# Patient Record
Sex: Male | Born: 1950 | Race: White | Hispanic: No | Marital: Married | Smoking: Former smoker
Health system: Southern US, Community
[De-identification: ages and names within clinical notes are randomized; demographics above are authoritative.]

## PROBLEM LIST (undated history)

## (undated) DIAGNOSIS — M109 Gout, unspecified: Secondary | ICD-10-CM

## (undated) DIAGNOSIS — J151 Pneumonia due to Pseudomonas: Secondary | ICD-10-CM

## (undated) DIAGNOSIS — C439 Malignant melanoma of skin, unspecified: Secondary | ICD-10-CM

## (undated) DIAGNOSIS — A419 Sepsis, unspecified organism: Secondary | ICD-10-CM

## (undated) DIAGNOSIS — G9341 Metabolic encephalopathy: Secondary | ICD-10-CM

## (undated) DIAGNOSIS — R6520 Severe sepsis without septic shock: Secondary | ICD-10-CM

## (undated) DIAGNOSIS — K8591 Acute pancreatitis with uninfected necrosis, unspecified: Secondary | ICD-10-CM

## (undated) DIAGNOSIS — I10 Essential (primary) hypertension: Secondary | ICD-10-CM

## (undated) DIAGNOSIS — I482 Chronic atrial fibrillation, unspecified: Secondary | ICD-10-CM

## (undated) DIAGNOSIS — E785 Hyperlipidemia, unspecified: Secondary | ICD-10-CM

## (undated) DIAGNOSIS — I480 Paroxysmal atrial fibrillation: Secondary | ICD-10-CM

## (undated) DIAGNOSIS — K219 Gastro-esophageal reflux disease without esophagitis: Secondary | ICD-10-CM

## (undated) DIAGNOSIS — J9621 Acute and chronic respiratory failure with hypoxia: Secondary | ICD-10-CM

## (undated) DIAGNOSIS — E039 Hypothyroidism, unspecified: Secondary | ICD-10-CM

## (undated) HISTORY — DX: Hypothyroidism, unspecified: E03.9

## (undated) HISTORY — DX: Hyperlipidemia, unspecified: E78.5

## (undated) HISTORY — DX: Gastro-esophageal reflux disease without esophagitis: K21.9

## (undated) HISTORY — PX: SHOULDER SURGERY: SHX246

## (undated) HISTORY — DX: Gout, unspecified: M10.9

## (undated) HISTORY — DX: Essential (primary) hypertension: I10

## (undated) HISTORY — DX: Paroxysmal atrial fibrillation: I48.0

## (undated) HISTORY — PX: NO PAST SURGERIES: SHX2092

## (undated) HISTORY — PX: FRACTURE SURGERY: SHX138

---

## 2010-05-26 ENCOUNTER — Ambulatory Visit: Payer: Self-pay | Admitting: Internal Medicine

## 2010-06-04 ENCOUNTER — Ambulatory Visit: Payer: Self-pay | Admitting: Internal Medicine

## 2010-06-04 ENCOUNTER — Ambulatory Visit (HOSPITAL_COMMUNITY)
Admission: RE | Admit: 2010-06-04 | Discharge: 2010-06-04 | Payer: Self-pay | Source: Home / Self Care | Admitting: Internal Medicine

## 2010-10-22 ENCOUNTER — Encounter (HOSPITAL_BASED_OUTPATIENT_CLINIC_OR_DEPARTMENT_OTHER): Payer: Managed Care, Other (non HMO) | Admitting: Internal Medicine

## 2010-10-22 ENCOUNTER — Other Ambulatory Visit (INDEPENDENT_AMBULATORY_CARE_PROVIDER_SITE_OTHER): Payer: Self-pay | Admitting: Internal Medicine

## 2010-10-22 ENCOUNTER — Ambulatory Visit (HOSPITAL_COMMUNITY)
Admission: RE | Admit: 2010-10-22 | Discharge: 2010-10-22 | Disposition: A | Discharge status: Home / Self Care | Payer: Managed Care, Other (non HMO) | Source: Ambulatory Visit | Attending: Internal Medicine | Admitting: Internal Medicine

## 2010-10-22 DIAGNOSIS — K2270 Barrett's esophagus without dysplasia: Secondary | ICD-10-CM | POA: Insufficient documentation

## 2010-10-22 DIAGNOSIS — K2210 Ulcer of esophagus without bleeding: Secondary | ICD-10-CM

## 2010-10-22 DIAGNOSIS — Z79899 Other long term (current) drug therapy: Secondary | ICD-10-CM | POA: Insufficient documentation

## 2010-10-22 DIAGNOSIS — R1310 Dysphagia, unspecified: Secondary | ICD-10-CM

## 2010-10-22 DIAGNOSIS — K222 Esophageal obstruction: Secondary | ICD-10-CM | POA: Insufficient documentation

## 2010-10-22 DIAGNOSIS — K449 Diaphragmatic hernia without obstruction or gangrene: Secondary | ICD-10-CM

## 2010-10-22 DIAGNOSIS — I10 Essential (primary) hypertension: Secondary | ICD-10-CM | POA: Insufficient documentation

## 2010-11-03 NOTE — Op Note (Signed)
NAMELATRAVIS, Andrew Compton             ACCOUNT NO.:  [id number]  MEDICAL RECORD NO.:  [id number]           PATIENT TYPE:  O  LOCATION:  DAYP                          FACILITY:  [facility]  PHYSICIAN:  Lionel December, M.D.    DATE OF BIRTH:  [birth year]  DATE OF PROCEDURE:  10/22/2010 DATE OF DISCHARGE:                              OPERATIVE REPORT   PROCEDURE:  Esophagogastroduodenoscopy with esophageal dilation.  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  The stricture was dilated to 16.5 mm with a balloon.  The patient also had Barrett's which was not biopsied because of ongoing inflammation.  He states his heartburn has this gone with double-dose PPI and antireflux measures, but he is still having intermittent solid food dysphagia, but not like he was having prior to his first EGD.  Procedures were reviewed with the patient.  Informed consent was obtained.  MEDICATIONS FOR CONSCIOUS SEDATION:  Cetacaine spray for pharyngeal topical anesthesia, Demerol 50 mg IV, Versed 6 mg IV.  FINDINGS:  Procedure performed in endoscopy suite.  The patient's vital signs and O2 sat were monitored during the procedure and remained stable.  The patient was placed in left lateral recumbent position and Pentax videoscope was passed through oropharynx without any difficulty into esophagus.  Esophagus:  Mucosa of the proximal segment was normal.  Proximal margin of Barrett's was noted to be at 32 cm from the incisors.  There were a couple of squamous islands in the midst of Barrett epithelium. Stricture who was noted just proximal to GE junction.  There was circumferential erosion ulcer at the level of the stricture.  I was able to pass the scope across.  GE junction was at 37 cm and hiatus at 40.  Stomach:  It was empty and distended very well with insufflation.  Folds of  proximal stomach are normal.  Examination of mucosa and body, antrum, pyloric channel, as well as fundus and cardia was normal.  Duodenum:  Bulbar and postbulbar mucosa was also normal.  Attention was then directed to esophageal stricture.  Balloon dilator was advanced through the scope.  Guide was placed in gastric lumen. Balloon dilator was positioned across the stricture and initially insufflator with diameter of 15 mm followed by 16.5 and finally to 18 mm.  I was able to pass the balloon distally in insufflated position. There was a significant increase in luminal diameter.  There was some oozing but no active brisk bleeding noted.  Finally, biopsy was taken from Barrett mucosa from distal and proximal segment and submitted in separate containers.  Endoscope was withdrawn.  The patient tolerated the procedure well.  FINAL DIAGNOSIS:  A 5-cm long Barrett epithelium.  Biopsy taken for histologic confirmation.  Esophageal stricture 1 cm proximal to GE junction involving the Barrett segment associated with circumferential superficial ulceration.  The stricture was dilated to 18 mm with a balloon.  A 3-cm size sliding hiatal hernia.  RECOMMENDATIONS:  He will continue antireflux measures and pantoprazole at 40 mg p.o. b.i.d.Marland Kitchen  I will be  contacting the patient with results of biopsy and plan to see him back in the office in 3 months.     Lionel December, M.D.     NR/MEDQ  D:  10/22/2010  T:  10/22/2010  Job:  [id number]  cc:   Fara Chute Fax: [phone]  Electronically Signed by Lionel December M.D. on 11/03/2010 01:56:32 PM

## 2011-01-25 ENCOUNTER — Ambulatory Visit (INDEPENDENT_AMBULATORY_CARE_PROVIDER_SITE_OTHER): Payer: Managed Care, Other (non HMO) | Admitting: Internal Medicine

## 2011-01-25 DIAGNOSIS — K219 Gastro-esophageal reflux disease without esophagitis: Secondary | ICD-10-CM

## 2011-01-25 DIAGNOSIS — K2270 Barrett's esophagus without dysplasia: Secondary | ICD-10-CM

## 2011-08-22 ENCOUNTER — Other Ambulatory Visit (INDEPENDENT_AMBULATORY_CARE_PROVIDER_SITE_OTHER): Payer: Self-pay | Admitting: Internal Medicine

## 2011-09-24 ENCOUNTER — Encounter: Payer: Self-pay | Admitting: *Deleted

## 2011-09-27 ENCOUNTER — Encounter: Payer: Self-pay | Admitting: Cardiology

## 2011-09-27 ENCOUNTER — Ambulatory Visit (INDEPENDENT_AMBULATORY_CARE_PROVIDER_SITE_OTHER): Payer: Managed Care, Other (non HMO) | Admitting: Cardiology

## 2011-09-27 VITALS — BP 142/94 | HR 88 | Resp 18 | Ht 72.0 in

## 2011-09-27 DIAGNOSIS — I340 Nonrheumatic mitral (valve) insufficiency: Secondary | ICD-10-CM | POA: Insufficient documentation

## 2011-09-27 DIAGNOSIS — I10 Essential (primary) hypertension: Secondary | ICD-10-CM

## 2011-09-27 DIAGNOSIS — I517 Cardiomegaly: Secondary | ICD-10-CM | POA: Insufficient documentation

## 2011-09-27 DIAGNOSIS — I059 Rheumatic mitral valve disease, unspecified: Secondary | ICD-10-CM

## 2011-09-27 DIAGNOSIS — I4891 Unspecified atrial fibrillation: Secondary | ICD-10-CM

## 2011-09-27 DIAGNOSIS — I442 Atrioventricular block, complete: Secondary | ICD-10-CM

## 2011-09-27 DIAGNOSIS — I480 Paroxysmal atrial fibrillation: Secondary | ICD-10-CM | POA: Insufficient documentation

## 2011-09-27 NOTE — Progress Notes (Signed)
Peyton Bottoms, MD, [facility] Board Certified in Adult Cardiovascular Medicine,Internal Medicine and Critical Care Medicine    CC: Evaluation of paroxysmal atrial fibrillation  HPI:  The patient is 61 year old male referred by Dr. Neita Carp after routine visit demonstrated atrial fibrillation with rapid ventricular response. The patient was asymptomatic. Reportedly 2011 he has been diagnosed with LVH less hypertrophic cardiomyopathy with mild to moderate outflow gradient. However this was in the setting of an acute illness. He also had systolic anterior motion of the mitral valve leaflets with moderate mitral regurgitation. The patient however is entirely asymptomatic. He has normal exercise tolerance. He denies any chest pain orthopnea PND. He has been troubled with sinus infections and is followed by Dr. Andrey Campanile. He does take some occasional over-the-counter decongestants. He has lost 30 pounds over the last several years with an exercise program. He reports no palpitations, presyncope or syncope. The patient states that he was not aware that he was in atrial fibrillation.His CHADS2 score is 1. Dr. Neita Carp stopped hydrochlorothiazide and start him on metoprolol. However the patient has not started the latter medication yet and is awaiting his mail order prescription. Lisinopril is being continued the patient today is hypertensive. The patient states that in the past he used to drink quite a bit of caffeinated beverages. He also drinks a monster drink every night.  PMH: reviewed and listed in Problem List in Electronic Records (and see below) Past Medical History  Diagnosis Date  . Essential hypertension, benign   . Hypothyroidism   . Other and unspecified hyperlipidemia   . Gouty arthropathy, unspecified   . Undiagnosed cardiac murmurs   . GERD (gastroesophageal reflux disease)   . Left ventricular hypertrophy      questionable hypertrophic cardiomyopathy echocardiogram 2011 mean gradient  across the outflow tracts 27 mmHg and peak gradient 46 mm of mercury, asymptomatic apparently systolic anterior motion of the mitral valve causing dynamic out flow obstruction. 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 Paroxysmal atrial fibrillation    No past surgical history on file.  Allergies/SH/FHX : available in Electronic Records for review  Allergies  Allergen Reactions  . Penicillins    History   Social History  . Marital Status: Married    Spouse Name: N/A    Number of Children: N/A  . Years of Education: N/A   Occupational History  .      [employer]   Social History Main Topics  . Smoking status: Unknown If Ever Smoked  . Smokeless tobacco: Not on file  . Alcohol Use: Not on file  . Drug Use: Not on file  . Sexually Active: Not on file   Other Topics Concern  . Not on file   Social History Narrative   Has no children   Family History  Problem Relation Age of Onset  . Breast cancer Mother   . Other Father     unknown death cause    Medications: Current Outpatient Prescriptions  Medication Sig Dispense Refill  . Ascorbic Acid (VITAMIN C) 1000 MG tablet Take 1,000 mg by mouth daily.      Marland Kitchen aspirin 81 MG tablet Take 81 mg by mouth daily.      . Biotin 5000 MCG CAPS Take 1 capsule by mouth daily.      . cetirizine (ZYRTEC) 10 MG tablet Take 10 mg by mouth daily.      . colchicine 0.6 MG tablet Take 0.6  mg by mouth daily.      . febuxostat (ULORIC) 40 MG tablet Take 40 mg by mouth daily.      . fish oil-omega-3 fatty acids 1000 MG capsule Take 2 g by mouth daily.      . hydrochlorothiazide (HYDRODIURIL) 25 MG tablet Take 25 mg by mouth daily.      Marland Kitchen levothyroxine (SYNTHROID, LEVOTHROID) 50 MCG tablet Take 50 mcg by mouth daily.      Marland Kitchen lisinopril (PRINIVIL,ZESTRIL) 40 MG tablet Take 40 mg by mouth daily.      . metoprolol tartrate (LOPRESSOR) 25 MG tablet Take 25 mg by mouth 2 (two) times daily.      .  mometasone (NASONEX) 50 MCG/ACT nasal spray Place 2 sprays into the nose daily.      . naproxen sodium (ALEVE) 220 MG tablet Take 220 mg by mouth 2 (two) times daily with a meal.      . pantoprazole (PROTONIX) 40 MG tablet       . ranitidine (ZANTAC) 150 MG tablet Take 150 mg by mouth 2 (two) times daily.        ROS: No nausea or vomiting. No fever or chills.No melena or hematochezia.No bleeding.No claudication  Physical Exam: BP 142/94  Pulse 88  Resp 18  Ht 6' (1.829 m) General: Well-nourished white male in no apparent distress Neck: Normal carotid upstroke no carotid bruits. No thyromegaly nonnodular thyroid. JVP is 5 cm. No spike and dome on exam Lungs: Clear breath sounds bilaterally no wheezing Cardiac: Regular rate and rhythm with normal S1-S2. No pathological murmur of mitral regurgitation. I had the patient do Valsalva and I could not hear a dynamic outflow murmur Vascular: No edema. Normal distal pulses bilaterally Skin: Warm and dry Physcologic: Normal affect  12lead ECG: Normal sinus rhythm no evidence of left ventricular hypertrophy. Limited bedside ECHO:N/A    Patient Active Problem List  Diagnoses  . Paroxysmal atrial fibrillation-currently normal sinus rhythm at low risk for thromboembolic disease   . Left ventricular hypertrophy-rule out hypertrophic cardiomyopathy   . Essential hypertension, benign Hypothyroidism  Mitral regurgitation-previously documented has moderate     PLAN   On physical examination I could not hear a significant pathologic murmur of mitral regurgitation. We will order a followup echocardiogram to reassess mitral regurgitation and also to evaluate the possibility of hypertrophic cardiomyopathy. On physical exam there was little evidence with no pathological murmur during squatting and standing. There is also no spike and dome in the carotid pulse.  Agree with Dr. Neita Carp to continue metoprolol which the patient has not started  yet.  Agreed to discontinue hydrochlorothiazide with close monitoring of his blood pressure and continuation of lisinopril.  CHADS2 score is low at 1 and therefore I do not recommend any formal anticoagulation with Coumadin or one of the newer oral anticoagulants. Aspirin 81 mg by mouth daily seems reasonable.  I had a discussion with the patient regarding the implications of mitral regurgitation, possible hypertrophic cardiomyopathy and paroxysmal atrial fibrillation and he was given patient information on his condition.  Unless the echocardiogram is significantly abnormal and or the patient has symptomatic atrial fibrillation the patient can be seen back in 6 months.  I did recommend to stop caffeinated beverages.

## 2011-09-27 NOTE — Patient Instructions (Signed)
Your physician recommends that you schedule a follow-up appointment in: 6 months. You will receive a reminder letter in the mail about 1-2 months in advance. If you don't receive this letter, please contact our office.  Your physician has requested that you have an echocardiogram. Echocardiography is a painless test that uses sound waves to create images of your heart. It provides your doctor with information about the size and shape of your heart and how well your heart's chambers and valves are working. This procedure takes approximately one hour. There are no restrictions for this procedure.  Your physician recommends that you continue on your current medications as directed. Please refer to the Current Medication list given to you today.

## 2011-10-07 ENCOUNTER — Other Ambulatory Visit: Payer: Managed Care, Other (non HMO)

## 2011-10-14 ENCOUNTER — Other Ambulatory Visit (INDEPENDENT_AMBULATORY_CARE_PROVIDER_SITE_OTHER): Payer: Managed Care, Other (non HMO)

## 2011-10-14 ENCOUNTER — Other Ambulatory Visit: Payer: Self-pay

## 2011-10-14 DIAGNOSIS — I059 Rheumatic mitral valve disease, unspecified: Secondary | ICD-10-CM

## 2011-10-14 DIAGNOSIS — I442 Atrioventricular block, complete: Secondary | ICD-10-CM

## 2011-10-14 DIAGNOSIS — I4891 Unspecified atrial fibrillation: Secondary | ICD-10-CM

## 2011-10-15 ENCOUNTER — Encounter: Payer: Self-pay | Admitting: *Deleted

## 2012-01-05 ENCOUNTER — Encounter (INDEPENDENT_AMBULATORY_CARE_PROVIDER_SITE_OTHER): Payer: Self-pay | Admitting: *Deleted

## 2012-01-25 ENCOUNTER — Ambulatory Visit (INDEPENDENT_AMBULATORY_CARE_PROVIDER_SITE_OTHER): Payer: Managed Care, Other (non HMO) | Admitting: Internal Medicine

## 2012-01-25 ENCOUNTER — Encounter (INDEPENDENT_AMBULATORY_CARE_PROVIDER_SITE_OTHER): Payer: Self-pay | Admitting: Internal Medicine

## 2012-01-25 VITALS — BP 110/80 | HR 76 | Temp 98.0°F | Ht 72.0 in | Wt 218.5 lb

## 2012-01-25 DIAGNOSIS — R1310 Dysphagia, unspecified: Secondary | ICD-10-CM | POA: Insufficient documentation

## 2012-01-25 DIAGNOSIS — K2270 Barrett's esophagus without dysplasia: Secondary | ICD-10-CM | POA: Insufficient documentation

## 2012-01-25 NOTE — Progress Notes (Signed)
Subjective:     Patient ID: Andrew Compton, male   DOB: 10-09-1950, 61 y.o.   MRN: [id number]  HPI Nico is a 61 yr old male here today for follow up of chronic GERD reflux disease complicated by Barrett's esophagus.diagnosed in November os 2011.  A 5 cm long segment of Barrett's. Small ulcer at the junction of Barrett's. Esophageal stricture with ring shaped ulceration 1 cm proximal to BE junction, which was dilated EGD 10/22/2010 Biopsy: Distal Barrett's. No intestinal metaplasia, H. Pylori, dysplasia or malignancy. Intestinal metaplasia consistent with Barrett's esophagus. No dysplasia or malignancy identified.  Says he is doing pretty good. He is chewing his food well.  Red meats bother him. Says swallowing is 90% better than it use to be.   No acid reflux. Appetite is good. No weight loss. Usually has 1-3 BMs a day     Review of Systems see hpi.- Current Outpatient Prescriptions  Medication Sig Dispense Refill  . Ascorbic Acid (VITAMIN C) 1000 MG tablet Take 1,000 mg by mouth daily.      . Biotin 5000 MCG CAPS Take 1 capsule by mouth daily.      . cetirizine (ZYRTEC) 10 MG tablet Take 10 mg by mouth daily.      . colchicine 0.6 MG tablet Take 0.6 mg by mouth as needed.       . febuxostat (ULORIC) 40 MG tablet Take 40 mg by mouth daily.      . fish oil-omega-3 fatty acids 1000 MG capsule Take 2 g by mouth daily.      . fluticasone (FLONASE) 50 MCG/ACT nasal spray Place 2 sprays into the nose as needed.      . hydrochlorothiazide (HYDRODIURIL) 25 MG tablet Take 25 mg by mouth daily.      Marland Kitchen levothyroxine (SYNTHROID, LEVOTHROID) 50 MCG tablet Take 50 mcg by mouth daily.      Marland Kitchen lisinopril (PRINIVIL,ZESTRIL) 40 MG tablet Take 40 mg by mouth daily.      . metoprolol tartrate (LOPRESSOR) 25 MG tablet Take 25 mg by mouth daily.       . mometasone (NASONEX) 50 MCG/ACT nasal spray Place 2 sprays into the nose daily.      . naproxen sodium (ALEVE) 220 MG tablet Take 220 mg by mouth 2 (two)  times daily with a meal.      . pantoprazole (PROTONIX) 40 MG tablet 40 mg daily.       Marland Kitchen triamcinolone cream (KENALOG) 0.1 % Apply topically as needed.      Marland Kitchen aspirin 81 MG tablet Take 81 mg by mouth daily.      . ranitidine (ZANTAC) 150 MG tablet Take 150 mg by mouth 2 (two) times daily.       Past Medical History  Diagnosis Date  . Essential hypertension, benign   . Hypothyroidism   . Other and unspecified hyperlipidemia   . Gouty arthropathy, unspecified   . Undiagnosed cardiac murmurs   . GERD (gastroesophageal reflux disease)   . Left ventricular hypertrophy      questionable hypertrophic cardiomyopathy echocardiogram 2011 mean gradient across the outflow tracts 27 mmHg and peak gradient 46 mm of mercury, asymptomatic apparently systolic anterior motion of the mitral valve causing dynamic out flow obstruction. 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 Paroxysmal atrial fibrillation    History reviewed. No pertinent past surgical history. Family Status  Relation Status Death Age  . Father Deceased   .  Mother Deceased    History   Social History  . Marital Status: Married    Spouse Name: N/A    Number of Children: N/A  . Years of Education: N/A   Occupational History  .      [employer]   Social History Main Topics  . Smoking status: Never Smoker   . Smokeless tobacco: Not on file  . Alcohol Use: Not on file  . Drug Use: Not on file  . Sexually Active: Not on file   Other Topics Concern  . Not on file   Social History Narrative   Has no children   Allergies as of 01/25/2012 - Review Complete 01/25/2012  Allergen Reaction Noted  . Penicillins  09/24/2011           Objective:   Physical Exam Filed Vitals:   01/25/12 1551  Height: 6' (1.829 m)  Weight: 218 lb 8 oz (99.111 kg)  Alert and oriented. Skin warm and dry. Oral mucosa is moist.   . Sclera anicteric, conjunctivae is pink. Thyroid not  enlarged. No cervical lymphadenopathy. Lungs clear. Heart regular rate and rhythm.  Abdomen is soft. Bowel sounds are positive. No hepatomegaly. No abdominal masses felt. No tenderness.  No edema to lower extremities.       Assessment:   Barrett esophagus. Dysphagia. Denies acid reflux at this time.   Dysphagia is 90% better.    0.Last EGD 10/2010.    Plan:    Continue Protonix. OV in 1 yr. Surveillance in 2 yrs.

## 2012-01-25 NOTE — Patient Instructions (Addendum)
OV in 1 yr.  Continue Protonix 

## 2012-05-28 ENCOUNTER — Other Ambulatory Visit (INDEPENDENT_AMBULATORY_CARE_PROVIDER_SITE_OTHER): Payer: Self-pay | Admitting: Internal Medicine

## 2013-01-03 ENCOUNTER — Encounter (INDEPENDENT_AMBULATORY_CARE_PROVIDER_SITE_OTHER): Payer: Self-pay | Admitting: *Deleted

## 2013-01-17 ENCOUNTER — Encounter (INDEPENDENT_AMBULATORY_CARE_PROVIDER_SITE_OTHER): Payer: Self-pay | Admitting: Internal Medicine

## 2013-01-17 ENCOUNTER — Ambulatory Visit (INDEPENDENT_AMBULATORY_CARE_PROVIDER_SITE_OTHER): Payer: Managed Care, Other (non HMO) | Admitting: Internal Medicine

## 2013-01-17 VITALS — BP 112/78 | HR 64 | Temp 98.3°F | Ht 72.0 in | Wt 231.1 lb

## 2013-01-17 DIAGNOSIS — K219 Gastro-esophageal reflux disease without esophagitis: Secondary | ICD-10-CM | POA: Insufficient documentation

## 2013-01-17 DIAGNOSIS — K2270 Barrett's esophagus without dysplasia: Secondary | ICD-10-CM

## 2013-01-17 NOTE — Progress Notes (Signed)
Subjective:     Patient ID: Andrew Compton, male   DOB: 1951/06/10, 62 y.o.   MRN: [id number]  HPI Here today for f/u of his Barrett's esophagus. He tells me he is doing good. He occasionally has dysphagia if he eats fast. Beef or some food that is hard will lodge. He will have to vomit it back up. Appetite is good. No weight loss. Takes Protonix once a day for his Barrett's esophagus.  He rarely takes the Zantac. Acid reflux for the most part is controlled. BMs are normal. Usually has a BM 2-3 times a day. No melena or bright red rectal bleeding.   10/22/2010 EGD: EGD 10/22/2010 Biopsy: Distal Barrett's. No intestinal metaplasia, H. Pylori, dysplasia or malignancy. Intestinal metaplasia consistent with Barrett's esophagus. No dysplasia or malignancy identified.   06/04/2010 EGD with ED followed by a screening colonoscopy Final diagnosis: a 5 cm long segment of Barrett's to be confirmed. Small ulcer at the junction of Barrett's of squamous epithelium and one above it.  Esophageal stricture with ring shaped ulceration  1 cm proximal to GE junction, which was dilated to 16.5 mm with the balloon. Colonoscopy: Normal ileum. Two small polyps ablated. Biopsy: Hyperplastic polyps. No evidence of malignancy,  .  Review of Systems see hpi Current Outpatient Prescriptions  Medication Sig Dispense Refill  . Ascorbic Acid (VITAMIN C) 1000 MG tablet Take 1,000 mg by mouth daily.      . cetirizine (ZYRTEC) 10 MG tablet Take 10 mg by mouth as needed.       . colchicine 0.6 MG tablet Take 0.6 mg by mouth as needed.       . febuxostat (ULORIC) 40 MG tablet Take 40 mg by mouth daily.      . fluticasone (FLONASE) 50 MCG/ACT nasal spray Place 2 sprays into the nose as needed.      . hydrochlorothiazide (HYDRODIURIL) 25 MG tablet Take 25 mg by mouth daily.      Marland Kitchen levothyroxine (SYNTHROID, LEVOTHROID) 50 MCG tablet Take 50 mcg by mouth daily.      Marland Kitchen lisinopril (PRINIVIL,ZESTRIL) 40 MG tablet Take 40 mg by mouth  daily.      . metoprolol tartrate (LOPRESSOR) 25 MG tablet Take 25 mg by mouth daily.       . nabumetone (RELAFEN) 500 MG tablet Take 500 mg by mouth 2 (two) times daily.      . naproxen sodium (ALEVE) 220 MG tablet Take 220 mg by mouth 2 (two) times daily with a meal.      . pantoprazole (PROTONIX) 40 MG tablet 40 mg daily.       . pantoprazole (PROTONIX) 40 MG tablet TAKE 1 TABLET TWICE A DAY  180 tablet  3  . ranitidine (ZANTAC) 150 MG tablet Take 150 mg by mouth 2 (two) times daily.      Marland Kitchen triamcinolone cream (KENALOG) 0.1 % Apply topically as needed.      . fish oil-omega-3 fatty acids 1000 MG capsule Take 2 g by mouth daily.       No current facility-administered medications for this visit.   Past Medical History  Diagnosis Date  . Essential hypertension, benign   . Hypothyroidism   . Other and unspecified hyperlipidemia   . Gouty arthropathy, unspecified   . Undiagnosed cardiac murmurs   . GERD (gastroesophageal reflux disease)   . Left ventricular hypertrophy      questionable hypertrophic cardiomyopathy echocardiogram 2011 mean gradient across the outflow tracts 27 mmHg  and peak gradient 46 mm of mercury, asymptomatic apparently systolic anterior motion of the mitral valve causing dynamic out flow obstruction. 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 Paroxysmal atrial fibrillation    History reviewed. No pertinent past surgical history. Allergies  Allergen Reactions  . Penicillins         Objective:   Physical Exam  Filed Vitals:   01/17/13 1437  BP: 112/78  Pulse: 64  Temp: 98.3 F (36.8 C)  Height: 6' (1.829 m)  Weight: 231 lb 1.6 oz (104.826 kg)    Alert and oriented. Skin warm and dry. Oral mucosa is moist.   . Sclera anicteric, conjunctivae is pink. Thyroid not enlarged. No cervical lymphadenopathy. Lungs clear. Heart regular rate and rhythm.  Abdomen is soft. Bowel sounds are positive. No hepatomegaly. No  abdominal masses felt. No tenderness.  No edema to lower extremities.       Assessment:    Barrett's esophagus. GERD under control at this time.     Plan:     OV in 1 yr. Continue the Protonix.  Next EGD will be in 2015.

## 2013-01-17 NOTE — Patient Instructions (Addendum)
Continue the Protonix. OV in 1 year.  

## 2013-03-28 DIAGNOSIS — M25519 Pain in unspecified shoulder: Secondary | ICD-10-CM

## 2013-04-06 ENCOUNTER — Inpatient Hospital Stay (HOSPITAL_COMMUNITY)
Admission: AD | Admit: 2013-04-06 | Discharge: 2013-04-09 | DRG: 074 | Disposition: A | Discharge status: Home / Self Care | Payer: Worker's Compensation | Source: Ambulatory Visit | Attending: Orthopedic Surgery | Admitting: Orthopedic Surgery

## 2013-04-06 DIAGNOSIS — S14109A Unspecified injury at unspecified level of cervical spinal cord, initial encounter: Secondary | ICD-10-CM

## 2013-04-06 DIAGNOSIS — W108XXA Fall (on) (from) other stairs and steps, initial encounter: Secondary | ICD-10-CM | POA: Diagnosis present

## 2013-04-06 DIAGNOSIS — R29898 Other symptoms and signs involving the musculoskeletal system: Secondary | ICD-10-CM | POA: Diagnosis present

## 2013-04-06 DIAGNOSIS — Z79899 Other long term (current) drug therapy: Secondary | ICD-10-CM

## 2013-04-06 DIAGNOSIS — M21339 Wrist drop, unspecified wrist: Secondary | ICD-10-CM | POA: Diagnosis present

## 2013-04-06 DIAGNOSIS — Y990 Civilian activity done for income or pay: Secondary | ICD-10-CM

## 2013-04-06 DIAGNOSIS — I517 Cardiomegaly: Secondary | ICD-10-CM | POA: Diagnosis present

## 2013-04-06 DIAGNOSIS — G540 Brachial plexus disorders: Secondary | ICD-10-CM | POA: Diagnosis present

## 2013-04-06 DIAGNOSIS — I10 Essential (primary) hypertension: Secondary | ICD-10-CM | POA: Diagnosis present

## 2013-04-06 DIAGNOSIS — M5090 Cervical disc disorder, unspecified, unspecified cervical region: Secondary | ICD-10-CM | POA: Diagnosis present

## 2013-04-06 DIAGNOSIS — M109 Gout, unspecified: Secondary | ICD-10-CM | POA: Diagnosis present

## 2013-04-06 DIAGNOSIS — E039 Hypothyroidism, unspecified: Secondary | ICD-10-CM | POA: Diagnosis present

## 2013-04-06 DIAGNOSIS — Z87891 Personal history of nicotine dependence: Secondary | ICD-10-CM

## 2013-04-06 DIAGNOSIS — E785 Hyperlipidemia, unspecified: Secondary | ICD-10-CM | POA: Diagnosis present

## 2013-04-06 LAB — BASIC METABOLIC PANEL
BUN: 49 mg/dL — ABNORMAL HIGH (ref 6–23)
CO2: 25 mEq/L (ref 19–32)
Calcium: 10.1 mg/dL (ref 8.4–10.5)
Chloride: 98 mEq/L (ref 96–112)
Creatinine, Ser: 1.19 mg/dL (ref 0.50–1.35)

## 2013-04-06 LAB — CBC
HCT: 40.6 % (ref 39.0–52.0)
MCH: 32 pg (ref 26.0–34.0)
MCHC: 36 g/dL (ref 30.0–36.0)
MCV: 89 fL (ref 78.0–100.0)
Platelets: 289 10*3/uL (ref 150–400)
RDW: 12.7 % (ref 11.5–15.5)

## 2013-04-06 MED ORDER — SODIUM CHLORIDE 0.9 % IJ SOLN: 3.0000 mL | Freq: Two times a day (BID) | INTRAMUSCULAR | Status: DC

## 2013-04-06 MED ORDER — HYDROCODONE-ACETAMINOPHEN 5-325 MG PO TABS
1.0000 | ORAL_TABLET | ORAL | Status: DC | PRN
  Administered 2013-04-06 – 2013-04-09 (×5): 1 via ORAL
  Filled 2013-04-06 (×5): qty 1

## 2013-04-06 MED ORDER — SODIUM CHLORIDE 0.9 % IV SOLN
250.0000 mL | INTRAVENOUS | Status: DC | PRN
  Administered 2013-04-06: 250 mL via INTRAVENOUS

## 2013-04-06 MED ORDER — ONDANSETRON HCL 4 MG/2ML IJ SOLN: 4.0000 mg | Freq: Four times a day (QID) | INTRAMUSCULAR | Status: DC | PRN

## 2013-04-06 MED ORDER — GABAPENTIN 100 MG PO CAPS
100.0000 mg | ORAL_CAPSULE | Freq: Three times a day (TID) | ORAL | Status: DC
  Administered 2013-04-06 – 2013-04-09 (×8): 100 mg via ORAL
  Filled 2013-04-06 (×11): qty 1

## 2013-04-06 MED ORDER — ASPIRIN EC 81 MG PO TBEC
81.0000 mg | DELAYED_RELEASE_TABLET | Freq: Every day | ORAL | Status: DC
  Administered 2013-04-07 – 2013-04-09 (×4): 81 mg via ORAL
  Filled 2013-04-06 (×4): qty 1

## 2013-04-06 MED ORDER — SODIUM CHLORIDE 0.9 % IV SOLN: 250.0000 mL | INTRAVENOUS | Status: DC | PRN

## 2013-04-06 MED ORDER — DEXAMETHASONE SODIUM PHOSPHATE 4 MG/ML IJ SOLN
8.0000 mg | Freq: Four times a day (QID) | INTRAMUSCULAR | Status: DC
  Administered 2013-04-06 – 2013-04-09 (×11): 8 mg via INTRAVENOUS
  Filled 2013-04-06 (×15): qty 2

## 2013-04-06 MED ORDER — SODIUM CHLORIDE 0.9 % IJ SOLN: 3.0000 mL | INTRAMUSCULAR | Status: DC | PRN

## 2013-04-06 MED ORDER — ONDANSETRON HCL 4 MG PO TABS: 4.0000 mg | ORAL_TABLET | Freq: Four times a day (QID) | ORAL | Status: DC | PRN

## 2013-04-06 NOTE — H&P (Signed)
History of Present Illness The patient is a 62 year old male who presents with back pain. The patient is here today in referral from Andrew Compton. The patient reports neck & upper back symptoms which began 11 day(s) ago following a specific injury. The injury occurred at work due to a fall. and Symptoms include pain, decreased range of motion (in both shoulders), numbness, tingling and weakness. The patient describes the pain as sharp, aching and tingling. The patient feels as if the symptoms are worsening. Current treatment includes opioid analgesics and oral corticosteroids. Prior to being seen today the patient was previously evaluated [facility] (and Andrew Compton). Past evaluation has included CT of the lumbar spine (of upper spine) and MRI of the lumbar spine (of the entire spine). 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  Additional reason for visit:  Transition into careis described as the following: The patient is transitioning into care from another physician and a summary of care was reviewed .   Subjective Transcription  I was asked to see this patient by my partner, Andrew Compton. 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 He was injured at work due to a fall. The patient was walking down a narrow flight of steps, loss his footing because of grease on the steps, fell forward and struck his head, face and had a hyperextension injury to his neck. He was initially seen at [facility] and then went to [facility] and was discharged. He was seen yesterday by Andrew Compton who had great concerns about his neurological exam and had him seem by me today.  Allergies Penicillin V *PENICILLINS*   Family History Cancer. mother Diabetes Mellitus. father   Social History Alcohol use. current drinker; drinks beer; only occasionally per week Children. 2 Current work status. working full  time Drug/Alcohol Rehab (Currently). no Drug/Alcohol Rehab (Previously). no Exercise. Exercises weekly; does running / walking Illicit drug use. no Living situation. live with spouse Marital status. married Number of flights of stairs before winded. 4-5 Pain Contract. no Tobacco / smoke exposure. no Tobacco use. former smoker; smoke(d) 1 pack(s) per day   Medication History PredniSONE (Pak) (5MG  Tablet, 1 (one) Tablet Oral as directed, Taken starting 04/05/2013) Active. (as directed) Hydrocodone-Acetaminophen ( Oral) Specific dose unknown - Active. Nabumetone ( Oral) Specific dose unknown - Active. Hydrochlorothiazide (25MG  Tablet, Oral) Active. Fluticasone Propionate (50MCG/ACT Suspension, Nasal) Active. Lisinopril (40MG  Tablet, Oral) Active. Synthroid ( Tablet, Oral) Active. Uloric (40MG  Tablet, Oral) Active.   Past Surgical History Arthroscopy of Knee. left   Other Problems Gout High blood pressure Hypothyroidism   Objective Transcription  On exam today he is a pleasant gentleman who is alert and oriented times three. No headaches. No visual field disturbances. His hearing is intact. Cranial nerves II-XII are intact. He has marked weakness of the trapezius, deltoid, biceps, wrist extensors. His triceps are both 2/5. His wrist extensors are significantly weak. His grip strength is present but is also weak at 4/5. Triceps is 4/5. His lower extremities 5/5 throughout. Normal gait pattern. He does have some difficulty maintaining his balance but he is not frankly ataxic. He has a negative Babinski test, no clonus. Negative Hoffmann sign. Negative inverted brachioradialis reflex. No shortness of breath or chest pain. Abdomen is soft and nontender. No incontinence of bowel or bladder. He did have a laceration over the left eye that required stitches. He has  pain with cervical spine palpation and range of motion.    RADIOGRAPHS:  He had an MRI yesterday  which does not show any frank cord compression. There was multilevel mild degenerative changes and more of a congenital narrowing.    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 He is behaving much like a central cord syndrome. He states that he just started a Medrol Dosepak and he feels significant reduction in the electrical type shooting pains he has been having in the upper extremity but he still has them. At this point in time because of my concern I would like to admit him to the hospital for IV Decadron treatment and observation. I am also going to have the Neurology Service see him to rule out any other issues. At this point I do not see the need for acute surgical intervention.

## 2013-04-06 NOTE — Consult Note (Signed)
Reason for Consult: Weakness of right and left upper extremities following a recent fall.  HPI:                                                                                                                                          Andrew Compton is an 62 y.o. male history of hypertension, paroxysmal atrial fibrillation, LVH, hypothyroidism and gout, admitted for evaluation and management of weakness involving upper extremities. Symptoms began following a wall with an injury to his head neck and shoulders on 03/26/2013. Patient doesn't think he lost consciousness. He recalls having severe pain in the neck and shoulders as well as numbness in both upper extremities proximally and distally starting several hours after his injury. He was evaluated at more at hospital and subsequently by orthopedics at [facility]. In an MRI of his cervical spine and brain on 04/05/2013. Degenerative disc changes and osteophytes are noted at multiple levels. Mild spinal stenosis is noted at multiple levels as well. MRI of his brain showed slight atrophic changes as well as mild small vessel chronic changes but no acute intracranial abnormality. Patient was started on prednisone 60 mg per day on 04/05/2013 and has not had as much pain in his arms and shoulders today. Tingling sensation is less intense. Strength is not appreciably improved. He's had marked difficulty with use of his arms and hands. Inability to extend his wrists and difficulty with being able to lift his arms above his head. He has not experienced any weakness nor sensory changes involving lower extremities. He also has not had problems with control of bowel and bladder functions.   Past Medical History  Diagnosis Date  . Essential hypertension, benign   . Hypothyroidism   . Other and unspecified hyperlipidemia   . Gouty arthropathy, unspecified   . Undiagnosed cardiac murmurs   . GERD (gastroesophageal reflux disease)   . 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 Paroxysmal atrial fibrillation     No past surgical history on file.  Family History  Problem Relation Age of Onset  . Breast cancer Mother   . Other Father     unknown death cause    Social History:  reports that he has never smoked. He does not have any smokeless tobacco history on file. He reports that he does not drink alcohol or use illicit drugs.  Allergies  Allergen Reactions  . Penicillins     Unknown  Allergy since child    MEDICATIONS:  I have reviewed the patient's current medications. Prior to Admission:  Prescriptions prior to admission  Medication Sig Dispense Refill  . Ascorbic Acid (VITAMIN C) 1000 MG tablet Take 1,000 mg by mouth daily.      . cetirizine (ZYRTEC) 10 MG tablet Take 10 mg by mouth as needed for allergies.       Marland Kitchen colchicine 0.6 MG tablet Take 0.6 mg by mouth as needed (gout).       . febuxostat (ULORIC) 40 MG tablet Take 40 mg by mouth daily.      . fish oil-omega-3 fatty acids 1000 MG capsule Take 2 g by mouth daily.      . fluticasone (FLONASE) 50 MCG/ACT nasal spray Place 2 sprays into the nose as needed for rhinitis or allergies.       . hydrochlorothiazide (HYDRODIURIL) 25 MG tablet Take 25 mg by mouth daily.      Marland Kitchen HYDROcodone-acetaminophen (NORCO/VICODIN) 5-325 MG per tablet Take 1 tablet by mouth every 6 (six) hours as needed for pain.      Marland Kitchen ibuprofen (ADVIL,MOTRIN) 800 MG tablet Take 800 mg by mouth every 8 (eight) hours as needed for pain.      Marland Kitchen levothyroxine  (SYNTHROID, LEVOTHROID) 50 MCG tablet Take 50 mcg by mouth daily.      Marland Kitchen lisinopril (PRINIVIL,ZESTRIL) 40 MG tablet Take 40 mg by mouth daily.      . nabumetone (RELAFEN) 500 MG tablet Take 500 mg by mouth 2 (two) times daily as needed for pain.      . pantoprazole (PROTONIX) 40 MG tablet Take 40 mg by mouth daily.       . predniSONE (STERAPRED UNI-PAK) 5 MG TABS tablet Take 5-30 mg by mouth daily. 6 tablets on 04/05/13, 2 tablets 04/06/13. Told to stop taking while in hospital so they can do a drip here.      . triamcinolone cream (KENALOG) 0.1 % Apply 1 application topically as needed (dry legs).          ROS:                                                                                                                                       History obtained from the patient  General ROS: negative for - chills, fatigue, fever, night sweats, weight gain or weight loss Psychological ROS: negative for - behavioral disorder, hallucinations, memory difficulties, mood swings or suicidal ideation Ophthalmic ROS: negative for - blurry vision, double vision, eye pain or loss of vision ENT ROS: negative for - epistaxis, nasal discharge, oral lesions, sore throat, tinnitus or vertigo Allergy and Immunology ROS: negative for - hives or itchy/watery eyes Hematological and Lymphatic ROS: negative for - bleeding problems, bruising or swollen lymph nodes Endocrine ROS: negative for - galactorrhea, hair pattern changes, polydipsia/polyuria or temperature intolerance Respiratory ROS: negative for - cough, hemoptysis, shortness of breath or wheezing Cardiovascular  ROS: negative for - chest pain, dyspnea on exertion, edema or irregular heartbeat Gastrointestinal ROS: negative for - abdominal pain, diarrhea, hematemesis, nausea/vomiting or stool incontinence Genito-Urinary ROS: negative for - dysuria, hematuria, incontinence or urinary frequency/urgency Musculoskeletal ROS: negative for - joint swelling or muscular  weakness Neurological ROS: as noted in HPI Dermatological ROS: negative for rash and skin lesion changes   Blood pressure 150/87, pulse 93, temperature 97.6 F (36.4 C), resp. rate 18, SpO2 99.00%.   Neurologic Examination:                                                                                                      Mental Status: Alert, oriented, thought content appropriate.  Speech fluent without evidence of aphasia. Able to follow commands without difficulty. Cranial Nerves: II-Visual fields were normal. III/IV/VI-Pupils were equal and reacted. Extraocular movements were full and conjugate.    V/VII-no facial numbness and no facial weakness. VIII-normal. X-normal speech and symmetrical palatal movement. Motor: Right deltoid was 3/5 and left deltoid 4/5; right biceps was 3/5 and left biceps 0/5; right brachioradialis was 3/5 and left brachioradialis 2/5; right and left triceps were 5/5; right and left wrist extensors were 1/5; strength of intrinsic hand muscles was difficult to assess because of severe restrictive bilaterally. Strength of lower extremities was normal proximally and distally. Deep Tendon Reflexes: Absent in upper extremities; 2+ and symmetrical at the knees and 1+ and symmetrical at the ankles. Plantars: Equivocal but likely extensor on the right; flexor on the left. Cerebellar: Normal finger-to-nose testing impaired commensurate with bilateral upper extremity weakness.  No results found for this basename: cbc, bmp, coags, chol, tri, ldl, hga1c    Results for orders placed during the hospital encounter of 04/06/13 (from the past 48 hour(s))  CBC     Status: None   Collection Time    04/06/13  7:05 PM      Result Value Range   WBC 8.3  4.0 - 10.5 K/uL   RBC 4.56  4.22 - 5.81 MIL/uL   Hemoglobin 14.6  13.0 - 17.0 g/dL   HCT 16.1  09.6 - 04.5 %   MCV 89.0  78.0 - 100.0 fL   MCH 32.0  26.0 - 34.0 pg   MCHC 36.0  30.0 - 36.0 g/dL   RDW 40.9  81.1 - 91.4 %    Platelets 289  150 - 400 K/uL    No results found.   Assessment/Plan: 61 year old man presenting with proximal and distal weakness of both upper extremities as well as sensory changes and absent reflexes, consistent with bilateral multifocal acute brachial plexopathy. No clinical signs of cervical nor thoracic myelopathy noted.  Recommendations: 1. Agree with plans for parenteral steroid treatment with Decadron. 2. Symptomatic treatment of pain as needed with analgesic medication. A trial of Neurontin may be helpful as well starting at 100 mg 3 times a day and increasing incrementally as needed. 3. MRI of right and left brachial plexus 4. Physical therapy and occupational therapy consults 5. Cockup splints for right and left wrists   We will  continue to follow this patient closely with you.  C.R. Roseanne Reno, MD [facility] [phone]  04/06/2013, 8:06 PM

## 2013-04-07 ENCOUNTER — Inpatient Hospital Stay (HOSPITAL_COMMUNITY): Payer: Worker's Compensation

## 2013-04-07 MED ORDER — LEVOTHYROXINE SODIUM 50 MCG PO TABS
50.0000 ug | ORAL_TABLET | Freq: Every day | ORAL | Status: DC
  Administered 2013-04-07 – 2013-04-09 (×3): 50 ug via ORAL
  Filled 2013-04-07 (×4): qty 1

## 2013-04-07 MED ORDER — COLCHICINE 0.6 MG PO TABS
0.6000 mg | ORAL_TABLET | ORAL | Status: DC | PRN
  Filled 2013-04-07: qty 1

## 2013-04-07 MED ORDER — FEBUXOSTAT 40 MG PO TABS
40.0000 mg | ORAL_TABLET | Freq: Every day | ORAL | Status: DC
  Administered 2013-04-07 – 2013-04-09 (×3): 40 mg via ORAL
  Filled 2013-04-07 (×4): qty 1

## 2013-04-07 MED ORDER — LISINOPRIL 40 MG PO TABS
40.0000 mg | ORAL_TABLET | Freq: Every day | ORAL | Status: DC
  Administered 2013-04-07 – 2013-04-09 (×3): 40 mg via ORAL
  Filled 2013-04-07 (×3): qty 1

## 2013-04-07 MED ORDER — SODIUM CHLORIDE 0.9 % IJ SOLN
3.0000 mL | Freq: Two times a day (BID) | INTRAMUSCULAR | Status: DC
  Administered 2013-04-07 – 2013-04-08 (×3): 3 mL via INTRAVENOUS

## 2013-04-07 MED ORDER — HYDROCHLOROTHIAZIDE 25 MG PO TABS
25.0000 mg | ORAL_TABLET | Freq: Every day | ORAL | Status: DC
  Administered 2013-04-07 – 2013-04-09 (×3): 25 mg via ORAL
  Filled 2013-04-07 (×3): qty 1

## 2013-04-07 MED ORDER — TRIAMCINOLONE ACETONIDE 0.1 % EX CREA: 1.0000 "application " | TOPICAL_CREAM | CUTANEOUS | Status: DC | PRN

## 2013-04-07 MED ORDER — FLUTICASONE PROPIONATE 50 MCG/ACT NA SUSP: 2.0000 | NASAL | Status: DC | PRN

## 2013-04-07 MED ORDER — PANTOPRAZOLE SODIUM 40 MG PO TBEC
40.0000 mg | DELAYED_RELEASE_TABLET | Freq: Every day | ORAL | Status: DC
  Administered 2013-04-07 – 2013-04-09 (×3): 40 mg via ORAL
  Filled 2013-04-07 (×3): qty 1

## 2013-04-07 NOTE — Progress Notes (Signed)
Subjective: No changes. He feels that he has nadired and is now improving some.   Exam: Filed Vitals:   04/07/13 1235  BP: 149/82  Pulse: 101  Temp: 98.5 F (36.9 C)  Resp: 14   Gen: In bed, NAD MS: Awake alert, oriented ZO:XWRUE, VFF, EOMI Motor: wrist drop bilaterally R > left, he has 4/5 interossei, 3/5 biceps.  Sensory:intact on left, mild numbness on outside of right forearms.  AVW:UJWJXB in biceps, BR, present in triceps, knees.    Impression: 63 yo M with unsual bilateral presentation most consistent with peripheral injury. The symmetry is concerning for a more central process, however otherwise his exam looks like a peripheral injury. I questino whether he is predisposed and fell with stretch injury bilaterally.   Recommendations: 1)MRI brachial plexus.   Ritta Slot, MD [facility] [phone]  If 7pm- 7am, please page neurology on call at [phone].

## 2013-04-07 NOTE — Progress Notes (Signed)
Orthopedic Tech Progress Note Patient Details:  Andrew Compton Jul 02, 1951 [id number]  Ortho Devices Type of Ortho Device: Velcro wrist splint Ortho Device/Splint Location: Bilateral wrist splints Ortho Device/Splint Interventions: Application   [name], Andrew Compton 04/07/2013, 10:12 AM

## 2013-04-07 NOTE — Evaluation (Signed)
Occupational Therapy Evaluation Patient Details Name: Andrew Compton MRN: [id number] DOB: 06/18/1951 Today's Date: 04/07/2013 Time: 4098-1191 OT Time Calculation (min): 28 min  OT Assessment / Plan / Recommendation History of present illness Pt fell about 2 weeks ago at work while going down some steep, narrow steps (knows that he hit his head about the left eyebrow and Bil elbows; landed on his feet at the bottom of the steps). About 24 hours after the injury he started developing pain in his shoulders and across his shoulder blades/neck with gradual  decreased use of Bil UEs   Clinical Impression   Pt admitted with above. Pt currently with functional limitations due to the deficits listed below (see OT Problem List). Pt will benefit from skilled OT to increase their safety and independence with ADL and functional mobility for ADL to facilitate discharge to venue listed below.       OT Assessment  Patient needs continued OT Services    Follow Up Recommendations  Outpatient OT       Equipment Recommendations  None recommended by OT       Frequency  Min 3X/week    Precautions / Restrictions Precautions Precautions: Cervical Required Braces or Orthoses: Cervical Brace;Other Brace/Splint Cervical Brace: Hard collar;At all times Other Brace/Splint: Bil wrist cock up splints Restrictions Weight Bearing Restrictions: No   Pertinent Vitals/Pain 1/10 in Bil shoulders    ADL  Eating/Feeding: Minimal assistance Where Assessed - Eating/Feeding: Chair Grooming: Minimal assistance Where Assessed - Grooming: Unsupported sitting Upper Body Bathing: Minimal assistance Where Assessed - Upper Body Bathing: Unsupported sitting Lower Body Bathing: Minimal assistance Where Assessed - Lower Body Bathing: Unsupported sit to stand Upper Body Dressing: Minimal assistance Where Assessed - Upper Body Dressing: Unsupported sitting Lower Body Dressing: Minimal assistance Where Assessed -  Lower Body Dressing: Unsupported sit to stand Toilet Transfer: Independent Toilet Transfer Method: Sit to Barista: Comfort height toilet Toileting - Clothing Manipulation and Hygiene: Minimal assistance Where Assessed - Engineer, mining and Hygiene: Sit to stand from 3-in-1 or toilet Tub/Shower Transfer: Independent Tub/Shower Transfer Method: Ambulating Equipment Used:  (cervical collar)    OT Diagnosis: Generalized weakness;Acute pain  OT Problem List: Decreased strength;Decreased range of motion;Decreased coordination;Impaired UE functional use;Pain OT Treatment Interventions: Self-care/ADL training;Therapeutic exercise;Therapeutic activities;Patient/family education;Splinting   OT Goals(Current goals can be found in the care plan section) Acute Rehab OT Goals Patient Stated Goal: To get full use of his hands back OT Goal Formulation: With patient Time For Goal Achievement: 04/14/13 Potential to Achieve Goals: Good  Visit Information  Last OT Received On: 04/07/13 Assistance Needed: +1 PT/OT Co-Evaluation/Treatment: Yes (partial) History of Present Illness: Pt fell about 2 weeks ago at work while going down some steep, narrow steps (knows that he hit his head about the left eyebrow and Bil elbows; landed on his feet at the bottom of the steps). About 24 hours after the injury he started developing pain in his shoulders and across his shoulder blades/neck with gradual  decreased use of Bil UEs       Prior Functioning     Home Living Family/patient expects to be discharged to:: Private residence Living Arrangements: Spouse/significant other Available Help at Discharge: Family;Available PRN/intermittently Type of Home: House Home Access: Level entry Home Layout: One level;Laundry or work area in [employer]: None Prior Function Level of Independence: Independent Communication Communication: No difficulties Dominant Hand:  Right         Vision/Perception Vision -  History Patient Visual Report: No change from baseline   Cognition  Cognition Arousal/Alertness: Awake/alert Behavior During Therapy: WFL for tasks assessed/performed Overall Cognitive Status: Within Functional Limits for tasks assessed    Extremity/Trunk Assessment Upper Extremity Assessment Upper Extremity Assessment: RUE deficits/detail;LUE deficits/detail RUE Deficits / Details: AROM flexion WFL; decreased AROM with compensatory moves to attempt extension; cannot extend fingers without flexion of wrist; using brachioradialis for elbow flexion RUE Coordination: decreased fine motor;decreased gross motor LUE Deficits / Details: AROM flexion WFL; decreased AROM with compensatory moves to attempt extension; cannot extend fingers without flexion of wrist; using brachioradialis for elbow flexion LUE Coordination: decreased fine motor;decreased gross motor     Mobility Bed Mobility Details for Bed Mobility Assistance: Pt up in recliner upon arrival Transfers Transfers: Sit to Stand;Stand to Sit Sit to Stand: 7: Independent;Without upper extremity assist;From chair/3-in-1 Stand to Sit: 7: Independent;Without upper extremity assist;To chair/3-in-1     Exercise Other Exercises Other Exercises: educated pt on palms together to help with Bil wrist extension and then to bring them apart slowly for control. Also educated pt on not swinging/throwing his arms at shoulders to get them to do what he wanted them to do but rather work on the control (ie: reaching up to try and wash his hair)      End of Session OT - End of Session Equipment Utilized During Treatment: Gait belt Activity Tolerance: Patient tolerated treatment well Patient left: in chair;with call bell/phone within reach Nurse Communication: Mobility status (safe to be up and walking in halls by himself)       Evette Georges [phone] 04/07/2013, 10:14 AM

## 2013-04-07 NOTE — Progress Notes (Signed)
    Subjective:     Patient reports pain as 1 on 0-10 scale.   Denies CP or SOB.  Voiding without difficulty. Positive flatus. Objective: Vital signs in last 24 hours: Temp:  [97.6 F (36.4 C)-98.2 F (36.8 C)] 98.1 F (36.7 C) (09/06 0539) Pulse Rate:  [93-98] 93 (09/06 0539) Resp:  [18] 18 (09/06 0539) BP: (122-150)/(81-99) 122/81 mmHg (09/06 0539) SpO2:  [95 %-99 %] 95 % (09/06 0539)  Intake/Output from previous day: 09/05 0701 - 09/06 0700 In: 720 [P.O.:720] Out: -  Intake/Output this shift: Total I/O In: 240 [P.O.:240] Out: -   Labs:  Recent Labs  04/06/13 1905  HGB 14.6    Recent Labs  04/06/13 1905  WBC 8.3  RBC 4.56  HCT 40.6  PLT 289    Recent Labs  04/06/13 1905  NA 137  K 3.6  CL 98  CO2 25  BUN 49*  CREATININE 1.19  GLUCOSE 162*  CALCIUM 10.1   No results found for this basename: LABPT, INR,  in the last 72 hours  Physical Exam: Intact pulses distally Dorsiflexion/Plantar flexion intact Compartment soft No change in neuro exam  Assessment/Plan: Continue IV decadron Agree with MRI of shoulder/brachial plexus to r/o peripheral nerve injury Continue OT Monitor progress - consider d/c Monday   Andrew Compton D for Dr. Venita Lick [facility] [phone] 04/07/2013, 9:41 AM    \

## 2013-04-07 NOTE — Evaluation (Signed)
Physical Therapy Evaluation Patient Details Name: Andrew Compton MRN: [id number] DOB: 09/22/1950 Today's Date: 04/07/2013 Time: 0821-0858 PT Time Calculation (min): 37 min  PT Assessment / Plan / Recommendation History of Present Illness  Pt fell at work ~ 2 weeks ago going down a narrow flight of stairs.  24 hours after the fall he started developing pain in bilat shlds, upper back, and BUE.  After the onset of pain, he noticed the gradual loss of UE strength/function.  Clinical Impression  Pt is independent with all mobility skills.  OT to address all UE and ADL needs. No further PT indicated.  Recommend OPOT upon d/c home.    PT Assessment  Patent does not need any further PT services    Follow Up Recommendations  No PT follow up    Does the patient have the potential to tolerate intense rehabilitation      Barriers to Discharge        Equipment Recommendations  None recommended by PT    Recommendations for Other Services     Frequency      Precautions / Restrictions Precautions Precautions: Cervical Required Braces or Orthoses: Cervical Brace Cervical Brace: Hard collar;At all times Other Brace/Splint: Bil wrist cock up splints Restrictions Weight Bearing Restrictions: No   Pertinent Vitals/Pain 0/10      Mobility  Bed Mobility Details for Bed Mobility Assistance: Pt up in recliner.  Reports independence with in/out of bed. Transfers Sit to Stand: 7: Independent;Without upper extremity assist;From chair/3-in-1 Stand to Sit: 7: Independent;Without upper extremity assist;To chair/3-in-1 Ambulation/Gait Ambulation/Gait Assistance: 7: Independent Assistive device: None Gait Pattern: Within Functional Limits Gait velocity: WFL    Exercises Other Exercises Other Exercises: educated pt on palms together to help with Bil wrist extension and then to bring them apart slowly for control. Also educated pt on not swinging/throwing his arms at shoulders to get them to  do what he wanted them to do but rather work on the control (ie: reaching up to try and wash his hair)   PT Diagnosis:    PT Problem List:   PT Treatment Interventions:       PT Goals(Current goals can be found in the care plan section) Acute Rehab PT Goals Patient Stated Goal: To get full use of his hands back  Visit Information  Last PT Received On: 04/07/13 Assistance Needed: +1 History of Present Illness: Pt fell at work ~ 2 weeks ago going down a narrow flight of stairs.  24 hours after the fall he started developing pain in bilat shlds, upper back, and BUE.  After the onset of pain, he noticed the gradual loss of UE strength/function.       Prior Functioning  Home Living Family/patient expects to be discharged to:: Private residence Living Arrangements: Spouse/significant other Available Help at Discharge: Family;Available PRN/intermittently Type of Home: House Home Access: Level entry Home Layout: One level Home Equipment: None Prior Function Level of Independence: Independent Communication Communication: No difficulties Dominant Hand: Right    Cognition  Cognition Arousal/Alertness: Awake/alert Behavior During Therapy: WFL for tasks assessed/performed Overall Cognitive Status: Within Functional Limits for tasks assessed    Extremity/Trunk Assessment Upper Extremity Assessment Upper Extremity Assessment: RUE deficits/detail;LUE deficits/detail RUE Deficits / Details: AROM flexion WFL; decreased AROM with compensatory moves to attempt extension; cannot extend fingers without flexion of wrist; using brachioradialis for elbow flexion RUE Coordination: decreased fine motor;decreased gross motor LUE Deficits / Details: AROM flexion WFL; decreased AROM with compensatory moves to  attempt extension; cannot extend fingers without flexion of wrist; using brachioradialis for elbow flexion LUE Coordination: decreased fine motor;decreased gross motor   Balance Balance Balance  Assessed: Yes Static Standing Balance Static Standing - Balance Support: No upper extremity supported Static Standing - Level of Assistance: 7: Independent Dynamic Standing Balance Dynamic Standing - Balance Support: No upper extremity supported Dynamic Standing - Level of Assistance: 7: Independent Dynamic Standing - Balance Activities: Reaching across midline;Lateral lean/weight shifting;Forward lean/weight shifting High Level Balance High Level Balance Activites: Side stepping;Backward walking;Direction changes;Turns  End of Session PT - End of Session Equipment Utilized During Treatment: Gait belt;Cervical collar Activity Tolerance: Patient tolerated treatment well Patient left: in chair;with call bell/phone within reach  GP     Ilda Foil 04/07/2013, 11:44 AM  Aida Raider, PT  Office # [phone] Pager [phone]

## 2013-04-08 ENCOUNTER — Other Ambulatory Visit (INDEPENDENT_AMBULATORY_CARE_PROVIDER_SITE_OTHER): Payer: Self-pay | Admitting: Internal Medicine

## 2013-04-08 NOTE — Progress Notes (Signed)
Subjective: No acute changes overnight.   Exam: Filed Vitals:   04/08/13 0500  BP: 120/86  Pulse: 115  Temp: 98 F (36.7 C)  Resp: 20   Gen: In bed, NAD MS: Awake, Alert,  HQ:IONGE EOMI Motor: 3/5 biceps, good grip, 0/5 wrist/finger extension. He has relatively preserved deltoid strength compared to biceps.  Sensory:Decreased over right axillary distribtuion, left medial upper arm decreased tem.  XBM:WUXLKG in biceps and BR.   MRI brachial plexi- report suggests mild edema of the right brachial plexus, limited by motion on teh left.   Impression: 62 yo M with bilateral arm weakness. The symmetry of the injury is still the most puzzling part for a peripheral distribution. He does have some mild stenosis of his C-spine, but the pattern appears more consistent with peripheral injury.   Recommendations: 1) Will discuss with orthopedics need for continued neck bracing.  2) would favor EMG as outpatient in 6 weeks from injury.   Ritta Slot, MD [facility] [phone]  If 7pm- 7am, please page neurology on call at [phone].

## 2013-04-08 NOTE — Progress Notes (Signed)
Occupational Therapy Treatment Patient Details Name: Andrew Compton MRN: [id number] DOB: [birth year] Today's Date: 04/08/2013 Time: 4098-1191 OT Time Calculation (min): 25 min  OT Assessment / Plan / Recommendation  History of present illness Pt fell at work ~ 2 weeks ago going down a narrow flight of stairs.  24 hours after the fall he started developing pain in bilat shlds, upper back, and BUE.  After the onset of pain, he noticed the gradual loss of UE strength/function.   OT comments  Progressing toward goals.  Follow Up Recommendations  Outpatient OT    Barriers to Discharge       Equipment Recommendations  None recommended by OT    Recommendations for Other Services    Frequency Min 3X/week   Progress towards OT Goals Progress towards OT goals: Progressing toward goals  Plan Discharge plan remains appropriate    Precautions / Restrictions Precautions Precautions: Cervical Required Braces or Orthoses: Cervical Brace Cervical Brace: Hard collar;At all times Other Brace/Splint: Bil wrist cock up splints   Pertinent Vitals/Pain See vitals    ADL  Eating/Feeding: Performed;Minimal assistance Where Assessed - Eating/Feeding: Chair Equipment Used:  (cervical collar) Transfers/Ambulation Related to ADLs: Focus of session on bil UE HEP and splinting.  Pt has been independently ambulating in unit throughout day. ADL Comments: Reviewed cervical precautions especially to avoid bending over sink or table which pt has been doing much of lately for compensatory strategies to stabilize UEs during grooming tasks. Pt able to donn/doff bil wrist cock up splints with min assist for L UE due to IV. No skin breakdown noted on bil UEs.  Pt requiring cueing to slow down movements and to focus on control, especially with self  PROM.     OT Diagnosis:    OT Problem List:   OT Treatment Interventions:     OT Goals(current goals can now be found in the care plan section) Acute Rehab OT  Goals Patient Stated Goal: To get full use of his hands back OT Goal Formulation: With patient Time For Goal Achievement: 04/14/13 Potential to Achieve Goals: Good ADL Goals Pt Will Perform Grooming: with modified independence;with adaptive equipment;standing Pt Will Perform Toileting - Clothing Manipulation and hygiene: with modified independence;sit to/from stand;with adaptive equipment Additional ADL Goal #1: Pt will be able to don and doff his Bil hand splints Additional ADL Goal #2: Pt will be Mod I wear and care of c-collar  Visit Information  Last OT Received On: 04/08/13 Assistance Needed: +1 History of Present Illness: Pt fell at work ~ 2 weeks ago going down a narrow flight of stairs.  24 hours after the fall he started developing pain in bilat shlds, upper back, and BUE.  After the onset of pain, he noticed the gradual loss of UE strength/function.    Subjective Data      Prior Functioning       Cognition  Cognition Arousal/Alertness: Awake/alert Behavior During Therapy: WFL for tasks assessed/performed Overall Cognitive Status: Within Functional Limits for tasks assessed    Mobility       Exercises  Other Exercises Other Exercises: Pt performed bil wrist extension with palms together but required verbal cueing for slow controlled technique. Bil wrist extension PROM and individiual digit flex/ext AAROM wiht therapist assist.   Balance     End of Session OT - End of Session Equipment Utilized During Treatment: Cervical collar Activity Tolerance: Patient tolerated treatment well Patient left: in chair;with call bell/phone within reach;with family/visitor present Nurse  Communication: Mobility status  GO    04/08/2013 Cipriano Mile OTR/L Pager [phone] Office [phone]  Cipriano Mile 04/08/2013, 3:11 PM

## 2013-04-08 NOTE — Progress Notes (Signed)
Subjective:     Patient reports pain as 2 on 0-10 scale.  Awaiting results of MRI of both shoulders.He is able to abduct to eighty degrees.  Objective: Vital signs in last 24 hours: Temp:  [98 F (36.7 C)-98.6 F (37 C)] 98 F (36.7 C) (09/07 0500) Pulse Rate:  [101-115] 115 (09/07 0500) Resp:  [14-20] 20 (09/07 0500) BP: (120-149)/(79-86) 120/86 mmHg (09/07 0500) SpO2:  [95 %-96 %] 96 % (09/07 0500)  Intake/Output from previous day: 09/06 0701 - 09/07 0700 In: 240 [P.O.:240] Out: -  Intake/Output this shift:     Recent Labs  04/06/13 1905  HGB 14.6    Recent Labs  04/06/13 1905  WBC 8.3  RBC 4.56  HCT 40.6  PLT 289    Recent Labs  04/06/13 1905  NA 137  K 3.6  CL 98  CO2 25  BUN 49*  CREATININE 1.19  GLUCOSE 162*  CALCIUM 10.1   No results found for this basename: LABPT, INR,  in the last 72 hours  Very weak abduction of both Shoulders.  Assessment/Plan:     Up with therapy  Mera Gunkel A 04/08/2013, 8:39 AM

## 2013-04-09 MED ORDER — METHOCARBAMOL 500 MG PO TABS: 500.0000 mg | ORAL_TABLET | Freq: Three times a day (TID) | ORAL | Status: DC | PRN

## 2013-04-09 MED ORDER — HYDROCODONE-ACETAMINOPHEN 5-325 MG PO TABS: 1.0000 | ORAL_TABLET | ORAL | Status: DC | PRN

## 2013-04-09 MED ORDER — METHYLPREDNISOLONE 4 MG PO TABS: ORAL_TABLET | ORAL | Status: DC

## 2013-04-09 NOTE — Progress Notes (Signed)
Occupational Therapy Treatment Patient Details Name: Andrew Compton MRN: [id number] DOB: [birth year] Today's Date: 04/09/2013 Time: 4098-1191 OT Time Calculation (min): 23 min  OT Assessment / Plan / Recommendation  History of present illness Pt fell at work ~ 2 weeks ago going down a narrow flight of stairs.  24 hours after the fall he started developing pain in bilat shlds, upper back, and BUE.  After the onset of pain, he noticed the gradual loss of UE strength/function.   OT comments  Pt provided with adapted utensils to aid with feeding and with HEP for bil. UEs.  Pt able to return demonstration of all.    Follow Up Recommendations  Outpatient OT    Barriers to Discharge       Equipment Recommendations  None recommended by OT    Recommendations for Other Services    Frequency Min 3X/week   Progress towards OT Goals Progress towards OT goals: Progressing toward goals  Plan Discharge plan remains appropriate    Precautions / Restrictions Precautions Precaution Comments: MD discontinued cervical collar while OT was in room Other Brace/Splint: Bil wrist cock up splints   Pertinent Vitals/Pain     ADL  Eating/Feeding: Set up Where Assessed - Eating/Feeding: Chair Upper Body Dressing: Moderate assistance Where Assessed - Upper Body Dressing: Unsupported sitting;Unsupported standing Lower Body Dressing: Supervision/safety ADL Comments: With splints on, pt able to feed self.  Curved spoon and fork provided to pt to assist as supination is limited which causes droppage.  Pt with improved accuracy with adapted utensils, in conjunction with the splints.  Pt able to don/doff socks with splints on and don elastic waist shorts.  He does, however, have wife assist him at times due to amount of time and effort it takes to complete tasks.  Discussed options for adapting zipper and buttons to increase ease of fastening.  Pt also instructed in HEP for bil. hands and UEs.  He was provided  with ball for hands and also encouraged active extension.  Cautioned him to only perform 10 reps shoulder exercises with t-band to avoid irritation/inflammation.  Level 2 theraband provided to pt.     OT Diagnosis:    OT Problem List:   OT Treatment Interventions:     OT Goals(current goals can now be found in the care plan section) Acute Rehab OT Goals Patient Stated Goal: To get full use of his hands back OT Goal Formulation: With patient Time For Goal Achievement: 04/14/13 Potential to Achieve Goals: Good ADL Goals Pt Will Perform Grooming: with modified independence;with adaptive equipment;standing Pt Will Perform Toileting - Clothing Manipulation and hygiene: with modified independence;sit to/from stand;with adaptive equipment Additional ADL Goal #1: Pt will be able to don and doff his Bil hand splints  Visit Information  Last OT Received On: 04/09/13 Assistance Needed: +1 History of Present Illness: Pt fell at work ~ 2 weeks ago going down a narrow flight of stairs.  24 hours after the fall he started developing pain in bilat shlds, upper back, and BUE.  After the onset of pain, he noticed the gradual loss of UE strength/function.    Subjective Data      Prior Functioning       Cognition  Cognition Arousal/Alertness: Awake/alert Behavior During Therapy: WFL for tasks assessed/performed Overall Cognitive Status: Within Functional Limits for tasks assessed    Mobility       Exercises      Balance     End of Session OT -  End of Session Activity Tolerance: Patient tolerated treatment well Patient left: in chair;with call bell/phone within reach;with family/visitor present Nurse Communication: Mobility status  GO     Andrew Compton, Andrew Compton 04/09/2013, 3:28 PM

## 2013-04-09 NOTE — Discharge Summary (Signed)
Physician Discharge Summary  Patient ID: Andrew Compton MRN: [id number] DOB/AGE: 01/10/1951 62 y.o.  Admit date: 04/06/2013 Discharge date: 04/09/2013  Admission Diagnoses:  Neck injury with bilat upper ext weakness  Discharge Diagnoses:  Active Problems:   Brachial plexopathy   Upper extremity weakness   Discharged Condition: stable  Hospital Course:  62 yo wm was admitted to the hospital for neck pain and  bilat ue weakness which was the result of a work related injury when he slipped down some stairs and hit his face/head causing a hyperextension injury to his neck.  Symptoms ongoing for a couple of weeks.  He has been stable during admission.  No complaints of lower extremity symptoms.  He is currently doing well and ready for discharge home.    Consults: neurology    Discharge Exam: Blood pressure 115/84, pulse 95, temperature 97.5 F (36.4 C), temperature source Oral, resp. rate 18, SpO2 100.00%.   Disposition: 01-Home or Self Care  Discharge Orders   Future Orders Complete By Expires   Call MD / Call 911  As directed    Comments:     If you experience chest pain or shortness of breath, CALL 911 and be transported to the hospital emergency room.  If you develope a fever above 101 F, pus (white drainage) or increased drainage or redness at the wound, or calf pain, call your surgeon's office.   Constipation Prevention  As directed    Comments:     Drink plenty of fluids.  Prune juice may be helpful.  You may use a stool softener, such as Colace (over the counter) 100 mg twice a day.  Use MiraLax (over the counter) for constipation as needed.   Diet - low sodium heart healthy  As directed    Discharge instructions  As directed    Comments:     Wear cervical collar for comfort if needed.  If you begin to have any worsening symptoms, call the office immediately.  We will arrange appointment with Dr Ethelene Hal for NCV/EMG study in for weeks and then recheck with Dr Shon Baton after to  discuss results.   Driving restrictions  As directed    Comments:     No driving until further notice.   Increase activity slowly as tolerated  As directed    Lifting restrictions  As directed    Comments:     No lifting until further notice.       Medication List    STOP taking these medications       fish oil-omega-3 fatty acids 1000 MG capsule     ibuprofen 800 MG tablet  Commonly known as:  ADVIL,MOTRIN     predniSONE 5 MG Tabs tablet  Commonly known as:  STERAPRED UNI-PAK      TAKE these medications       cetirizine 10 MG tablet  Commonly known as:  ZYRTEC  Take 10 mg by mouth as needed for allergies.     colchicine 0.6 MG tablet  Take 0.6 mg by mouth as needed (gout).     febuxostat 40 MG tablet  Commonly known as:  ULORIC  Take 40 mg by mouth daily.     fluticasone 50 MCG/ACT nasal spray  Commonly known as:  FLONASE  Place 2 sprays into the nose as needed for rhinitis or allergies.     hydrochlorothiazide 25 MG tablet  Commonly known as:  HYDRODIURIL  Take 25 mg by mouth daily.  HYDROcodone-acetaminophen 5-325 MG per tablet  Commonly known as:  NORCO/VICODIN  Take 1-2 tablets by mouth every 4 (four) hours as needed.     levothyroxine 50 MCG tablet  Commonly known as:  SYNTHROID, LEVOTHROID  Take 50 mcg by mouth daily.     lisinopril 40 MG tablet  Commonly known as:  PRINIVIL,ZESTRIL  Take 40 mg by mouth daily.     methocarbamol 500 MG tablet  Commonly known as:  ROBAXIN  Take 1 tablet (500 mg total) by mouth 3 (three) times daily with meals as needed (spasms).     methylPREDNISolone 4 MG tablet  Commonly known as:  MEDROL  6 days dose pack.  Take as directed.     nabumetone 500 MG tablet  Commonly known as:  RELAFEN  Take 500 mg by mouth 2 (two) times daily as needed for pain.     pantoprazole 40 MG tablet  Commonly known as:  PROTONIX  Take 40 mg by mouth daily.     triamcinolone cream 0.1 %  Commonly known as:  KENALOG  Apply 1  application topically as needed (dry legs).     vitamin C 1000 MG tablet  Take 1,000 mg by mouth daily.         SignedNaida Sleight 04/09/2013, 12:53 PM

## 2013-04-09 NOTE — Progress Notes (Signed)
Subjective: No changes.   Exam: Filed Vitals:   04/09/13 0604  BP: 115/84  Pulse: 95  Temp: 97.5 F (36.4 C)  Resp: 18   Gen: In bed, NAD  MS: Awake, Alert,  ZO:XWRUE EOMI  Motor: 3/5 biceps, good grip, 0/5 wrist/finger extension. He has relatively preserved deltoid strength compared to biceps.  Sensory:Decreased over right axillary distribtuion, left medial upper arm decreased tem.  AVW:UJWJXB in biceps and BR.   Impression: 62 yo M with bilateral arm weakness. The symmetry of the injury is still the most puzzling part for a peripheral distribution. He does have some mild stenosis of his C-spine, but the pattern appears more consistent with peripheral injury. With the symmetry of the lesion, however MRI negative C-spine injury I still do think needs to be considered.     Recommendations: 1) Would recommend EMG 6 weeks after injury 2) OT for splinting.   Ritta Slot, MD [facility] [phone]  If 7pm- 7am, please page neurology on call at [phone].

## 2013-04-09 NOTE — Progress Notes (Signed)
UR COMPLETED  

## 2013-04-09 NOTE — Progress Notes (Signed)
    Subjective:     Patient reports pain as 3 on 0-10 scale.   Denies CP or SOB.  Voiding without difficulty. Positive flatus. Objective: Vital signs in last 24 hours: Temp:  [97.5 F (36.4 C)-98.2 F (36.8 C)] 97.5 F (36.4 C) (09/08 0604) Pulse Rate:  [95-100] 95 (09/08 0604) Resp:  [18] 18 (09/08 0604) BP: (115-143)/(84-90) 115/84 mmHg (09/08 0604) SpO2:  [99 %-100 %] 100 % (09/08 0604)  Intake/Output from previous day: 09/07 0701 - 09/08 0700 In: 480 [P.O.:480] Out: -  Intake/Output this shift:    Labs:  Recent Labs  04/06/13 1905  HGB 14.6    Recent Labs  04/06/13 1905  WBC 8.3  RBC 4.56  HCT 40.6  PLT 289    Recent Labs  04/06/13 1905  NA 137  K 3.6  CL 98  CO2 25  BUN 49*  CREATININE 1.19  GLUCOSE 162*  CALCIUM 10.1   No results found for this basename: LABPT, INR,  in the last 72 hours  Physical Exam: Intact pulses distally Compartment soft no change in clinical exam  Assessment/Plan:     Up with therapy Plan on d/c today. In 4 weeks will set up EMG/NCS with Dr Ethelene Hal in my office - bilateral UE I will see patient back as outpatient following this study Will restart his medrol dose pack as steroid taper  Norco for pain control    Andrew Compton D for Dr. Venita Lick [facility] [phone] 04/09/2013, 12:08 PM

## 2013-04-09 NOTE — Progress Notes (Signed)
Occupational Therapy Treatment Patient Details Name: Andrew Compton MRN: [id number] DOB: 03-Feb-1951 Today's Date: 04/09/2013 Time: 4098-1191 OT Time Calculation (min): 211 min  OT Assessment / Plan / Recommendation  History of present illness Pt fell at work ~ 2 weeks ago going down a narrow flight of stairs.  24 hours after the fall he started developing pain in bilat shlds, upper back, and BUE.  After the onset of pain, he noticed the gradual loss of UE strength/function.   OT comments  Bil forearm based, low profile extensor assist splints fabricated to improve functional use of bil. UEs by assisting with wrist and digit extension.  Splints fitting well.  Pt is able to independently don/doff, and written instruction provided re: wear and care of these splints as well as wrist splints.   Follow Up Recommendations  Outpatient OT    Barriers to Discharge       Equipment Recommendations  None recommended by OT    Recommendations for Other Services    Frequency Min 3X/week   Progress towards OT Goals Progress towards OT goals: Progressing toward goals  Plan Discharge plan remains appropriate    Precautions / Restrictions Precautions Precaution Comments: MD discontinued cervical collar while OT was in room Other Brace/Splint: Bil wrist cock up splints   Pertinent Vitals/Pain k   ADL  ADL Comments: Bil. dynamic low profile, forearm based, extensor assist splints were fabricated for bil. hands. Splints fitting well.  Pt able to don/doff modified independently.  Pt and wife were verbally instrcuted in splint wear and care and written instruction provided.  With splints in place, pt able to pick up utensils, cup, etc.  He reports signficant improvement in hand function.   Pt instructed to wear dynamic splints to comfort, and to wear wrist splints at night and during the day when not wearing dynamic splints.      OT Diagnosis:    OT Problem List:   OT Treatment Interventions:     OT  Goals(current goals can now be found in the care plan section) Acute Rehab OT Goals Patient Stated Goal: To get full use of his hands back OT Goal Formulation: With patient Time For Goal Achievement: 04/14/13 Potential to Achieve Goals: Good ADL Goals Pt Will Perform Grooming: with modified independence;with adaptive equipment;standing Pt Will Perform Toileting - Clothing Manipulation and hygiene: with modified independence;sit to/from stand;with adaptive equipment Additional ADL Goal #1: Pt will be able to don and doff his Bil hand splints  Visit Information  Last OT Received On: 04/09/13 Assistance Needed: +1 History of Present Illness: Pt fell at work ~ 2 weeks ago going down a narrow flight of stairs.  24 hours after the fall he started developing pain in bilat shlds, upper back, and BUE.  After the onset of pain, he noticed the gradual loss of UE strength/function.    Subjective Data      Prior Functioning       Cognition  Cognition Arousal/Alertness: Awake/alert Behavior During Therapy: WFL for tasks assessed/performed Overall Cognitive Status: Within Functional Limits for tasks assessed    Mobility       Exercises      Balance     End of Session OT - End of Session Activity Tolerance: Patient tolerated treatment well Patient left: in chair;with call bell/phone within reach;with family/visitor present  GO     Loye Reininger, [name] 04/09/2013, 3:10 PM

## 2013-05-01 ENCOUNTER — Inpatient Hospital Stay (HOSPITAL_COMMUNITY)
Admission: AD | Admit: 2013-05-01 | Discharge: 2013-05-05 | DRG: 558 | Disposition: A | Discharge status: Home / Self Care | Payer: Worker's Compensation | Source: Other Acute Inpatient Hospital | Attending: Internal Medicine | Admitting: Internal Medicine

## 2013-05-01 ENCOUNTER — Encounter (HOSPITAL_COMMUNITY): Payer: Self-pay | Admitting: General Practice

## 2013-05-01 DIAGNOSIS — I5032 Chronic diastolic (congestive) heart failure: Secondary | ICD-10-CM | POA: Diagnosis present

## 2013-05-01 DIAGNOSIS — M7020 Olecranon bursitis, unspecified elbow: Principal | ICD-10-CM | POA: Diagnosis present

## 2013-05-01 DIAGNOSIS — Z8639 Personal history of other endocrine, nutritional and metabolic disease: Secondary | ICD-10-CM

## 2013-05-01 DIAGNOSIS — K219 Gastro-esophageal reflux disease without esophagitis: Secondary | ICD-10-CM | POA: Diagnosis present

## 2013-05-01 DIAGNOSIS — M71121 Other infective bursitis, right elbow: Secondary | ICD-10-CM | POA: Diagnosis present

## 2013-05-01 DIAGNOSIS — Z79899 Other long term (current) drug therapy: Secondary | ICD-10-CM

## 2013-05-01 DIAGNOSIS — M109 Gout, unspecified: Secondary | ICD-10-CM | POA: Diagnosis present

## 2013-05-01 DIAGNOSIS — G540 Brachial plexus disorders: Secondary | ICD-10-CM | POA: Diagnosis present

## 2013-05-01 DIAGNOSIS — E039 Hypothyroidism, unspecified: Secondary | ICD-10-CM | POA: Diagnosis present

## 2013-05-01 DIAGNOSIS — I517 Cardiomegaly: Secondary | ICD-10-CM

## 2013-05-01 DIAGNOSIS — Z862 Personal history of diseases of the blood and blood-forming organs and certain disorders involving the immune mechanism: Secondary | ICD-10-CM

## 2013-05-01 DIAGNOSIS — Z87891 Personal history of nicotine dependence: Secondary | ICD-10-CM

## 2013-05-01 DIAGNOSIS — E785 Hyperlipidemia, unspecified: Secondary | ICD-10-CM | POA: Diagnosis present

## 2013-05-01 DIAGNOSIS — I340 Nonrheumatic mitral (valve) insufficiency: Secondary | ICD-10-CM

## 2013-05-01 DIAGNOSIS — N179 Acute kidney failure, unspecified: Secondary | ICD-10-CM | POA: Diagnosis present

## 2013-05-01 DIAGNOSIS — I4891 Unspecified atrial fibrillation: Secondary | ICD-10-CM | POA: Diagnosis present

## 2013-05-01 DIAGNOSIS — IMO0002 Reserved for concepts with insufficient information to code with codable children: Secondary | ICD-10-CM | POA: Diagnosis present

## 2013-05-01 DIAGNOSIS — A419 Sepsis, unspecified organism: Secondary | ICD-10-CM | POA: Diagnosis present

## 2013-05-01 DIAGNOSIS — I480 Paroxysmal atrial fibrillation: Secondary | ICD-10-CM | POA: Diagnosis present

## 2013-05-01 DIAGNOSIS — I10 Essential (primary) hypertension: Secondary | ICD-10-CM | POA: Diagnosis present

## 2013-05-01 DIAGNOSIS — E876 Hypokalemia: Secondary | ICD-10-CM | POA: Diagnosis present

## 2013-05-01 LAB — CBC
Hemoglobin: 10.1 g/dL — ABNORMAL LOW (ref 13.0–17.0)
MCH: 31.7 pg (ref 26.0–34.0)
MCHC: 35.9 g/dL (ref 30.0–36.0)
Platelets: 133 10*3/uL — ABNORMAL LOW (ref 150–400)
RBC: 3.19 MIL/uL — ABNORMAL LOW (ref 4.22–5.81)
RDW: 13.4 % (ref 11.5–15.5)

## 2013-05-01 LAB — MRSA PCR SCREENING: MRSA by PCR: NEGATIVE

## 2013-05-01 MED ORDER — DILTIAZEM HCL 100 MG IV SOLR
5.0000 mg/h | INTRAVENOUS | Status: DC
  Administered 2013-05-01: 19:00:00 10 mg/h via INTRAVENOUS
  Administered 2013-05-01: 19:00:00 5 mg/h via INTRAVENOUS
  Filled 2013-05-01 (×2): qty 100

## 2013-05-01 MED ORDER — VANCOMYCIN HCL IN DEXTROSE 1-5 GM/200ML-% IV SOLN
1000.0000 mg | Freq: Three times a day (TID) | INTRAVENOUS | Status: DC
  Administered 2013-05-01 – 2013-05-02 (×2): 1000 mg via INTRAVENOUS
  Filled 2013-05-01 (×6): qty 200

## 2013-05-01 MED ORDER — ACETAMINOPHEN 325 MG PO TABS
650.0000 mg | ORAL_TABLET | Freq: Four times a day (QID) | ORAL | Status: DC | PRN
  Administered 2013-05-02 – 2013-05-04 (×2): 650 mg via ORAL
  Filled 2013-05-01 (×2): qty 2

## 2013-05-01 MED ORDER — DILTIAZEM LOAD VIA INFUSION
20.0000 mg | Freq: Once | INTRAVENOUS | Status: AC
  Administered 2013-05-01: 20 mg via INTRAVENOUS
  Filled 2013-05-01: qty 20

## 2013-05-01 MED ORDER — SODIUM CHLORIDE 0.9 % IJ SOLN
3.0000 mL | Freq: Two times a day (BID) | INTRAMUSCULAR | Status: DC
  Administered 2013-05-02 – 2013-05-03 (×4): 3 mL via INTRAVENOUS

## 2013-05-01 MED ORDER — HEPARIN SODIUM (PORCINE) 5000 UNIT/ML IJ SOLN
5000.0000 [IU] | Freq: Three times a day (TID) | INTRAMUSCULAR | Status: DC
  Filled 2013-05-01 (×2): qty 1

## 2013-05-01 MED ORDER — SODIUM CHLORIDE 0.9 % IJ SOLN: 3.0000 mL | INTRAMUSCULAR | Status: DC | PRN

## 2013-05-01 MED ORDER — FLUTICASONE PROPIONATE 50 MCG/ACT NA SUSP
2.0000 | NASAL | Status: DC | PRN
  Filled 2013-05-01: qty 16

## 2013-05-01 MED ORDER — MORPHINE SULFATE 2 MG/ML IJ SOLN: 2.0000 mg | INTRAMUSCULAR | Status: DC | PRN

## 2013-05-01 MED ORDER — FUROSEMIDE 10 MG/ML IJ SOLN
40.0000 mg | Freq: Once | INTRAMUSCULAR | Status: AC
  Administered 2013-05-01: 18:00:00 40 mg via INTRAVENOUS
  Filled 2013-05-01: qty 4

## 2013-05-01 MED ORDER — HEPARIN (PORCINE) IN NACL 100-0.45 UNIT/ML-% IJ SOLN
1700.0000 [IU]/h | INTRAMUSCULAR | Status: DC
  Administered 2013-05-01: 21:00:00 1350 [IU]/h via INTRAVENOUS
  Filled 2013-05-01 (×2): qty 250

## 2013-05-01 MED ORDER — HEPARIN BOLUS VIA INFUSION
4000.0000 [IU] | Freq: Once | INTRAVENOUS | Status: AC
  Administered 2013-05-01: 4000 [IU] via INTRAVENOUS
  Filled 2013-05-01: qty 4000

## 2013-05-01 MED ORDER — SODIUM CHLORIDE 0.9 % IV SOLN: 250.0000 mL | INTRAVENOUS | Status: DC | PRN

## 2013-05-01 MED ORDER — FEBUXOSTAT 40 MG PO TABS
40.0000 mg | ORAL_TABLET | Freq: Every day | ORAL | Status: DC
  Administered 2013-05-01 – 2013-05-05 (×5): 40 mg via ORAL
  Filled 2013-05-01 (×5): qty 1

## 2013-05-01 MED ORDER — INFLUENZA VAC SPLIT QUAD 0.5 ML IM SUSP
0.5000 mL | INTRAMUSCULAR | Status: AC
  Administered 2013-05-02: 09:00:00 0.5 mL via INTRAMUSCULAR
  Filled 2013-05-01: qty 0.5

## 2013-05-01 MED ORDER — HYDROCODONE-ACETAMINOPHEN 5-325 MG PO TABS: 1.0000 | ORAL_TABLET | ORAL | Status: DC | PRN

## 2013-05-01 MED ORDER — VITAMIN C 500 MG PO TABS
1000.0000 mg | ORAL_TABLET | Freq: Every day | ORAL | Status: DC
  Administered 2013-05-02 – 2013-05-05 (×4): 1000 mg via ORAL
  Filled 2013-05-01 (×4): qty 2

## 2013-05-01 MED ORDER — OFF THE BEAT BOOK
Freq: Once | Status: AC
  Administered 2013-05-01: 23:00:00
  Filled 2013-05-01: qty 1

## 2013-05-01 MED ORDER — TRIAMCINOLONE ACETONIDE 0.1 % EX CREA
1.0000 "application " | TOPICAL_CREAM | CUTANEOUS | Status: DC | PRN
  Filled 2013-05-01: qty 15

## 2013-05-01 MED ORDER — LEVOFLOXACIN 750 MG PO TABS
750.0000 mg | ORAL_TABLET | Freq: Every day | ORAL | Status: DC
  Administered 2013-05-01: 750 mg via ORAL
  Filled 2013-05-01 (×3): qty 1

## 2013-05-01 MED ORDER — ACETAMINOPHEN 650 MG RE SUPP: 650.0000 mg | Freq: Four times a day (QID) | RECTAL | Status: DC | PRN

## 2013-05-01 MED ORDER — PANTOPRAZOLE SODIUM 40 MG PO TBEC
40.0000 mg | DELAYED_RELEASE_TABLET | Freq: Every day | ORAL | Status: DC
  Administered 2013-05-02 – 2013-05-05 (×4): 40 mg via ORAL
  Filled 2013-05-01 (×4): qty 1

## 2013-05-01 MED ORDER — LEVOTHYROXINE SODIUM 50 MCG PO TABS
50.0000 ug | ORAL_TABLET | Freq: Every day | ORAL | Status: DC
  Administered 2013-05-01 – 2013-05-05 (×5): 50 ug via ORAL
  Filled 2013-05-01 (×7): qty 1

## 2013-05-01 NOTE — Progress Notes (Signed)
ANTICOAGULATION CONSULT NOTE - Initial Consult  Pharmacy Consult for heparin Indication: atrial fibrillation  Allergies  Allergen Reactions  . Penicillins     Unknown  Allergy since child    Patient Measurements: Height: 6' (182.9 cm) Weight: 233 lb 11 oz (106 kg) IBW/kg (Calculated) : 77.6 Heparin Dosing Weight: 91.8 kg (pt's dry wt PTA)  Vital Signs: Temp: 98.6 F (37 C) (09/30 1536) Temp src: Oral (09/30 1536) BP: 112/76 mmHg (09/30 1916) Pulse Rate: 175 (09/30 1900)  Labs: No results found for this basename: HGB, HCT, PLT, APTT, LABPROT, INR, HEPARINUNFRC, CREATININE, CKTOTAL, CKMB, TROPONINI,  in the last 72 hours  Estimated Creatinine Clearance: 81 ml/min (by C-G formula based on Cr of 1.19).   Medical History: Past Medical History  Diagnosis Date  . Essential hypertension, benign   . Hypothyroidism   . Other and unspecified hyperlipidemia   . Gouty arthropathy, unspecified   . GERD (gastroesophageal reflux disease)   . Paroxysmal atrial fibrillation     Medications:  Prescriptions prior to admission  Medication Sig Dispense Refill  . Ascorbic Acid (VITAMIN C) 1000 MG tablet Take 1,000 mg by mouth daily.      . cetirizine (ZYRTEC) 10 MG tablet Take 10 mg by mouth as needed for allergies.       . febuxostat (ULORIC) 40 MG tablet Take 40 mg by mouth daily.      . fluticasone (FLONASE) 50 MCG/ACT nasal spray Place 2 sprays into the nose as needed for rhinitis or allergies.       . hydrochlorothiazide (HYDRODIURIL) 25 MG tablet Take 25 mg by mouth daily.      Marland Kitchen HYDROcodone-acetaminophen (NORCO/VICODIN) 5-325 MG per tablet Take 1-2 tablets by mouth every 4 (four) hours as needed.  60 tablet  0  . levothyroxine (SYNTHROID, LEVOTHROID) 50 MCG tablet Take 50 mcg by mouth daily.      Marland Kitchen lisinopril (PRINIVIL,ZESTRIL) 40 MG tablet Take 40 mg by mouth daily.      . pantoprazole (PROTONIX) 40 MG tablet Take 40 mg by mouth daily.       Marland Kitchen triamcinolone cream (KENALOG) 0.1  % Apply 1 application topically as needed (dry legs).         Assessment: 62 yo M transferred from [facility] to start heparin per pharmacy for afib.  He was cardioverted 9/28 at [facility] for afib 2nd hypotension.   He has developed recurrent afib tonight and cardiology rec cardizem drip and heparin drip.   Wt PTA 91.8 kg.  H/H 14.6/40.6 and pltc 289.  Creat 1.19.  Goal of Therapy:  Heparin level 0.3-0.7 units/ml Monitor platelets by anticoagulation protocol: Yes   Plan:  Give 4000 units bolus x 1 Start heparin infusion at 1350 units/hr Check anti-Xa level in 6-8 hours and daily while on heparin Continue to monitor H&H and platelets Herby Abraham, Pharm.D. [phone] 05/01/2013 8:13 PM

## 2013-05-01 NOTE — Consult Note (Signed)
HPI: 62 year old male with a past medical history of paroxysmal atrial fibrillation, hypertension for evaluation of atrial fibrillation. Echocardiogram in March of 2013 showed normal LV function, grade 2 diastolic dysfunction. There was trace aortic insufficiency. There was mild dilatation of the ascending aorta. There was mild left atrial enlargement. Per old notes the patient has a history of paroxysmal atrial fibrillation but the patient does not realize this. 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 He was treated with steroids. He was admitted again in [place] with infection in his right elbow. Apparently upon arrival to the emergency room on September 28 he required cardioversion for atrial fibrillation because of hypertension. However he was also septic. He has been treated with IV fluids and his pressure has improved as has his infection. He developed recurrent atrial fibrillation this evening and cardiology is asked to evaluate. The patient denies dyspnea, chest pain, palpitations or syncope. He has diffuse edema from recent hydration. He also has been unable to urinate and a Foley placed.  Medications Prior to Admission  Medication Sig Dispense Refill  . Ascorbic Acid (VITAMIN C) 1000 MG tablet Take 1,000 mg by mouth daily.      . cetirizine (ZYRTEC) 10 MG tablet Take 10 mg by mouth as needed for allergies.       . febuxostat (ULORIC) 40 MG tablet Take 40 mg by mouth daily.      . fluticasone (FLONASE) 50 MCG/ACT nasal spray Place 2 sprays into the nose as needed for rhinitis or allergies.       . hydrochlorothiazide (HYDRODIURIL) 25 MG tablet Take 25 mg by mouth daily.      Marland Kitchen HYDROcodone-acetaminophen (NORCO/VICODIN) 5-325 MG per tablet Take 1-2 tablets by mouth every 4 (four) hours as needed.  60 tablet  0  . levothyroxine (SYNTHROID, LEVOTHROID) 50 MCG tablet Take 50 mcg by mouth daily.      Marland Kitchen lisinopril (PRINIVIL,ZESTRIL) 40 MG tablet Take 40  mg by mouth daily.      . pantoprazole (PROTONIX) 40 MG tablet Take 40 mg by mouth daily.       Marland Kitchen triamcinolone cream (KENALOG) 0.1 % Apply 1 application topically as needed (dry legs).         Allergies  Allergen Reactions  . Penicillins     Unknown  Allergy since child    Past Medical History  Diagnosis Date  . Essential hypertension, benign   . Hypothyroidism   . Other and unspecified hyperlipidemia   . Gouty arthropathy, unspecified   . GERD (gastroesophageal reflux disease)   . Paroxysmal atrial fibrillation     Past Surgical History  Procedure Laterality Date  . No past surgeries      History   Social History  . Marital Status: Married    Spouse Name: N/A    Number of Children: N/A  . Years of Education: N/A   Occupational History  .      [employer]   Social History Main Topics  . Smoking status: Former Smoker    Quit date: 08/02/1996  . Smokeless tobacco: Never Used  . Alcohol Use: Yes     Comment: Occasional  . Drug Use: No  . Sexual Activity: Not on file   Other Topics Concern  . Not on file   Social History Narrative   Has no children    Family History  Problem Relation Age of Onset  . Breast cancer Mother   . Other Father  unknown death cause    ROS:  no fevers or chills, productive cough, hemoptysis, dysphasia, odynophagia, melena, hematochezia, dysuria, hematuria, rash, seizure activity, orthopnea, PND, claudication. Remaining systems are negative.  Physical Exam:   Blood pressure 112/76, pulse 175, temperature 98.6 F (37 C), temperature source Oral, resp. rate 20, height 6' (1.829 m), weight 233 lb 11 oz (106 kg), SpO2 98.00%.  General:  Well developed/well nourished in NAD, diffuse anasarca Skin warm/dry Patient not depressed No peripheral clubbing Back-normal HEENT-normal/normal eyelids Neck supple/normal carotid upstroke bilaterally; no bruits; no JVD; no thyromegaly chest - CTA/ normal expansion CV -  irregular/tachycardic/normal S1 and S2; no murmurs, rubs or gallops;  PMI nondisplaced Abdomen -NT/ND, no HSM, no mass, + bowel sounds, no bruit 2+ femoral pulses, no bruits Ext-1+edema, no chords, 2+ DP Neuro-grossly nonfocal  ECG sinus rhythm with PACs Telemetry shows atrial fibrillation with a rapid ventricular response.  No results found for this or any previous visit (from the past 48 hour(s)).  No results found.  Assessment/Plan 1 paroxysmal atrial fibrillation-the patient has a history of atrial fibrillation. He had an episode in the emergency room that required cardioversion 2 days ago. He was hypotensive but this also occurred in the setting of sepsis. At present he is asymptomatic and has a good blood pressure. I agree with Cardizem for rate control. We will check an echocardiogram and TSH. I will place on IV heparin. Hopefully he will convert on his own. Otherwise we will consider elective cardioversion tomorrow. I think some of his atrial fibrillation is most likely being driven by the hyperadrenergic state associated with his infection. He has embolic risk factor of hypertension only. We will therefore treat with aspirin long-term. 2 question history of hypertrophic obstructive cardiomyopathy-most recent echocardiogram did not reveal this. Repeat echo. 3 hypertension-DC HCTZ and lisinopril. We will treat his blood pressure with Cardizem. 4 elbow infection-continue antibiotics.  Olga Millers MD 05/01/2013, 7:42 PM

## 2013-05-01 NOTE — Significant Event (Signed)
Rapid Response Event Note  Overview: Time Called: 1840 Arrival Time: 1845 Event Type: Cardiac  Initial Focused Assessment: Patient in RAF HR 170-200.  BP 122/77  RR 20  O2 sat 96-98 Alert and Oriented, ambulated from BR upon arrival. Lung sounds clear heart tone irregular Patient with significant discomfort, unable to void since Foley d/c'd this am  Interventions: 20 mg Cardizem Bolus Cardizem gtt started at 5mg  increased to 10mg  RN placed foley cath, 925 dilute urine Dr Jens Som at bedside to assess patient. Transferred patient to [phone] with heart monitor.  RN at bedside, questions answered.  Event Summary: Name of Physician Notified: Dr Brien Few at    Name of Consulting Physician Notified: Dr Jens Som at    Outcome: Transferred (Comment) [phone])  Event End Time: 1945  Marcellina Millin

## 2013-05-01 NOTE — Progress Notes (Signed)
05/01/2013 3:42 PM  Pt arrived via [facility] to [id number] around 1530.  Pt accompanied by his wife, with whom he lives.  Pt is fully alert and oriented, standby assist.  Pt denies pain at this time.  Pt is a moderate fall risk with a score of 6.  Yellow arm band and red socks applied.  Falls prevention safety plan reviewed with patient and his wife.  Pt wife signed form, and both verbalized understanding.  Pt does have a pinhole sized wound to his right elbow that is seeping a scant amount of serous fluid.  Pt states that it started as an abrasion from a fall that opened up slightly while he was in therapy last week as an outpatient.  Other elbow has very small abrasion noted that is scabbed over, dry and intact.  No other signs of skin breakdown noted.  Full assessment to EPIC, vitals stable.  Hospitalists paged regarding patient's arrival, awaiting further orders.  Pt and wife oriented to room/unit, and were instructed on how to utilize the call bell, to which they both verbalized understanding.  Will continue to monitor patient. Theadora Rama

## 2013-05-01 NOTE — Progress Notes (Signed)
Comment: Paged. Patient reportedly, went to the bathroom, and while straining to urinate, developed palpitations.  Vitals: HR 160-180/min. BP 160/108 mmhg. Telemetry showed fast atrial fibrillation.  No Chest pain, no SOB.  Chest is clinically clear.  Heart: sounds 1 & 2 heard, normal, irregular.  No new findings otherwise.   A/P: PAF. Now in A.Fib with RVR.  1. IV Cardizem bolus/infusion.. 2. Check Lytes/TSH stat. 3. Cycle cardiac enzymes.  4. Transfer to SDU. 5. Consult cardiology.   Note: Chads2 score is 0.   C.Aiman Sonn. MD, FACP.

## 2013-05-01 NOTE — Progress Notes (Addendum)
05/01/2013 6:47 PM  Around 1825, pt heart rate started to transition to SVT.  Pt was up walking around the room, using the restroom, and per his report, was straining really hard to urinate.  Pt otherwise asymptomatic.  BP 160/108, heart rates ranging from 167-211.  Denies dizziness, lightheadedness, chest pain or shortness of breath.  I attempted to get the patient back into bed to see if this was exertional in nature, but it did not lower heart rate.  Pt otherwise remains stable.  Dr. Brien Few notified of patient heart rate and status.  He arrived shortly thereafter.  Orders received for cardizem bolus and infusion, as well as to transfer to SDU.  Rapid response RN notified to assist with cardizem administration and transfer.  Will continue to monitor patient status, currently awaiting stepdown bed. Theadora Rama  This is a late entry.  Report called at 1910 to receiving RN.  All questions answered.  Pt belongings packed up and sent with wife to [address].  Transported patient with Rapid Response RN to [phone], where receiving nurse assumed care of the patient. Theadora Rama

## 2013-05-01 NOTE — H&P (Signed)
PCP:   Estanislado Pandy, MD   Chief Complaint:  Transfer from [facility] [place], for further management.   HPI: This is a 62 year old male, with known history of HTN, PAF, Query hypertrophic cardiomyopathy, hypothyroidism, Gout, GERD, s/p hospitalization 04/06/13-04/09/13 at [facility], for neck pain and bilateral UE weakness which was the result of a work related injury when he slipped down some stairs and hit his face/head causing a hyperextension injury to his neck, with resultant brachial plexopathy. Patient continued outpatient OT after discharge, and on 04/27/13, his therapist noticed that he had a painful swollen right elbow, that was oozing fluid. She recommended that he see a doctor. On 04/29/13, he felt very unwell, his spouse checked his blood pressure, and found it to be quite low. She called the doctor, and patient was advised to go to the ED.  According to transfer notes, he presented to [facility] on 04/29/13, with generalized fever, chills, rigors and an infected , swollen right elbow, with draining wound, was found to be in septic shock, rapid atrial fibrillation, requiring DCCV in the ED, followed by hospitalization in the ICU, aggressive iv fluid resuscitation, pressor support and broad spectrum antibiotic therapy with IV Vancomycin and Levaquin. Clinical course was complicated by ARF and electrolyte derangements, including profound hypomagnesemia. Blood cultures and wound cultures were still negative at the time of transfer. Transfer labs show CBC 5.6, HB 9.5, HCT 28.0, PLs 111. Sodium 136, potassium 3.6, chloride 106, CO2 26, BUN 18, creatinine 0.99, glucose 126.    Allergies:   Allergies  Allergen Reactions  . Penicillins     Unknown  Allergy since child      Past Medical History  Diagnosis Date  . Essential hypertension, benign   . Hypothyroidism   . Other and unspecified hyperlipidemia   . Gouty arthropathy, unspecified   . Undiagnosed cardiac  murmurs   . GERD (gastroesophageal reflux disease)   . Left ventricular hypertrophy      questionable hypertrophic cardiomyopathy echocardiogram 2011 mean gradient across the outflow tracts 27 mmHg and peak gradient 46 mm of mercury, asymptomatic apparently systolic anterior motion of the mitral valve causing dynamic out flow obstruction. 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 Paroxysmal atrial fibrillation     Past Surgical History  Procedure Laterality Date  . No past surgeries      Prior to Admission medications   Medication Sig Start Date End Date Taking? Authorizing Provider  Ascorbic Acid (VITAMIN C) 1000 MG tablet Take 1,000 mg by mouth daily.   Yes Historical Provider, MD  cetirizine (ZYRTEC) 10 MG tablet Take 10 mg by mouth as needed for allergies.    Yes Historical Provider, MD  febuxostat (ULORIC) 40 MG tablet Take 40 mg by mouth daily.   Yes Historical Provider, MD  fluticasone (FLONASE) 50 MCG/ACT nasal spray Place 2 sprays into the nose as needed for rhinitis or allergies.    Yes Historical Provider, MD  hydrochlorothiazide (HYDRODIURIL) 25 MG tablet Take 25 mg by mouth daily.   Yes Historical Provider, MD  HYDROcodone-acetaminophen (NORCO/VICODIN) 5-325 MG per tablet Take 1-2 tablets by mouth every 4 (four) hours as needed. 04/09/13  Yes Zonia Kief, PA-C  levothyroxine (SYNTHROID, LEVOTHROID) 50 MCG tablet Take 50 mcg by mouth daily.   Yes Historical Provider, MD  lisinopril (PRINIVIL,ZESTRIL) 40 MG tablet Take 40 mg by mouth daily.   Yes Historical Provider, MD  pantoprazole (PROTONIX) 40  MG tablet Take 40 mg by mouth daily.  08/22/11  Yes Len Blalock, NP  triamcinolone cream (KENALOG) 0.1 % Apply 1 application topically as needed (dry legs).    Yes Historical Provider, MD    Social History: Patient reports that he has never smoked. He does not have any smokeless tobacco history on file. He reports that he does not drink  alcohol or use illicit drugs.  Family History  Problem Relation Age of Onset  . Breast cancer Mother   . Other Father     unknown death cause    Review of Systems:  As per HPI and chief complaint. At this time, patient feels quite well, with although  A bit weak, has good appetite, no abdominal pain, vomiting or diarrhea. No chest pain or SOB. Foley catheter was removed on 05/01/13 just prior to transfer, and he has been able to urinate quite well. The rest of the systems review is negative.    Physical Exam:  General:  Patient does not appear to be in obvious acute distress. Alert, communicative, fully oriented, talking in complete sentences, not short of breath at rest. HEENT:  Mild clinical pallor, no jaundice, no conjunctival injection or discharge. NECK:  Supple, JVP not seen, no carotid bruits, no palpable lymphadenopathy, no palpable goiter. CHEST:  Clinically clear to auscultation, no wheezes, no crackles. HEART:  Sounds 1 and 2 heard, normal, regular, no murmurs. ABDOMEN:  Full, soft, non-tender, no palpable organomegaly, no palpable masses, normal bowel sounds. GENITALIA:  Not examined. UPPER EXTREMITIES: Right elbow wound is pinhole size, without drainage, and practically no local inflammatory phenomena at this time.  LOWER EXTREMITIES:  Mild pitting edema, palpable peripheral pulses. MUSCULOSKELETAL SYSTEM:  Generalized osteoarthritic changes. Patient has dressing over right elbow wound, There is only minimal right elbow swelling, full ROM. He appears to have some "stiffness" in both shoulders.  CENTRAL NERVOUS SYSTEM:  No focal neurologic deficit on gross examination.  Labs on Admission:  No results found for this or any previous visit (from the past 48 hour(s)).  Radiological Exams on Admission: No results found.  Assessment/Plan Active Problems:    1. Severe sepsis with septic shock: Patient was admitted to [facility] with septic shock,  requiring pressor support, and broad spectrum antibiotic treatment. No culprit organisms identified. However, he appears to have responded to therapy with iv Vancomycin/Levaquin now day#3, and sepsis has resolved. Off pressors at the time of transfer. We shall continue current antibiotics, and consult ID for recommendations, re duration of treatment.   2. PAF (paroxysmal atrial fibrillation): Patient has a known history of PAF, and was in fast atrial fibrillation at the time of presentation to ED on 04/29/13. As he was hemodynamically unstable at that time, he was administered DCCV, with reversion to SR. He has since remained in SR. Precipitant was no doubt his sepsis, and no ACS was documented. Will continue telemetric monitoring. 12.Lead EKG shows SR.  3. History of cardiomyopathy: Patient has a history of query hypertrophic cardiomyopathy. Per transfer records, 2D Echocardiogram showed EF 55%-60%, mild -moderate MR and borderline concentric LVH. Clinically, he has no features of CHF decompensation at this time.   4. ARF (acute renal failure): Creatinine was 2.84 on 04/29/13, in the setting of hypotension and sepsis. Managed with aggressive iv fluids and avoidance of nephrotoxins, as well as brief pressor support. Creatinine is 0.99 today. ARF has resolved.  5. Other infective bursitis, right elbow: Patient has a history of gout,  and an infected right elbow bursa, may have been the source of his sepsis, although bacteriology is negative. Clinically, local inflammatory phenomena have practically resolved. Reportedly, X-Rays revealed no acute findings. Will consult WOC to manage wound.  6. HTN (hypertension): BP is reasonably controlled at this time.  7. Gout: Asymptomatic.  8. Brachial Plexopathy: Improving. Will continue PT/OT. Dr Debria Garret Brook's team will be informed of hospitalization on 05/02/13.  9. GERD: Asymptomatic. 10: Hypothyroidism: Continue thyroxine replacement therapy.    Further management  will depend on clinical course.   Comment: Patient is Full CODE.   Time Spent on Admission: 1 Hour.   Favour Aleshire,CHRISTOPHER 05/01/2013, 4:59 PM

## 2013-05-01 NOTE — Progress Notes (Signed)
ANTIBIOTIC CONSULT NOTE - INITIAL  Pharmacy Consult for vancomycin and levaquin Indication: sepsis  Allergies  Allergen Reactions  . Penicillins     Unknown  Allergy since child    Patient Measurements: Height: 6' (182.9 cm) Weight: 233 lb 11 oz (106 kg) IBW/kg (Calculated) : 77.6 Weight prior to admission per pt report : 91.8 kg  Vital Signs: Temp: 98.6 F (37 C) (09/30 1536) Temp src: Oral (09/30 1536) BP: 148/98 mmHg (09/30 1536) Pulse Rate: 113 (09/30 1536) Intake/Output from previous day:   Intake/Output from this shift: Total I/O In: 0  Out: 3 [Urine:3]  Labs: No results found for this basename: WBC, HGB, PLT, LABCREA, CREATININE,  in the last 72 hours Estimated Creatinine Clearance: 81 ml/min (by C-G formula based on Cr of 1.19). No results found for this basename: VANCOTROUGH, VANCOPEAK, VANCORANDOM, GENTTROUGH, GENTPEAK, GENTRANDOM, TOBRATROUGH, TOBRAPEAK, TOBRARND, AMIKACINPEAK, AMIKACINTROU, AMIKACIN,  in the last 72 hours   Microbiology: No results found for this or any previous visit (from the past 720 hour(s)).  Medical History: Past Medical History  Diagnosis Date  . Essential hypertension, benign   . Hypothyroidism   . Other and unspecified hyperlipidemia   . Gouty arthropathy, unspecified   . Undiagnosed cardiac murmurs   . GERD (gastroesophageal reflux disease)   . Left ventricular hypertrophy      questionable hypertrophic cardiomyopathy echocardiogram 2011 mean gradient across the outflow tracts 27 mmHg and peak gradient 46 mm of mercury, asymptomatic apparently systolic anterior motion of the mitral valve causing dynamic out flow obstruction. 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 Paroxysmal atrial fibrillation     Medications:  Prescriptions prior to admission  Medication Sig Dispense Refill  . Ascorbic Acid (VITAMIN C) 1000 MG tablet Take 1,000 mg by mouth daily.      . cetirizine (ZYRTEC)  10 MG tablet Take 10 mg by mouth as needed for allergies.       . febuxostat (ULORIC) 40 MG tablet Take 40 mg by mouth daily.      . fluticasone (FLONASE) 50 MCG/ACT nasal spray Place 2 sprays into the nose as needed for rhinitis or allergies.       . hydrochlorothiazide (HYDRODIURIL) 25 MG tablet Take 25 mg by mouth daily.      Marland Kitchen HYDROcodone-acetaminophen (NORCO/VICODIN) 5-325 MG per tablet Take 1-2 tablets by mouth every 4 (four) hours as needed.  60 tablet  0  . levothyroxine (SYNTHROID, LEVOTHROID) 50 MCG tablet Take 50 mcg by mouth daily.      Marland Kitchen lisinopril (PRINIVIL,ZESTRIL) 40 MG tablet Take 40 mg by mouth daily.      . pantoprazole (PROTONIX) 40 MG tablet Take 40 mg by mouth daily.       Marland Kitchen triamcinolone cream (KENALOG) 0.1 % Apply 1 application topically as needed (dry legs).         Assessment: Mr. Paragas is a pleasant 62 yo WM transferred from [facility] to continue vancomcycin and levaquin for treatment of severe sepsis with septic shock.  I have reviewed the records from [facility], spoken to the patient and his wife and spoken with a pharmacist Pamelia Hoit) at [facility] by phone.  Per admitting MD " he was admitted to [facility] with septic shock, requiring pressor support, and broad spectrum antibiotic treatment. No culprit organisms identified. However, he appears to have responded to therapy with iv Vancomycin/Levaquin now day#3, and sepsis has resolved. Off pressors at the  time of transfer. We shall continue current antibiotics, and consult ID for recommendations, re duration of treatment. "  On admission to [facility] he was in ARF which has completely resolved.  His wt taken just now is 106 kg but he is still volume overloaded from fluid resuscitation. He states his usual wt is 91.8 kg.    Creat trend from [facility] records:  2.84>2.54>1.9>1.28>0.99 today WBC trend from [facility] records:  16.8>14.5>5.6  9/28 urine cx ngtd 9/28 elbow wound culture: normal skin flora 9/28  BC ngtd   vanc 1.5 gm and [facility] 9/28 and 9/29 at 1700 levaquin 750 mg IV x 1 dose at [facility] 9/29 at 0100 am  vanc 9/28>> levaquin 9/29>>   Goal of Therapy:  Vancomycin trough level 15-20 mcg/ml  Plan:  1. levaquin 750 mg po qhs 2. Vancomycin 1000 mg IV q8h 3. Check steady-state vancomycin trough. Herby Abraham, Pharm.D. [phone] 05/01/2013 6:14 PM

## 2013-05-02 DIAGNOSIS — I10 Essential (primary) hypertension: Secondary | ICD-10-CM

## 2013-05-02 DIAGNOSIS — A419 Sepsis, unspecified organism: Secondary | ICD-10-CM

## 2013-05-02 DIAGNOSIS — M7020 Olecranon bursitis, unspecified elbow: Principal | ICD-10-CM

## 2013-05-02 DIAGNOSIS — I4891 Unspecified atrial fibrillation: Secondary | ICD-10-CM

## 2013-05-02 DIAGNOSIS — IMO0002 Reserved for concepts with insufficient information to code with codable children: Secondary | ICD-10-CM

## 2013-05-02 LAB — CBC
HCT: 27.2 % — ABNORMAL LOW (ref 39.0–52.0)
Hemoglobin: 9.8 g/dL — ABNORMAL LOW (ref 13.0–17.0)
MCH: 31.8 pg (ref 26.0–34.0)
MCHC: 36 g/dL (ref 30.0–36.0)
Platelets: 128 10*3/uL — ABNORMAL LOW (ref 150–400)
RDW: 13.5 % (ref 11.5–15.5)
WBC: 4.4 10*3/uL (ref 4.0–10.5)

## 2013-05-02 LAB — CREATININE, SERUM: Creatinine, Ser: 0.77 mg/dL (ref 0.50–1.35)

## 2013-05-02 LAB — HEPARIN LEVEL (UNFRACTIONATED)
Heparin Unfractionated: 0.21 IU/mL — ABNORMAL LOW (ref 0.30–0.70)
Heparin Unfractionated: 0.18 IU/mL — ABNORMAL LOW (ref 0.30–0.70)

## 2013-05-02 LAB — COMPREHENSIVE METABOLIC PANEL
ALT: 41 U/L (ref 0–53)
AST: 18 U/L (ref 0–37)
Albumin: 2.9 g/dL — ABNORMAL LOW (ref 3.5–5.2)
Alkaline Phosphatase: 42 U/L (ref 39–117)
Glucose, Bld: 117 mg/dL — ABNORMAL HIGH (ref 70–99)
Potassium: 3 mEq/L — ABNORMAL LOW (ref 3.5–5.1)
Sodium: 137 mEq/L (ref 135–145)
Total Protein: 5.5 g/dL — ABNORMAL LOW (ref 6.0–8.3)

## 2013-05-02 LAB — TSH: TSH: 2.098 u[IU]/mL (ref 0.350–4.500)

## 2013-05-02 LAB — MAGNESIUM
Magnesium: 1.3 mg/dL — ABNORMAL LOW (ref 1.5–2.5)
Magnesium: 1.6 mg/dL (ref 1.5–2.5)

## 2013-05-02 LAB — TROPONIN I
Troponin I: <0.3 ng/mL (ref <0.30)
Troponin I: <0.3 ng/mL (ref <0.30)

## 2013-05-02 MED ORDER — HEPARIN SODIUM (PORCINE) 5000 UNIT/ML IJ SOLN
5000.0000 [IU] | Freq: Three times a day (TID) | INTRAMUSCULAR | Status: DC
  Administered 2013-05-02 – 2013-05-03 (×4): 5000 [IU] via SUBCUTANEOUS
  Filled 2013-05-02 (×7): qty 1

## 2013-05-02 MED ORDER — METOPROLOL TARTRATE 1 MG/ML IV SOLN
2.5000 mg | Freq: Once | INTRAVENOUS | Status: DC
  Filled 2013-05-02: qty 5

## 2013-05-02 MED ORDER — DILTIAZEM HCL 30 MG PO TABS
30.0000 mg | ORAL_TABLET | Freq: Four times a day (QID) | ORAL | Status: DC
Start: 2013-05-02 — End: 2013-05-02
  Administered 2013-05-02 (×2): 30 mg via ORAL
  Filled 2013-05-02 (×7): qty 1

## 2013-05-02 MED ORDER — CEFAZOLIN SODIUM-DEXTROSE 2-3 GM-% IV SOLR
2.0000 g | Freq: Three times a day (TID) | INTRAVENOUS | Status: DC
  Administered 2013-05-02 – 2013-05-05 (×9): 2 g via INTRAVENOUS
  Filled 2013-05-02 (×12): qty 50

## 2013-05-02 MED ORDER — DILTIAZEM HCL 25 MG/5ML IV SOLN
5.0000 mg | Freq: Once | INTRAVENOUS | Status: AC
  Administered 2013-05-02: 5 mg via INTRAVENOUS
  Filled 2013-05-02: qty 5

## 2013-05-02 MED ORDER — POTASSIUM CHLORIDE CRYS ER 20 MEQ PO TBCR
30.0000 meq | EXTENDED_RELEASE_TABLET | ORAL | Status: AC
  Administered 2013-05-02 (×2): 30 meq via ORAL
  Filled 2013-05-02 (×3): qty 1

## 2013-05-02 MED ORDER — MAGNESIUM SULFATE IN D5W 10-5 MG/ML-% IV SOLN
1.0000 g | Freq: Once | INTRAVENOUS | Status: AC
  Administered 2013-05-02: 1 g via INTRAVENOUS
  Filled 2013-05-02 (×2): qty 100

## 2013-05-02 MED ORDER — DILTIAZEM HCL ER COATED BEADS 180 MG PO CP24
180.0000 mg | ORAL_CAPSULE | Freq: Every day | ORAL | Status: DC
  Administered 2013-05-02: 180 mg via ORAL
  Filled 2013-05-02 (×2): qty 1

## 2013-05-02 NOTE — Progress Notes (Signed)
Ancef per Rx  ID: 62 yo WM from [facility] for severe sepsis/shock. No culprit organisms ID'd. Sepsis has resolved. Off pressors at the time of transfer. His wt taken just now is 106 kg but he is still volume overloaded from fluid resuscitation. He states his usual wt is 91.8 kg. WBC trend from [facility] records: 16.8>14.5>5.6>4.4 vanc 1.5 gm and [facility] 9/28 and 9/29 at 1700 levaquin 750 mg IV x 1 dose at [facility] 9/29 at 0100 am ID now wants to switch ABX to ancef only for bursitis and may go home with just cephalexin. Per MD, ok to start ancef even though hx of possible remote allergy to PCN.  SCr 0.78 (CrCl > 100)  9/28 urine cx ngtd 9/28 elbow wound culture: normal skin flora 9/28 BC ngtd  vanc 9/28 (1g q8h started on 09/30)>> 10/01 levaquin 9/29>> 10/01 Cefazolin 10/01 >>  Plan:  1. Ancef 2g iv q8h

## 2013-05-02 NOTE — Progress Notes (Signed)
Utilization Review Completed Jeraldine Primeau J. Rudolpho Claxton, RN, BSN, NCM [phone]  

## 2013-05-02 NOTE — Evaluation (Signed)
Physical Therapy One Time Evaluation Patient Details Name: Andrew Compton MRN: [id number] DOB: 1951/01/25 Today's Date: 05/02/2013 Time: 4098-1191 PT Time Calculation (min): 11 min  PT Assessment / Plan / Recommendation History of Present Illness  is a 62 year old male, with known history of HTN, PAF, Query hypertrophic cardiomyopathy, hypothyroidism, Gout, GERD, s/p hospitalization 04/06/13-04/09/13 at [facility], for neck pain and bilateral UE weakness which was the result of a work related injury when he slipped down some stairs and hit his face/head causing a hyperextension injury to his neck, with resultant brachial plexopathy. Patient continued outpatient OT after discharge, and on 04/27/13, his therapist noticed that he had a painful swollen right elbow, that was oozing fluid. She recommended that he see a doctor. On 04/29/13, he felt very unwell, his spouse checked his blood pressure, and found it to be quite low. She called the doctor, and patient was advised to go to the ED.  According to transfer notes, he presented to [facility] on 04/29/13, with generalized fever, chills, rigors and an infected , swollen right elbow, with draining wound, was found to be in septic shock, rapid atrial fibrillation, requiring DCCV in the ED, followed by hospitalization in the ICU, aggressive iv fluid resuscitation, pressor support and broad spectrum antibiotic therapy with IV Vancomycin and Levaquin  Clinical Impression  Pt admitted with sepsis. Pt evaluated by Physical Therapy with no further acute PT needs identified. All education has been completed and the pt has no further questions. See below for any follow-up Physical Therapy or equipment needs. PT is signing off. Thank you for this referral.    PT Assessment  Patent does not need any further PT services    Follow Up Recommendations  Other (comment) (Pt currently receiving outpatient OT for bil UE weakness)    Does the patient have  the potential to tolerate intense rehabilitation      Barriers to Discharge        Equipment Recommendations  None recommended by PT    Recommendations for Other Services     Frequency      Precautions / Restrictions Precautions Precautions: Fall Restrictions Weight Bearing Restrictions: No   Pertinent Vitals/Pain Pt reports 2/10 R elbow pain at rest but wanted to ambulate, as pain does not increase during mobility. Pt's HR at rest: 86 bpm, 105 bpm during amb., and 83 bpm after ambulation while seated. No c/o of dizziness or SOB during session.      Mobility  Bed Mobility Bed Mobility: Not assessed Details for Bed Mobility Assistance: pt sitting in chair upon arrival. Transfers Transfers: Sit to Stand;Stand to Sit Sit to Stand: 6: Modified independent (Device/Increase time);With upper extremity assist (RUE assist) Stand to Sit: 6: Modified independent (Device/Increase time);With upper extremity assist (with R UE assist) Details for Transfer Assistance: MOD I due to increased time as pt has weakness in bil UEs due to brachial plexus injuries (L>R). HR at rest 86bpm. Ambulation/Gait Ambulation/Gait Assistance: 6: Modified independent (Device/Increase time) Ambulation Distance (Feet): 300 Feet Assistive device: None Ambulation/Gait Assistance Details: pt able to ambulate 300' without an AD at MOD I due to decreased speed. HR during amb. 105bpm and 83bpm after amb. while sitting. Gait Pattern: Within Functional Limits Gait velocity: Decreased    Exercises     PT Diagnosis:    PT Problem List:   PT Treatment Interventions:       PT Goals(Current goals can be found in the care plan section) Acute Rehab  PT Goals PT Goal Formulation: No goals set, d/c therapy  Visit Information  Last PT Received On: 05/02/13 Assistance Needed: +1 History of Present Illness: is a 62 year old male, with known history of HTN, PAF, Query hypertrophic cardiomyopathy, hypothyroidism, Gout, GERD,  s/p hospitalization 04/06/13-04/09/13 at [facility], for neck pain and bilateral UE weakness which was the result of a work related injury when he slipped down some stairs and hit his face/head causing a hyperextension injury to his neck, with resultant brachial plexopathy. Patient continued outpatient OT after discharge, and on 04/27/13, his therapist noticed that he had a painful swollen right elbow, that was oozing fluid. She recommended that he see a doctor. On 04/29/13, he felt very unwell, his spouse checked his blood pressure, and found it to be quite low. She called the doctor, and patient was advised to go to the ED.  According to transfer notes, he presented to [facility] on 04/29/13, with generalized fever, chills, rigors and an infected , swollen right elbow, with draining wound, was found to be in septic shock, rapid atrial fibrillation, requiring DCCV in the ED, followed by hospitalization in the ICU, aggressive iv fluid resuscitation, pressor support and broad spectrum antibiotic therapy with IV Vancomycin and Levaquin       Prior Functioning  Home Living Family/patient expects to be discharged to:: Private residence Living Arrangements: Spouse/significant other Available Help at Discharge: Family;Available PRN/intermittently Type of Home: House Home Access: Level entry Home Layout: One level Home Equipment: None Prior Function Level of Independence: Independent Communication Communication: No difficulties Dominant Hand: Right    Cognition  Cognition Arousal/Alertness: Awake/alert Behavior During Therapy: WFL for tasks assessed/performed Overall Cognitive Status: Within Functional Limits for tasks assessed    Extremity/Trunk Assessment Upper Extremity Assessment Upper Extremity Assessment: Defer to OT evaluation (hx of bil brachial plexus injury, LUE weakness>RUE weakness.) Lower Extremity Assessment Lower Extremity Assessment: Overall WFL for tasks assessed    Balance    End of Session PT - End of Session Activity Tolerance: Patient tolerated treatment well Patient left: in chair;with family/visitor present;with call bell/phone within reach  GP     Sol Blazing 05/02/2013, 1:38 PM

## 2013-05-02 NOTE — Progress Notes (Signed)
ANTICOAGULATION CONSULT NOTE - Follow Up Consult  Pharmacy Consult for heparin Indication: atrial fibrillation  Labs:  Recent Labs  05/01/13 2317 05/02/13 0120 05/02/13 0527  HGB 10.1*  --  9.8*  HCT 28.1*  --  27.2*  PLT 133*  --  128*  HEPARINUNFRC  --  0.21* 0.18*  CREATININE 0.77  --  0.78  TROPONINI 0.35*  --  <0.30    Assessment: 62yo male now with even lower heparin level after rate increase; latest level was drawn early but suspect that first level was showing bolus effect and pt requires higher rate.  Goal of Therapy:  Heparin level 0.3-0.7 units/ml   Plan:  Will increase heparin gtt by 1-2 units/kg/hr to 1700 units/hr and check level in 6hr.  Vernard Gambles, PharmD, BCPS  05/02/2013,6:34 AM

## 2013-05-02 NOTE — Consult Note (Signed)
WOC consult Note Reason for Consult: Consult requested for right elbow.  Pt states he fell at home and it had an open "pinhole" wound which was draining mod amt greenish-yellow fluid upon admission.  He has been on IV antibiotics and there is no further drainage or open wound.  Outline of previous wound over elbow visible. .1X.1cm pink dry scar tissue.  No odor or drainage.  Pt C/O pain.  He has hx of gout previously.  Recommend follow-up with outpatient physician if drainage , open wound, or increased pain re-occur. Foam dressing to protect from further injury while in hospital.  Pt and wife appear to have understanding of discussion and deny further questions at this time. Please re-consult if further assistance is needed.  Thank-you,  Cammie Mcgee MSN, RN, CWOCN, [place], CNS [phone]

## 2013-05-02 NOTE — Progress Notes (Signed)
   Subjective:  Denies CP or dyspnea; complains of right elbow pain   Objective:  Filed Vitals:   05/02/13 0331 05/02/13 0400 05/02/13 0500 05/02/13 0600  BP: 136/90 112/64 113/70 132/83  Pulse: 83     Temp: 98.5 F (36.9 C)     TempSrc: Oral     Resp: 18 20 18 18   Height:      Weight:      SpO2: 96% 98% 97% 97%    Intake/Output from previous day:  Intake/Output Summary (Last 24 hours) at 05/02/13 0730 Last data filed at 05/02/13 0600  Gross per 24 hour  Intake 266.23 ml  Output   4053 ml  Net -3786.77 ml    Physical Exam: Physical exam: Well-developed well-nourished in no acute distress.  Skin is warm and dry.  HEENT is normal.  Neck is supple.  Chest is clear to auscultation with normal expansion.  Cardiovascular exam is regular rate and rhythm.  Abdominal exam nontender or distended. No masses palpated. Extremities show trace edema. Right elbow dressing in place neuro grossly intact    Lab Results: Basic Metabolic Panel:  Recent Labs  16/10/96 2317 05/02/13 0527  NA  --  137  K 2.8* 3.0*  CL  --  100  CO2  --  28  GLUCOSE  --  117*  BUN  --  12  CREATININE 0.77 0.78  CALCIUM  --  8.7  MG 1.3* 1.6   CBC:  Recent Labs  05/01/13 2317 05/02/13 0527  WBC 5.5 4.4  HGB 10.1* 9.8*  HCT 28.1* 27.2*  MCV 88.1 88.3  PLT 133* 128*   Cardiac Enzymes:  Recent Labs  05/01/13 2317 05/02/13 0527  TROPONINI 0.35* <0.30     Assessment/Plan:  1 paroxysmal atrial fibrillation-Back in sinus; change cardizem to 180 mg CD po daily. Await echocardiogram and TSH. DC IV heparin and begin ASA 325 mg daily. I think some of his atrial fibrillation is most likely being driven by the hyperadrenergic state associated with his infection. He has embolic risk factor of hypertension only. We will therefore treat with aspirin long-term.  2 question history of hypertrophic obstructive cardiomyopathy-most recent echocardiogram did not reveal this. Repeat echo.  3  hypertension-Follow BP on cardizem and adjust as needed. 4 elbow infection-continue antibiotics. Management per primary care.   Olga Millers 05/02/2013, 7:30 AM

## 2013-05-02 NOTE — Consult Note (Signed)
[facility]     Reason for Consult: sepsis syndrome, ? bursitis    Referring Physician: Dr. Johny Shock  Active Problems:   Severe sepsis with septic shock   PAF (paroxysmal atrial fibrillation)   ARF (acute renal failure)   Other infective bursitis, right elbow   HTN (hypertension)   Gout   . diltiazem  180 mg Oral Daily  . febuxostat  40 mg Oral Daily  . heparin subcutaneous  5,000 Units Subcutaneous Q8H  . levothyroxine  50 mcg Oral QAC breakfast  . pantoprazole  40 mg Oral Daily  . sodium chloride  3 mL Intravenous Q12H  . vitamin C  1,000 mg Oral Daily    Recommendations: D/c vanco/levaquin I will start ancef, likely home with keflex   Assessment: He developed a sepsis syndrome after developing an abscess/bursitis of his elbow which drained on its own.  It does not appear to have been septic arthritis, though no records available at this time.  No culture has been positive but phones not working at OSH.   He has a penicillin allergy that occurred while he was a baby, no known reaction.  Doubt true allergy and will use ancef while he is on monitor.  Does remember developing hives to erythromycin or azithromycin.       Antibiotics: Vancomycin/levaquin  HPI: Andrew Compton is a 62 y.o. male with recent hospitalization for a neck injury discharged 9/8 and in OT and noted a painful and swollen right elbow.  It was draining pus, though apparently cultures were negative.  Xray reported to be negative.  He was noted to be hypotensive and required pressor support and resuscitation.  Had fever and chills.  Developed rapid afib.  Transferred for further care.  No afebrile, not requiring pressor support.  Has been on vancomycin and levaquin.  Feels much better though still with pain in elbow.     Review of Systems: A comprehensive review of systems was negative.  Past Medical History  Diagnosis Date  . Essential hypertension, benign   .  Hypothyroidism   . Other and unspecified hyperlipidemia   . Gouty arthropathy, unspecified   . GERD (gastroesophageal reflux disease)   . Paroxysmal atrial fibrillation     History  Substance Use Topics  . Smoking status: Former Smoker    Quit date: 08/02/1996  . Smokeless tobacco: Never Used  . Alcohol Use: Yes     Comment: Occasional    Family History  Problem Relation Age of Onset  . Breast cancer Mother   . Other Father     unknown death cause   Allergies  Allergen Reactions  . Azithromycin Hives  . Penicillins     Unknown  Allergy since child    OBJECTIVE: Blood pressure 126/94, pulse 86, temperature 98.4 F (36.9 C), temperature source Oral, resp. rate 18, height 6' (1.829 m), weight 229 lb 4.5 oz (104 kg), SpO2 98.00%. General: Awake, alert, in chair, nad Skin: no rashes Lungs: cta b Cor: irr irr Abdomen: soft, nt, nd Elbow with minimal tenderness, no drainage  Microbiology: Recent Results (from the past 240 hour(s))  MRSA PCR SCREENING     Status: None   Collection Time    05/01/13  7:46 PM      Result Value Range Status   MRSA by PCR NEGATIVE  NEGATIVE Final   Comment:            The GeneXpert MRSA Assay (FDA  approved for NASAL specimens     only), is one component of a     comprehensive MRSA colonization     surveillance program. It is not     intended to diagnose MRSA     infection nor to guide or     monitor treatment for     MRSA infections.    Staci Righter, MD [facility] [email or web address] [phone] pager  [phone] cell 05/02/2013, 12:05 PM

## 2013-05-02 NOTE — Evaluation (Signed)
Occupational Therapy Evaluation Patient Details Name: KARVER FADDEN MRN: [id number] DOB: [birth year] Today's Date: 05/02/2013 Time: 4098-1191 OT Time Calculation (min): 38 min  OT Assessment / Plan / Recommendation History of present illness is a 62 year old male, with known history of HTN, PAF, Query hypertrophic cardiomyopathy, hypothyroidism, Gout, GERD, s/p hospitalization 04/06/13-04/09/13 at [facility], for neck pain and bilateral UE weakness which was the result of a work related injury when he slipped down some stairs and hit his face/head causing a hyperextension injury to his neck, with resultant brachial plexopathy. Patient continued outpatient OT after discharge, and on 04/27/13, his therapist noticed that he had a painful swollen right elbow, that was oozing fluid. She recommended that he see a doctor. On 04/29/13, he felt very unwell, his spouse checked his blood pressure, and found it to be quite low. She called the doctor, and patient was advised to go to the ED.  According to transfer notes, he presented to [facility] [place] on 04/29/13, with generalized fever, chills, rigors and an infected , swollen right elbow, with draining wound, was found to be in septic shock, rapid atrial fibrillation, requiring DCCV in the ED, followed by hospitalization in the ICU, aggressive iv fluid resuscitation, pressor support and broad spectrum antibiotic therapy with IV Vancomycin and Levaquin   Clinical Impression   Pt presents with weakness, mild edema, and numbness B UEs from previous injury.  Pt is functioning at his pre hospitalization baseline.  He has wrist cock up splints and appreciates the importance, but is not able to wear them here because of IV sites so therefore is positioning them to prevent over stretching of wrists.  Pt was participating in OPOT prior to admission and needs to return when discharged.  Educated pt and wife in edema management for hands.  No further acute OT needs.    OT Assessment  All further OT needs can be met in the next venue of care    Follow Up Recommendations  Outpatient OT    Barriers to Discharge      Equipment Recommendations  None recommended by OT    Recommendations for Other Services    Frequency       Precautions / Restrictions Precautions Precautions: Fall Restrictions Weight Bearing Restrictions: No   Pertinent Vitals/Pain VSS, no pain    ADL  Eating/Feeding: Set up Where Assessed - Eating/Feeding: Chair Grooming: Set up Where Assessed - Grooming: Unsupported standing Upper Body Bathing: Modified independent Where Assessed - Upper Body Bathing: Unsupported sitting Lower Body Bathing: Modified independent Where Assessed - Lower Body Bathing: Unsupported sitting Upper Body Dressing: Modified independent Where Assessed - Upper Body Dressing: Unsupported sitting Lower Body Dressing: Modified independent Where Assessed - Lower Body Dressing: Supported sit to stand Toilet Transfer: Independent Statistician Method: Sit to Barista: Regular height toilet Toileting - Clothing Manipulation and Hygiene: Modified independent Where Assessed - Engineer, mining and Hygiene: Sit to stand from 3-in-1 or toilet Equipment Used: Upper extremity splints Transfers/Ambulation Related to ADLs: independent ADL Comments: Pt uses hook to pull up zipper on pants.  Pt has foam build ups for utensils at home, but does not use them anymore.    OT Diagnosis: Generalized weakness;Paresis  OT Problem List: Decreased strength;Decreased range of motion;Impaired sensation;Impaired UE functional use;Increased edema OT Treatment Interventions:     OT Goals(Current goals can be found in the care plan section) Acute Rehab OT Goals Patient Stated Goal: regain use of B hands  Visit Information  Last OT Received On: 05/02/13 Assistance Needed: +1 History of Present Illness: is a 62 year old male, with known  history of HTN, PAF, Query hypertrophic cardiomyopathy, hypothyroidism, Gout, GERD, s/p hospitalization 04/06/13-04/09/13 at [facility], for neck pain and bilateral UE weakness which was the result of a work related injury when he slipped down some stairs and hit his face/head causing a hyperextension injury to his neck, with resultant brachial plexopathy. Patient continued outpatient OT after discharge, and on 04/27/13, his therapist noticed that he had a painful swollen right elbow, that was oozing fluid. She recommended that he see a doctor. On 04/29/13, he felt very unwell, his spouse checked his blood pressure, and found it to be quite low. She called the doctor, and patient was advised to go to the ED.  According to transfer notes, he presented to [facility] [place] on 04/29/13, with generalized fever, chills, rigors and an infected , swollen right elbow, with draining wound, was found to be in septic shock, rapid atrial fibrillation, requiring DCCV in the ED, followed by hospitalization in the ICU, aggressive iv fluid resuscitation, pressor support and broad spectrum antibiotic therapy with IV Vancomycin and Levaquin       Prior Functioning     Home Living Family/patient expects to be discharged to:: Private residence Living Arrangements: Spouse/significant other Available Help at Discharge: Family;Available PRN/intermittently Type of Home: House Home Access: Level entry Home Layout: One level Home Equipment:  (B hand splints, B wrist cock up splints, foam build ups) Prior Function Level of Independence: Needs assistance ADL's / Homemaking Assistance Needed: assist to access fasteners and containers Communication Communication: No difficulties Dominant Hand: Right         Vision/Perception Vision - History Baseline Vision: Wears glasses all the time Patient Visual Report: No change from baseline   Cognition  Cognition Arousal/Alertness: Awake/alert Behavior During Therapy:  WFL for tasks assessed/performed Overall Cognitive Status: Within Functional Limits for tasks assessed    Extremity/Trunk Assessment Upper Extremity Assessment Upper Extremity Assessment: RUE deficits/detail;LUE deficits/detail RUE Deficits / Details: brachial plexus injury with residual wrist drop, weakness in forearm. RUE Sensation: decreased light touch (finger tips are numb) RUE Coordination: decreased fine motor LUE Deficits / Details: brachial plexus injury with residual wrist drop, weakness in elbow, shoulder, forearm LUE Sensation: decreased light touch LUE Coordination: decreased fine motor;decreased gross motor Lower Extremity Assessment Lower Extremity Assessment: Overall WFL for tasks assessed     Mobility Bed Mobility Bed Mobility: Not assessed Details for Bed Mobility Assistance: pt sitting in chair upon arrival. Transfers Transfers: Sit to Stand;Stand to Sit Sit to Stand: 6: Modified independent (Device/Increase time);With upper extremity assist Stand to Sit: 6: Modified independent (Device/Increase time);With upper extremity assist Details for Transfer Assistance: MOD I due to increased time as pt has weakness in bil UEs due to brachial plexus injuries.     Exercise     Balance     End of Session OT - End of Session Activity Tolerance: Patient tolerated treatment well Patient left: in chair;with call bell/phone within reach;with family/visitor present;with nursing/sitter in room Nurse Communication:  (requested IV sites moved so pt may wear splints)  GO     Evern Bio 05/02/2013, 2:40 PM [phone]

## 2013-05-02 NOTE — Progress Notes (Signed)
ANTICOAGULATION CONSULT NOTE - Follow Up Consult  Pharmacy Consult for heparin Indication: atrial fibrillation  Allergies  Allergen Reactions  . Penicillins     Unknown  Allergy since child    Patient Measurements: Height: 6' (182.9 cm) Weight: 229 lb 4.5 oz (104 kg) IBW/kg (Calculated) : 77.6 Heparin Dosing Weight: 91.8 kg (pt's dry wt PTA)  Vital Signs: Temp: 98.3 F (36.8 C) (10/01 0035) Temp src: Oral (10/01 0035) BP: 121/87 mmHg (10/01 0200) Pulse Rate: 88 (10/01 0035)  Labs:  Recent Labs  05/01/13 2317 05/02/13 0120  HGB 10.1*  --   HCT 28.1*  --   PLT 133*  --   HEPARINUNFRC  --  0.21*  CREATININE 0.77  --   TROPONINI 0.35*  --     Estimated Creatinine Clearance: 119.4 ml/min (by C-G formula based on Cr of 0.77).   Medical History: Past Medical History  Diagnosis Date  . Essential hypertension, benign   . Hypothyroidism   . Other and unspecified hyperlipidemia   . Gouty arthropathy, unspecified   . GERD (gastroesophageal reflux disease)   . Paroxysmal atrial fibrillation     Medications:  Prescriptions prior to admission  Medication Sig Dispense Refill  . Ascorbic Acid (VITAMIN C) 1000 MG tablet Take 1,000 mg by mouth daily.      . cetirizine (ZYRTEC) 10 MG tablet Take 10 mg by mouth as needed for allergies.       . febuxostat (ULORIC) 40 MG tablet Take 40 mg by mouth daily.      . fluticasone (FLONASE) 50 MCG/ACT nasal spray Place 2 sprays into the nose as needed for rhinitis or allergies.       . hydrochlorothiazide (HYDRODIURIL) 25 MG tablet Take 25 mg by mouth daily.      Marland Kitchen HYDROcodone-acetaminophen (NORCO/VICODIN) 5-325 MG per tablet Take 1-2 tablets by mouth every 4 (four) hours as needed.  60 tablet  0  . levothyroxine (SYNTHROID, LEVOTHROID) 50 MCG tablet Take 50 mcg by mouth daily.      Marland Kitchen lisinopril (PRINIVIL,ZESTRIL) 40 MG tablet Take 40 mg by mouth daily.      . pantoprazole (PROTONIX) 40 MG tablet Take 40 mg by mouth daily.       Marland Kitchen  triamcinolone cream (KENALOG) 0.1 % Apply 1 application topically as needed (dry legs).         Assessment: 62 yo M transferred from [facility] to start heparin per pharmacy for afib.  He was cardioverted 9/28 at [facility] for afib 2nd hypotension.   He has developed recurrent afib tonight and cardiology rec cardizem drip and heparin drip.   Wt PTA 91.8 kg.  H/H 14.6/40.6 and pltc 289.  Creat 1.19.  Initial heparin level 0.21 units/ml and was drawn ~4.5 hours after start.  Goal of Therapy:  Heparin level 0.3-0.7 units/ml Monitor platelets by anticoagulation protocol: Yes   Plan:  Increase heparin to 1550 units/hr Check heparin level 6 hours after rate change.  Talbert Cage, Pharm.D. [phone] 05/02/2013 2:46 AM

## 2013-05-02 NOTE — Progress Notes (Signed)
CRITICAL VALUE ALERT  Critical value received:  Troponin=0.35; Mg=1.3; K=2.8  Date of notification:  05/02/2013  Time of notification:  00:50 AM  Critical value read back:yes  Nurse who received alert:  Sander Radon RN  MD notified (1st page):  PA Craige Cotta  Time of first page:  00:51AM  MD notified (2nd page):  Time of second page:  Responding MD:  PA Craige Cotta  Time MD responded:  01:00AM

## 2013-05-02 NOTE — Evaluation (Signed)
I have reviewed this note and agree with all findings. Kati Reese Stockman, PT, DPT Pager: [phone]   

## 2013-05-02 NOTE — Plan of Care (Signed)
Problem: Phase I Progression Outcomes Goal: Ventricular heart rate < 120/min Outcome: Completed/Met Date Met:  05/02/13 Converted to NSR 05/02/2013 @ 00:04 am

## 2013-05-02 NOTE — Progress Notes (Addendum)
[facility] Progress Note [place] TEAM 1 - Stepdown/ICU TEAM   Andrew Compton YQM:[id number] DOB: 1951/04/01 DOA: 05/01/2013 PCP: Estanislado Pandy, MD  Admit HPI / Brief Narrative: 62 year old male with known history of HTN, PAF, query hypertrophic cardiomyopathy, hypothyroidism, gout, GERD, s/p hospitalization 04/06/13-04/09/13 at [facility] for neck pain and bilateral UE weakness which was the result of a work related injury when he slipped down some stairs and hit his face/head causing a hyperextension injury to his neck with resultant brachial plexopathy. Patient continued outpatient OT after discharge and on 04/27/13 his therapist noticed that he had a painful swollen right elbow that was oozing fluid. She recommended that he see a doctor. On 04/29/13 he felt unwell, his spouse checked his blood pressure, and found it to be quite low. She called the doctor, and patient was advised to go to the ED.  According to transfer notes, he presented to [facility] [place] on 04/29/13 with generalized fever, chills, rigors and an infected swollen right elbow.  He was found to be in septic shock, rapid atrial fibrillation, requiring DCCV in the ED, followed by hospitalization in the ICU, aggressive iv fluid resuscitation, pressor support and broad spectrum antibiotic therapy with IV Vancomycin and Levaquin. Clinical course was complicated by ARF and electrolyte derangements, including profound hypomagnesemia. Blood cultures and wound cultures were still negative at the time of transfer. Transfer labs revealed WBC 5.6, HB 9.5, PLTs 111, Sodium 136, potassium 3.6, chloride 106, CO2 26, BUN 18, creatinine 0.99, glucose 126.   Assessment/Plan:  Severe sepsis with septic shock - resolved admitted to [facility] [place] with septic shock, requiring pressor support, and broad spectrum antibiotic treatment - no culprit organisms identified - responded to therapy with iv Vancomycin/Levaquin -  sepsis has resolved  PAF (paroxysmal atrial fibrillation) has a known history of PAF - was administered DCCV in ED on 9/28 - had remained in NSR until 9/30 PM when converted back to afib w/ RVR - now controlled - Cardiology is following   History of cardiomyopathy has a history of query hypertrophic cardiomyopathy - 2D Echocardiogram at OSH was not c/w this hx and showed EF 55%-60%, mild w/ moderate MR and borderline concentric LVH - repeat echo per Cards  ARF (acute renal failure) Creatinine was 2.84 on 04/29/13, in the setting of hypotension and sepsis - ARF has resolved with aggressive iv fluids and avoidance of nephrotoxins, as well as brief pressor support  Infective bursitis, right elbow v/s superficial cellulitis  local inflammatory phenomena have practically resolved - X-Rays revealed no acute findings - ID consulted to suggest tx agent and course - no clear evidence of joint involvement at this time - call placed to his Orthopedist and discussed at length via phone - will formally consult if joint sx develop, but present course not c/w a true septic arthritis, or even bursitis   Hx of HTN (hypertension) BP currently well controlled   Gout Compensated   Brachial Plexopathy Ortho informed of hospitalization - cont PT/OT  GERD  Cont PPI  Hypothyroidism Cont home medical tx regimen  Hypokalemia Replace - Mg is borderline, so will replace it as well  Code Status: FULL Family Communication: spoke w/ pt and wife at bedside Disposition Plan: SDU until afib proves to be controlled   Consultants: Cardiology  ID  Procedures: none  Antibiotics: Levaquin 9/30 Vancomycin 9/30  Cefazolin 10/01 >>  DVT prophylaxis: SQ heparin  HPI/Subjective: Pt feels much better the time of  my exam.  Denies cp, sob, f/c, n/v, abdom pain, or even pain of the R elbow at present.  Objective: Blood pressure 126/94, pulse 86, temperature 98.4 F (36.9 C), temperature source Oral, resp.  rate 18, height 6' (1.829 m), weight 104 kg (229 lb 4.5 oz), SpO2 98.00%.  Intake/Output Summary (Last 24 hours) at 05/02/13 1132 Last data filed at 05/02/13 0804  Gross per 24 hour  Intake 266.23 ml  Output   4803 ml  Net -4536.77 ml    Exam: General: No acute respiratory distress Lungs: Clear to auscultation bilaterally without wheezes or crackles Cardiovascular: Regular rate and rhythm without murmur gallop or rub normal S1 and S2 Abdomen: Nontender, nondistended, soft, bowel sounds positive, no rebound, no ascites, no appreciable mass Extremities: No significant cyanosis, clubbing, or edema bilateral lower extremities  Data Reviewed: Basic Metabolic Panel:  Recent Labs Lab 05/01/13 2317 05/02/13 0527  NA  --  137  K 2.8* 3.0*  CL  --  100  CO2  --  28  GLUCOSE  --  117*  BUN  --  12  CREATININE 0.77 0.78  CALCIUM  --  8.7  MG 1.3* 1.6   Liver Function Tests:  Recent Labs Lab 05/02/13 0527  AST 18  ALT 41  ALKPHOS 42  BILITOT 0.4  PROT 5.5*  ALBUMIN 2.9*   CBC:  Recent Labs Lab 05/01/13 2317 05/02/13 0527  WBC 5.5 4.4  HGB 10.1* 9.8*  HCT 28.1* 27.2*  MCV 88.1 88.3  PLT 133* 128*   Cardiac Enzymes:  Recent Labs Lab 05/01/13 2317 05/02/13 0527  TROPONINI 0.35* <0.30    Recent Results (from the past 240 hour(s))  MRSA PCR SCREENING     Status: None   Collection Time    05/01/13  7:46 PM      Result Value Range Status   MRSA by PCR NEGATIVE  NEGATIVE Final   Comment:            The GeneXpert MRSA Assay (FDA     approved for NASAL specimens     only), is one component of a     comprehensive MRSA colonization     surveillance program. It is not     intended to diagnose MRSA     infection nor to guide or     monitor treatment for     MRSA infections.     Studies:  Recent x-ray studies have been reviewed in detail by the Attending Physician  Scheduled Meds:  Scheduled Meds: . diltiazem  180 mg Oral Daily  . febuxostat  40 mg  Oral Daily  . heparin subcutaneous  5,000 Units Subcutaneous Q8H  . levofloxacin  750 mg Oral QHS  . levothyroxine  50 mcg Oral QAC breakfast  . pantoprazole  40 mg Oral Daily  . sodium chloride  3 mL Intravenous Q12H  . vancomycin  1,000 mg Intravenous Q8H  . vitamin C  1,000 mg Oral Daily    Time spent on care of this patient: 35 mins   [facility]  [facility] Office  [phone] Pager - Text Page per Loretha Stapler as per below:  On-Call/Text Page:      [email or web address]      password [email or web address]  If 7PM-7AM, please contact night-coverage [email or web address] Password [email or web address] 05/02/2013, 11:32 AM   LOS: 1 day

## 2013-05-03 DIAGNOSIS — R6510 Systemic inflammatory response syndrome (SIRS) of non-infectious origin without acute organ dysfunction: Secondary | ICD-10-CM

## 2013-05-03 DIAGNOSIS — I359 Nonrheumatic aortic valve disorder, unspecified: Secondary | ICD-10-CM

## 2013-05-03 DIAGNOSIS — I5032 Chronic diastolic (congestive) heart failure: Secondary | ICD-10-CM

## 2013-05-03 DIAGNOSIS — Z87898 Personal history of other specified conditions: Secondary | ICD-10-CM

## 2013-05-03 DIAGNOSIS — A419 Sepsis, unspecified organism: Secondary | ICD-10-CM

## 2013-05-03 DIAGNOSIS — N179 Acute kidney failure, unspecified: Secondary | ICD-10-CM

## 2013-05-03 LAB — CBC
Hemoglobin: 10.8 g/dL — ABNORMAL LOW (ref 13.0–17.0)
MCH: 31.4 pg (ref 26.0–34.0)
MCHC: 35.4 g/dL (ref 30.0–36.0)
Platelets: 174 10*3/uL (ref 150–400)
RDW: 13.6 % (ref 11.5–15.5)
WBC: 3.6 10*3/uL — ABNORMAL LOW (ref 4.0–10.5)

## 2013-05-03 LAB — BASIC METABOLIC PANEL
GFR calc Af Amer: >90 mL/min (ref >90)
GFR calc non Af Amer: >90 mL/min (ref >90)
Potassium: 3.1 mEq/L — ABNORMAL LOW (ref 3.5–5.1)
Sodium: 138 mEq/L (ref 135–145)

## 2013-05-03 LAB — MAGNESIUM: Magnesium: 1.4 mg/dL — ABNORMAL LOW (ref 1.5–2.5)

## 2013-05-03 MED ORDER — METOPROLOL TARTRATE 1 MG/ML IV SOLN
2.5000 mg | INTRAVENOUS | Status: DC | PRN
  Administered 2013-05-03 – 2013-05-04 (×3): 2.5 mg via INTRAVENOUS
  Filled 2013-05-03 (×4): qty 5

## 2013-05-03 MED ORDER — POTASSIUM CHLORIDE CRYS ER 20 MEQ PO TBCR
40.0000 meq | EXTENDED_RELEASE_TABLET | Freq: Once | ORAL | Status: AC
  Administered 2013-05-03: 09:00:00 40 meq via ORAL
  Filled 2013-05-03: qty 2

## 2013-05-03 MED ORDER — APIXABAN 5 MG PO TABS
5.0000 mg | ORAL_TABLET | Freq: Two times a day (BID) | ORAL | Status: DC
  Administered 2013-05-03 – 2013-05-05 (×5): 5 mg via ORAL
  Filled 2013-05-03 (×7): qty 1

## 2013-05-03 MED ORDER — DILTIAZEM HCL ER COATED BEADS 240 MG PO CP24
240.0000 mg | ORAL_CAPSULE | Freq: Every day | ORAL | Status: DC
  Administered 2013-05-03 – 2013-05-05 (×3): 240 mg via ORAL
  Filled 2013-05-03 (×4): qty 1

## 2013-05-03 NOTE — Progress Notes (Signed)
    [facility]  Date of Admission:  05/01/2013  Antibiotics: Antibiotics Given (last 72 hours)   Date/Time Action Medication Dose Rate   05/01/13 2126 Given   levofloxacin (LEVAQUIN) tablet 750 mg 750 mg    05/01/13 2126 Given   vancomycin (VANCOCIN) IVPB 1000 mg/200 mL premix 1,000 mg 200 mL/hr   05/02/13 1610 Given   vancomycin (VANCOCIN) IVPB 1000 mg/200 mL premix 1,000 mg 200 mL/hr   05/02/13 1410 Given   ceFAZolin (ANCEF) IVPB 2 g/50 mL premix 2 g 100 mL/hr   05/02/13 2153 Given   ceFAZolin (ANCEF) IVPB 2 g/50 mL premix 2 g 100 mL/hr   05/03/13 9604 Given   ceFAZolin (ANCEF) IVPB 2 g/50 mL premix 2 g 100 mL/hr   05/03/13 1308 Given   ceFAZolin (ANCEF) IVPB 2 g/50 mL premix 2 g 100 mL/hr      Subjective: No complaints, no fever, no chills  Objective: Temp:  [98 F (36.7 C)-98.4 F (36.9 C)] 98 F (36.7 C) (10/02 1633) Pulse Rate:  [79-111] 88 (10/02 1633) Resp:  [16-20] 20 (10/02 1633) BP: (124-149)/(76-97) 124/80 mmHg (10/02 1633) SpO2:  [94 %-98 %] 98 % (10/02 1633) Weight:  [226 lb 10.1 oz (102.8 kg)] 226 lb 10.1 oz (102.8 kg) (10/02 0046)  General: Awake, alert, nad Skin: no rashes Lungs: CTA B Abdomen: soft, nt Ext: elbow with no edema, erythema  Lab Results Lab Results  Component Value Date   WBC 3.6* 05/03/2013   HGB 10.8* 05/03/2013   HCT 30.5* 05/03/2013   MCV 88.7 05/03/2013   PLT 174 05/03/2013    Lab Results  Component Value Date   CREATININE 0.74 05/03/2013   BUN 11 05/03/2013   NA 138 05/03/2013   K 3.1* 05/03/2013   CL 100 05/03/2013   CO2 26 05/03/2013    Lab Results  Component Value Date   ALT 41 05/02/2013   AST 18 05/02/2013   ALKPHOS 42 05/02/2013   BILITOT 0.4 05/02/2013      Microbiology: Recent Results (from the past 240 hour(s))  MRSA PCR SCREENING     Status: None   Collection Time    05/01/13  7:46 PM      Result Value Range Status   MRSA by PCR NEGATIVE  NEGATIVE Final   Comment:            The  GeneXpert MRSA Assay (FDA     approved for NASAL specimens     only), is one component of a     comprehensive MRSA colonization     surveillance program. It is not     intended to diagnose MRSA     infection nor to guide or     monitor treatment for     MRSA infections.    Studies/Results: No results found.  Assessment/Plan: 1) SIRS - resolved.  Had abscess, no known positive cultures.  Tolerating ancef.  Not c/w bursitis per ortho.   -keflex at discharge for about 5 days and stop  Staci Righter, MD [facility] for Infectious Disease [facility] [email or web address] [phone] pager   [phone] cell 05/03/2013, 5:16 PM

## 2013-05-03 NOTE — Progress Notes (Signed)
   Subjective:  Denies CP or dyspnea; no palpitations   Objective:  Filed Vitals:   05/02/13 2316 05/03/13 0011 05/03/13 0046 05/03/13 0543  BP: 149/97 134/76  139/86  Pulse: 110   79  Temp: 98.3 F (36.8 C)   98 F (36.7 C)  TempSrc: Oral   Oral  Resp: 16 18  20   Height:      Weight:   226 lb 10.1 oz (102.8 kg)   SpO2: 94% 96%  98%    Intake/Output from previous day:  Intake/Output Summary (Last 24 hours) at 05/03/13 0703 Last data filed at 05/03/13 0546  Gross per 24 hour  Intake    890 ml  Output   3850 ml  Net  -2960 ml    Physical Exam: Physical exam: Well-developed well-nourished in no acute distress.  Skin is warm and dry.  HEENT is normal.  Neck is supple.  Chest is clear to auscultation with normal expansion.  Cardiovascular exam is irregular Abdominal exam nontender or distended. No masses palpated. Extremities show trace edema. Right elbow dressing in place neuro grossly intact    Lab Results: Basic Metabolic Panel:  Recent Labs  16/10/96 0527 05/03/13 0532  NA 137 138  K 3.0* 3.1*  CL 100 100  CO2 28 26  GLUCOSE 117* 125*  BUN 12 11  CREATININE 0.78 0.74  CALCIUM 8.7 9.3  MG 1.6 1.4*   CBC:  Recent Labs  05/02/13 0527 05/03/13 0532  WBC 4.4 3.6*  HGB 9.8* 10.8*  HCT 27.2* 30.5*  MCV 88.3 88.7  PLT 128* 174   Cardiac Enzymes:  Recent Labs  05/01/13 2317 05/02/13 0527 05/02/13 1130  TROPONINI 0.35* <0.30 <0.30     Assessment/Plan:  1 paroxysmal atrial fibrillation-Back in atrial fibrillation; change cardizem to 240 mg CD po daily. Await echocardiogram; TSH normal. I think some of his atrial fibrillation is most likely being driven by the hyperadrenergic state associated with his infection, but he has had this previously and is most likely having PAF at home. He has embolic risk factor of hypertension. Will DC ASA and treat with apixaban 5 mg po BID 2 question history of hypertrophic obstructive cardiomyopathy-most recent  echocardiogram did not reveal this. Repeat echo.  3 hypertension-Follow BP on cardizem and adjust as needed. 4 elbow infection-continue antibiotics. Management per primary care. 5 Hypokalemia - supplement   Olga Millers 05/03/2013, 7:03 AM

## 2013-05-03 NOTE — Progress Notes (Signed)
[facility] Progress Note [place] TEAM 1 - Stepdown/ICU TEAM   Andrew Compton:[id number] DOB: Jan 02, 1951 DOA: 05/01/2013 PCP: Estanislado Pandy, MD  Admit HPI / Brief Narrative: 62 year old male with known history of HTN, PAF, query hypertrophic cardiomyopathy, hypothyroidism, gout, GERD, s/p hospitalization 04/06/13-04/09/13 at [facility] for neck pain and bilateral UE weakness which was the result of a work related injury when he slipped down some stairs and hit his face/head causing a hyperextension injury to his neck with resultant brachial plexopathy. Patient continued outpatient OT after discharge and on 04/27/13 his therapist noticed that he had a painful swollen right elbow that was oozing fluid. She recommended that he see a doctor. On 04/29/13 he felt unwell, his spouse checked his blood pressure, and found it to be quite low. She called the doctor, and patient was advised to go to the ED.  According to transfer notes, he presented to [facility] [place] on 04/29/13 with generalized fever, chills, rigors and an infected swollen right elbow.  He was found to be in septic shock, rapid atrial fibrillation, requiring DCCV in the ED, followed by hospitalization in the ICU, aggressive iv fluid resuscitation, pressor support and broad spectrum antibiotic therapy with IV Vancomycin and Levaquin. Clinical course was complicated by ARF and electrolyte derangements, including profound hypomagnesemia. Blood cultures and wound cultures were still negative at the time of transfer. Transfer labs revealed WBC 5.6, HB 9.5, PLTs 111, Sodium 136, potassium 3.6, chloride 106, CO2 26, BUN 18, creatinine 0.99, glucose 126.   Assessment/Plan:  Severe sepsis with septic shock - resolved admitted to [facility] [place] with septic shock, requiring pressor support, and broad spectrum antibiotic treatment - no culprit organisms identified - responded to therapy with iv Vancomycin/Levaquin -  sepsis has resolved  PAF (paroxysmal atrial fibrillation) has a known history of PAF - was administered DCCV in ED on 9/28 - had remained in NSR until 9/30 PM when converted back to afib w/ RVR -converted back to A-fib last night-   Cardiology is following- Cardizem dose increased and Apixaban started   History of cardiomyopathy has a history of query hypertrophic cardiomyopathy - 2D Echocardiogram at OSH was not c/w this hx and showed EF 55%-60%, mild w/ moderate MR and borderline concentric LVH - repeat echo per Cards  ARF (acute renal failure) Creatinine was 2.84 on 04/29/13, in the setting of hypotension and sepsis - ARF has resolved with aggressive iv fluids and avoidance of nephrotoxins, as well as brief pressor support  Infective bursitis, right elbow v/s superficial cellulitis  local inflammatory phenomena have practically resolved - X-Rays revealed no acute findings - ID consulted to suggest tx agent and course - no clear evidence of joint involvement at this time - call placed to his Orthopedist and discussed at length via phone - per ID, will be able to go home with Keflex.   Hx of HTN (hypertension) BP currently well controlled   Gout Compensated   Brachial Plexopathy Ortho informed of hospitalization - cont PT/OT  GERD  Cont PPI  Hypothyroidism Cont home medical tx regimen  Hypokalemia Replace - Mg is borderline, so will replace it as well  Code Status: FULL Family Communication: spoke w/ pt and wife at bedside Disposition Plan: SDU until afib proves to be controlled -  Consultants: Cardiology  ID  Procedures: none  Antibiotics: Levaquin 9/30 Vancomycin 9/30  Cefazolin 10/01 >>  DVT prophylaxis: SQ heparin  HPI/Subjective: Pt has no specific complaints- asking  if Foley can be removed. No elbow pain. Discussed the fact that he went back into A-fib last night.   Objective: Blood pressure 136/88, pulse 111, temperature 98 F (36.7 C), temperature  source Oral, resp. rate 20, height 6' (1.829 m), weight 102.8 kg (226 lb 10.1 oz), SpO2 98.00%.  Intake/Output Summary (Last 24 hours) at 05/03/13 1402 Last data filed at 05/03/13 0900  Gross per 24 hour  Intake    650 ml  Output   2600 ml  Net  -1950 ml    Exam: General: No acute respiratory distress Lungs: Clear to auscultation bilaterally without wheezes or crackles Cardiovascular: Regular rate and rhythm without murmur gallop or rub normal S1 and S2 Abdomen: Nontender, nondistended, soft, bowel sounds positive, no rebound, no ascites, no appreciable mass Extremities: No significant cyanosis, clubbing, or edema bilateral lower extremities  Data Reviewed: Basic Metabolic Panel:  Recent Labs Lab 05/01/13 2317 05/02/13 0527 05/03/13 0532  NA  --  137 138  K 2.8* 3.0* 3.1*  CL  --  100 100  CO2  --  28 26  GLUCOSE  --  117* 125*  BUN  --  12 11  CREATININE 0.77 0.78 0.74  CALCIUM  --  8.7 9.3  MG 1.3* 1.6 1.4*   Liver Function Tests:  Recent Labs Lab 05/02/13 0527  AST 18  ALT 41  ALKPHOS 42  BILITOT 0.4  PROT 5.5*  ALBUMIN 2.9*   CBC:  Recent Labs Lab 05/01/13 2317 05/02/13 0527 05/03/13 0532  WBC 5.5 4.4 3.6*  HGB 10.1* 9.8* 10.8*  HCT 28.1* 27.2* 30.5*  MCV 88.1 88.3 88.7  PLT 133* 128* 174   Cardiac Enzymes:  Recent Labs Lab 05/01/13 2317 05/02/13 0527 05/02/13 1130  TROPONINI 0.35* <0.30 <0.30    Recent Results (from the past 240 hour(s))  MRSA PCR SCREENING     Status: None   Collection Time    05/01/13  7:46 PM      Result Value Range Status   MRSA by PCR NEGATIVE  NEGATIVE Final   Comment:            The GeneXpert MRSA Assay (FDA     approved for NASAL specimens     only), is one component of a     comprehensive MRSA colonization     surveillance program. It is not     intended to diagnose MRSA     infection nor to guide or     monitor treatment for     MRSA infections.     Studies:  Recent x-ray studies have been  reviewed in detail by the Attending Physician  Scheduled Meds:  Scheduled Meds: . apixaban  5 mg Oral BID  .  ceFAZolin (ANCEF) IV  2 g Intravenous Q8H  . diltiazem  240 mg Oral Daily  . febuxostat  40 mg Oral Daily  . levothyroxine  50 mcg Oral QAC breakfast  . pantoprazole  40 mg Oral Daily  . sodium chloride  3 mL Intravenous Q12H  . vitamin C  1,000 mg Oral Daily    Time spent on care of this patient: 35 mins   Calvert Cantor, MD  [facility] Office  [phone] Pager - Text Page per Loretha Stapler as per below:  On-Call/Text Page:      [email or web address]      password [email or web address]  If 7PM-7AM, please contact night-coverage [email or web address] Password [email or web address] 05/03/2013, 2:02 PM   LOS: 2 days

## 2013-05-03 NOTE — Progress Notes (Signed)
At 22:12 Pt's. HR converted from NSR back to A.Fib with a rate 90's-130's. Pt is asymptomatic; V/S stable. PA Craige Cotta informed. Cardizem 5mg  IV given as ordered. PA informed after Cardizem given that there is no change in his rhythm & HR. Ordered Metoprolol 2.5 mg IV. While taking pt's. BP before Metoprolol given pt's HR converted  back to NSR; HR=80's @ 00:02 AM . Metoprolol held as ordered. Pt is asleep & asymptomatic. Will continue to monitor.

## 2013-05-04 DIAGNOSIS — I4891 Unspecified atrial fibrillation: Secondary | ICD-10-CM

## 2013-05-04 LAB — BASIC METABOLIC PANEL
Calcium: 9 mg/dL (ref 8.4–10.5)
Creatinine, Ser: 0.76 mg/dL (ref 0.50–1.35)
GFR calc non Af Amer: >90 mL/min (ref >90)
Potassium: 3.3 mEq/L — ABNORMAL LOW (ref 3.5–5.1)
Sodium: 137 mEq/L (ref 135–145)

## 2013-05-04 LAB — CBC
HCT: 29.9 % — ABNORMAL LOW (ref 39.0–52.0)
MCH: 32 pg (ref 26.0–34.0)
MCHC: 36.1 g/dL — ABNORMAL HIGH (ref 30.0–36.0)
Platelets: 180 10*3/uL (ref 150–400)
RBC: 3.37 MIL/uL — ABNORMAL LOW (ref 4.22–5.81)

## 2013-05-04 MED ORDER — POTASSIUM CHLORIDE CRYS ER 20 MEQ PO TBCR
40.0000 meq | EXTENDED_RELEASE_TABLET | Freq: Once | ORAL | Status: AC
  Administered 2013-05-04: 09:00:00 40 meq via ORAL
  Filled 2013-05-04: qty 2

## 2013-05-04 MED ORDER — MAGNESIUM SULFATE 40 MG/ML IJ SOLN
2.0000 g | Freq: Once | INTRAMUSCULAR | Status: AC
  Administered 2013-05-04: 17:00:00 2 g via INTRAVENOUS
  Filled 2013-05-04: qty 50

## 2013-05-04 MED ORDER — POTASSIUM CHLORIDE CRYS ER 20 MEQ PO TBCR
40.0000 meq | EXTENDED_RELEASE_TABLET | Freq: Two times a day (BID) | ORAL | Status: DC
  Administered 2013-05-04 – 2013-05-05 (×2): 40 meq via ORAL
  Filled 2013-05-04 (×4): qty 2

## 2013-05-04 MED ORDER — METOPROLOL TARTRATE 25 MG PO TABS
25.0000 mg | ORAL_TABLET | Freq: Two times a day (BID) | ORAL | Status: DC
  Administered 2013-05-04 (×2): 25 mg via ORAL
  Filled 2013-05-04 (×4): qty 1

## 2013-05-04 NOTE — Progress Notes (Signed)
[facility] Progress Note [place] TEAM 1 - Stepdown/ICU TEAM   Andrew Compton ZOX:[id number] DOB: 04/01/1951 DOA: 05/01/2013 PCP: Estanislado Pandy, MD  Admit HPI / Brief Narrative: 62 year old male with known history of HTN, PAF, query hypertrophic cardiomyopathy, hypothyroidism, gout, GERD, s/p hospitalization 04/06/13-04/09/13 at [facility] for neck pain and bilateral UE weakness which was the result of a work related injury when he slipped down some stairs and hit his face/head causing a hyperextension injury to his neck with resultant brachial plexopathy. Patient continued outpatient OT after discharge and on 04/27/13 his therapist noticed that he had a painful swollen right elbow that was oozing fluid. She recommended that he see a doctor. On 04/29/13 he felt unwell, his spouse checked his blood pressure, and found it to be quite low. She called the doctor, and patient was advised to go to the ED.  According to transfer notes, he presented to [facility] [place] on 04/29/13 with generalized fever, chills, rigors and an infected swollen right elbow.  He was found to be in septic shock, rapid atrial fibrillation, requiring DCCV in the ED, followed by hospitalization in the ICU, aggressive iv fluid resuscitation, pressor support and broad spectrum antibiotic therapy with IV Vancomycin and Levaquin. Clinical course was complicated by ARF and electrolyte derangements, including profound hypomagnesemia. Blood cultures and wound cultures were still negative at the time of transfer. Transfer labs revealed WBC 5.6, HB 9.5, PLTs 111, Sodium 136, potassium 3.6, chloride 106, CO2 26, BUN 18, creatinine 0.99, glucose 126.   Assessment/Plan:  Severe sepsis with septic shock - resolved admitted to [facility] [place] with septic shock, requiring pressor support, and broad spectrum antibiotic treatment - no culprit organisms identified - responded to therapy with iv Vancomycin/Levaquin -  sepsis has resolved  PAF (paroxysmal atrial fibrillation) has a known history of PAF - was administered DCCV in ED on 9/28 - had remained in NSR until 9/30 PM when converted back to afib w/ RVR - Cardiology is following- Cardizem dose increased and Apixaban started - BB added 10/3  History of cardiomyopathy - disproven has a history of query hypertrophic cardiomyopathy - 2D Echocardiogram at OSH was not c/w this hx and showed EF 55%-60%, mild w/ moderate MR and borderline concentric LVH - repeat echo per Cards also does not support this   ARF (acute renal failure) - resolved Creatinine was 2.84 on 04/29/13, in the setting of hypotension and sepsis - ARF has resolved with aggressive iv fluids and avoidance of nephrotoxins, as well as brief pressor support  Infective bursitis, right elbow v/s superficial cellulitis  local inflammatory phenomena have resolved - X-Rays revealed no acute findings - ID consulted to suggest tx agent and course - no clear evidence of joint involvement at this time - call placed to his Orthopedist and discussed at length via phone - per ID, will be able to go home with Keflex  Hx of HTN No change in tx plan today   Gout Compensated   Brachial Plexopathy Ortho informed of hospitalization - cont PT/OT  GERD  Cont PPI  Hypothyroidism Cont home medical tx regimen  Hypokalemia persists - Mg was borderline, so replaced it as well - cont to supplement and f/u in AM  Code Status: FULL Family Communication: spoke w/ pt and wife at bedside Disposition Plan: SDU until afib proves to be controlled - possible D/C 10/4  Consultants: Cardiology  ID  Procedures: none  Antibiotics: Levaquin 9/30 Vancomycin 9/30  Cefazolin  10/01 >>  DVT prophylaxis: SQ heparin  HPI/Subjective: Pt has no specific complaints. No elbow pain. Discussed the need for anticoagulant use.   Objective: Blood pressure 150/94, pulse 116, temperature 98.2 F (36.8 C), temperature  source Oral, resp. rate 20, height 6' (1.829 m), weight 102.8 kg (226 lb 10.1 oz), SpO2 98.00%.  Intake/Output Summary (Last 24 hours) at 05/04/13 1426 Last data filed at 05/04/13 0900  Gross per 24 hour  Intake    530 ml  Output   1127 ml  Net   -597 ml   Exam: General: No acute respiratory distress Lungs: Clear to auscultation bilaterally without wheezes or crackles Cardiovascular: Irreg irreg without murmur gallop or rub normal S1 and S2 Abdomen: Nontender, nondistended, soft, bowel sounds positive, no rebound, no ascites, no appreciable mass Extremities: No significant cyanosis, clubbing, or edema bilateral lower extremities  Data Reviewed: Basic Metabolic Panel:  Recent Labs Lab 05/01/13 2317 05/02/13 0527 05/03/13 0532 05/04/13 0455  NA  --  137 138 137  K 2.8* 3.0* 3.1* 3.3*  CL  --  100 100 100  CO2  --  28 26 26   GLUCOSE  --  117* 125* 124*  BUN  --  12 11 11   CREATININE 0.77 0.78 0.74 0.76  CALCIUM  --  8.7 9.3 9.0  MG 1.3* 1.6 1.4*  --    Liver Function Tests:  Recent Labs Lab 05/02/13 0527  AST 18  ALT 41  ALKPHOS 42  BILITOT 0.4  PROT 5.5*  ALBUMIN 2.9*   CBC:  Recent Labs Lab 05/01/13 2317 05/02/13 0527 05/03/13 0532 05/04/13 0455  WBC 5.5 4.4 3.6* 4.4  HGB 10.1* 9.8* 10.8* 10.8*  HCT 28.1* 27.2* 30.5* 29.9*  MCV 88.1 88.3 88.7 88.7  PLT 133* 128* 174 180   Cardiac Enzymes:  Recent Labs Lab 05/01/13 2317 05/02/13 0527 05/02/13 1130  TROPONINI 0.35* <0.30 <0.30    Recent Results (from the past 240 hour(s))  MRSA PCR SCREENING     Status: None   Collection Time    05/01/13  7:46 PM      Result Value Range Status   MRSA by PCR NEGATIVE  NEGATIVE Final   Comment:            The GeneXpert MRSA Assay (FDA     approved for NASAL specimens     only), is one component of a     comprehensive MRSA colonization     surveillance program. It is not     intended to diagnose MRSA     infection nor to guide or     monitor treatment for      MRSA infections.     Studies:  Recent x-ray studies have been reviewed in detail by the Attending Physician  Scheduled Meds:  Scheduled Meds: . apixaban  5 mg Oral BID  .  ceFAZolin (ANCEF) IV  2 g Intravenous Q8H  . diltiazem  240 mg Oral Daily  . febuxostat  40 mg Oral Daily  . levothyroxine  50 mcg Oral QAC breakfast  . metoprolol tartrate  25 mg Oral BID  . pantoprazole  40 mg Oral Daily  . vitamin C  1,000 mg Oral Daily    Time spent on care of this patient: 35 mins   Karsten Howry T, MD  [facility] Office  [phone] Pager - Text Page per Loretha Stapler as per below:  On-Call/Text Page:      [email or web address]      password  [email or web address]  If 7PM-7AM, please contact night-coverage [email or web address] Password [email or web address] 05/04/2013, 2:26 PM   LOS: 3 days

## 2013-05-04 NOTE — Progress Notes (Signed)
Utilization Review Completed Keavon Sensing J. Kacelyn Rowzee, RN, BSN, NCM [phone]  

## 2013-05-04 NOTE — Progress Notes (Signed)
At 03:03 am pt's HR converted from NSR back to A-fib with a rate of 110's-150's.  Pt asymptomatic, SBP elevated, prn dose of   metoprolol 2.5mg  given IV----  After metoprolol pt still in afib with a rate of 70's to 80's & SBP 151/95.  Will continue to monitor patient.

## 2013-05-04 NOTE — Progress Notes (Signed)
   Subjective:  Denies CP or dyspnea; palpitations and weakness this AM   Objective:  Filed Vitals:   05/03/13 1633 05/03/13 2000 05/03/13 2330 05/04/13 0435  BP: 124/80 146/99 158/98 129/92  Pulse: 88 83 83   Temp: 98 F (36.7 C) 98.5 F (36.9 C) 98.2 F (36.8 C) 98.4 F (36.9 C)  TempSrc: Oral Oral Oral Oral  Resp: 20     Height:      Weight:      SpO2: 98% 98% 97% 99%    Intake/Output from previous day:  Intake/Output Summary (Last 24 hours) at 05/04/13 0704 Last data filed at 05/04/13 0100  Gross per 24 hour  Intake    820 ml  Output   2176 ml  Net  -1356 ml    Physical Exam: Physical exam: Well-developed well-nourished in no acute distress.  Skin is warm and dry.  HEENT is normal.  Neck is supple.  Chest is clear to auscultation with normal expansion.  Cardiovascular exam is irregular Abdominal exam nontender or distended. No masses palpated. Extremities show trace edema. neuro grossly intact    Lab Results: Basic Metabolic Panel:  Recent Labs  16/10/96 0527 05/03/13 0532 05/04/13 0455  NA 137 138 137  K 3.0* 3.1* 3.3*  CL 100 100 100  CO2 28 26 26   GLUCOSE 117* 125* 124*  BUN 12 11 11   CREATININE 0.78 0.74 0.76  CALCIUM 8.7 9.3 9.0  MG 1.6 1.4*  --    CBC:  Recent Labs  05/03/13 0532 05/04/13 0455  WBC 3.6* 4.4  HGB 10.8* 10.8*  HCT 30.5* 29.9*  MCV 88.7 88.7  PLT 174 180   Cardiac Enzymes:  Recent Labs  05/01/13 2317 05/02/13 0527 05/02/13 1130  TROPONINI 0.35* <0.30 <0.30     Assessment/Plan:  1 paroxysmal atrial fibrillation-Back in atrial fibrillation; continue cardizem; add metoprolol 25 BID; if PAF continues and he becomes symptomatic will consider antiarrhythmic (most likely flecanide).  Echocardiogram with normal LV function; TSH normal. I think some of his atrial fibrillation is most likely being driven by the hyperadrenergic state associated with his infection, but he has had this previously and is most likely  having PAF at home. He has embolic risk factor of hypertension. Continue apixaban 5 mg po BID 2 question history of hypertrophic obstructive cardiomyopathy-no evidence on echo. 3 hypertension-controlled 4 elbow infection-continue antibiotics. Management per primary care. 5 Hypokalemia - supplement   Olga Millers 05/04/2013, 7:04 AM

## 2013-05-05 LAB — BASIC METABOLIC PANEL
CO2: 24 mEq/L (ref 19–32)
Chloride: 103 mEq/L (ref 96–112)
Creatinine, Ser: 0.72 mg/dL (ref 0.50–1.35)
GFR calc Af Amer: >90 mL/min (ref >90)
GFR calc non Af Amer: >90 mL/min (ref >90)
Potassium: 3.9 mEq/L (ref 3.5–5.1)
Sodium: 142 mEq/L (ref 135–145)

## 2013-05-05 LAB — CBC
Platelets: 197 10*3/uL (ref 150–400)
RBC: 3.44 MIL/uL — ABNORMAL LOW (ref 4.22–5.81)
RDW: 13.9 % (ref 11.5–15.5)
WBC: 7 10*3/uL (ref 4.0–10.5)

## 2013-05-05 LAB — MAGNESIUM: Magnesium: 1.7 mg/dL (ref 1.5–2.5)

## 2013-05-05 MED ORDER — CEPHALEXIN 500 MG PO CAPS: 500.0000 mg | ORAL_CAPSULE | Freq: Two times a day (BID) | ORAL | Status: DC

## 2013-05-05 MED ORDER — DILTIAZEM HCL ER COATED BEADS 240 MG PO CP24: 240.0000 mg | ORAL_CAPSULE | Freq: Every day | ORAL | Status: DC

## 2013-05-05 MED ORDER — APIXABAN 5 MG PO TABS: 5.0000 mg | ORAL_TABLET | Freq: Two times a day (BID) | ORAL | Status: DC

## 2013-05-05 MED ORDER — METOPROLOL TARTRATE 50 MG PO TABS
50.0000 mg | ORAL_TABLET | Freq: Two times a day (BID) | ORAL | Status: DC
  Administered 2013-05-05: 50 mg via ORAL
  Filled 2013-05-05 (×2): qty 1

## 2013-05-05 MED ORDER — CEPHALEXIN 500 MG PO CAPS
500.0000 mg | ORAL_CAPSULE | Freq: Two times a day (BID) | ORAL | Status: DC
Start: 2013-05-05 — End: 2013-05-05
  Administered 2013-05-05: 11:00:00 500 mg via ORAL
  Filled 2013-05-05 (×2): qty 1

## 2013-05-05 MED ORDER — METOPROLOL TARTRATE 50 MG PO TABS: 50.0000 mg | ORAL_TABLET | Freq: Two times a day (BID) | ORAL | Status: DC

## 2013-05-05 NOTE — Progress Notes (Addendum)
   Subjective:  Denies CP or dyspnea   Objective:  Filed Vitals:   05/05/13 0020 05/05/13 0410 05/05/13 0739 05/05/13 0741  BP:  136/93 130/100 134/99  Pulse:  89 94   Temp:  98.3 F (36.8 C) 98.7 F (37.1 C)   TempSrc:  Oral Oral   Resp:  18 18   Height:      Weight: 233 lb 14.5 oz (106.1 kg)     SpO2:  99% 100%     Intake/Output from previous day:  Intake/Output Summary (Last 24 hours) at 05/05/13 0746 Last data filed at 05/05/13 1610  Gross per 24 hour  Intake    820 ml  Output   1103 ml  Net   -283 ml    Physical Exam: Physical exam: Well-developed well-nourished in no acute distress.  Skin is warm and dry.  HEENT is normal.  Neck is supple.  Chest is clear to auscultation with normal expansion.  Cardiovascular exam is irregular Abdominal exam nontender or distended. No masses palpated. Extremities show trace edema. neuro grossly intact    Lab Results: Basic Metabolic Panel:  Recent Labs  96/04/54 0532 05/04/13 0455  NA 138 137  K 3.1* 3.3*  CL 100 100  CO2 26 26  GLUCOSE 125* 124*  BUN 11 11  CREATININE 0.74 0.76  CALCIUM 9.3 9.0  MG 1.4*  --    CBC:  Recent Labs  05/04/13 0455 05/05/13 0641  WBC 4.4 7.0  HGB 10.8* 10.7*  HCT 29.9* 30.9*  MCV 88.7 89.8  PLT 180 197   Cardiac Enzymes:  Recent Labs  05/02/13 1130  TROPONINI <0.30     Assessment/Plan:  1 paroxysmal atrial fibrillation-Remains in atrial fibrillation; HR high normal; continue cardizem; increase metoprolol to 50 BID. Echocardiogram with normal LV function; TSH normal. I think some of his atrial fibrillation is most likely being driven by the hyperadrenergic state associated with his infection, but he has had this previously and is most likely having PAF at home. He has embolic risk factor of hypertension. Continue apixaban 5 mg po BID. FU with me in 4 weeks; if afib persists, will make decision at that time concerning rhythm vs rate control. Check CBC and BMET in 4  weeks 2 question history of hypertrophic obstructive cardiomyopathy-no evidence on echo. 3 hypertension-controlled 4 elbow infection-continue antibiotics. Management per primary care. 5 Hypokalemia - supplement Patient can be DCed from a cardiac standpoint.  Olga Millers 05/05/2013, 7:46 AM

## 2013-05-05 NOTE — Discharge Summary (Signed)
DISCHARGE SUMMARY  Andrew Compton  MR#: [id number]  DOB:06/20/1951  Date of Admission: 05/01/2013 Date of Discharge: 05/05/2013  Attending Physician:Idalie Canto T   Patient's WUJ:WJXBJY,NWGN W, MD  Consults: Cardiology ID  Disposition: D/C Home   Follow-up Appts:     Follow-up Information   Follow up with Olga Millers, MD. Schedule an appointment as soon as possible for a visit in 4 weeks.   Specialty:  Cardiology   Contact information:   [address] [place] [address] [phone]       Follow up with Estanislado Pandy, MD. Schedule an appointment as soon as possible for a visit in 4 days.   Specialty:  Cardiology   Contact information:   [address] [place] [address] [phone]       Tests Needing Follow-up: Repeat BMET to f/u on K+ is suggested - clinical eval of R elbow is suggested - assessment of HR is suggested   Discharge Diagnoses: Severe sepsis with septic shock - resolved  PAF (paroxysmal atrial fibrillation)  History of cardiomyopathy - disproven   ARF (acute renal failure) - resolved  Infective bursitis, right elbow v/s superficial cellulitis  Hx of HTN  Gout  Brachial Plexopathy GERD  Hypothyroidism  Hypokalemia   Initial presentation: 62 year old male with known history of HTN, PAF, query hypertrophic cardiomyopathy, hypothyroidism, gout, GERD, s/p hospitalization 04/06/13-04/09/13 at [facility] for neck pain and bilateral UE weakness which was the result of a work related injury when he slipped down some stairs and hit his face/head causing a hyperextension injury to his neck with resultant brachial plexopathy. Patient continued outpatient OT/PT after discharge and on 04/27/13 his therapist noticed that he had a painful swollen right elbow that was oozing fluid. She recommended that he see a doctor. On 04/29/13 he felt unwell, his spouse checked his blood pressure, and found it to be quite low. She called the doctor, and patient  was advised to go to the ED.   According to transfer notes, he presented to [facility] on 04/29/13 with generalized fever, chills, rigors and an infected swollen right elbow. He was found to be in septic shock, rapid atrial fibrillation requiring DCCV in the ED, followed by hospitalization in the ICU with aggressive iv fluid resuscitation, pressor support and broad spectrum antibiotic therapy with IV Vancomycin and Levaquin. Clinical course was complicated by ARF and electrolyte derangements, including profound hypomagnesemia. Blood cultures and wound cultures were still negative at the time of transfer. Transfer labs revealed WBC 5.6, HB 9.5, PLTs 111, Sodium 136, potassium 3.6, chloride 106, CO2 26, BUN 18, creatinine 0.99, glucose 126.   Hospital Course:  Severe sepsis with septic shock - resolved  admitted to [facility] with septic shock, requiring pressor support, and broad spectrum antibiotic treatment - no culprit organisms identified - responded to therapy with iv Vancomycin/Levaquin - sepsis resolved by the time pt arrived at [facility]  PAF (paroxysmal atrial fibrillation)  has a known history of PAF - was administered DCCV in OSH ED on 9/28 - had remained in NSR until 9/30 PM when converted back to afib w/ RVR - Cardiology followed - Cardizem initiated and titrated and Apixaban started - BB added 10/3 - HR controlled in 80-100 range at time of d/c - CM has confirmed pt will have access to Apixaban after d/c - to f/u w/ Cards in 4 weeks   History of cardiomyopathy - disproven  has a reported  history of query hypertrophic cardiomyopathy - 2D Echocardiogram at OSH was not c/w this hx and showed EF 55%-60%, mild MR and borderline concentric LVH - repeat echo per Cards at [facility] also debunked this diagnosis, confirming EF 60-65% with normal LV wall thickness and cavity size w/o WMA  ARF (acute renal failure) - resolved  Creatinine was 2.84 on  04/29/13, in the setting of hypotension and sepsis - ARF resolved with aggressive iv fluids and avoidance of nephrotoxins, as well as brief pressor support - crt 0.72 at time of D/C   Infective bursitis, right elbow v/s superficial cellulitis  local inflammatory phenomena resolved - X-Rays revealed no acute findings - ID consulted to suggest tx agent and course - no clear evidence of joint involvement at this time - call placed to his Orthopedist and discussed at length via phone - per ID will be able to go home with Keflex x 5 additional days of tx   Hx of HTN  No change in tx plan   Gout  Compensated   Brachial Plexopathy  Ortho informed of hospitalization - cont PT/OT - f/u as oupt as planned   GERD  Cont PPI   Hypothyroidism  Cont home medical tx regimen   Hypokalemia  Replaced - K 3.9 at time of d/c - will not resume HCTZ - Mg was borderline, so replaced it as well, with Mg 1.7 at time of d/c     Medication List    STOP taking these medications       hydrochlorothiazide 25 MG tablet  Commonly known as:  HYDRODIURIL      TAKE these medications       apixaban 5 MG Tabs tablet  Commonly known as:  ELIQUIS  Take 1 tablet (5 mg total) by mouth 2 (two) times daily.     cephALEXin 500 MG capsule  Commonly known as:  KEFLEX  Take 1 capsule (500 mg total) by mouth every 12 (twelve) hours.     cetirizine 10 MG tablet  Commonly known as:  ZYRTEC  Take 10 mg by mouth as needed for allergies.     diltiazem 240 MG 24 hr capsule  Commonly known as:  CARDIZEM CD  Take 1 capsule (240 mg total) by mouth daily.     febuxostat 40 MG tablet  Commonly known as:  ULORIC  Take 40 mg by mouth daily.     fluticasone 50 MCG/ACT nasal spray  Commonly known as:  FLONASE  Place 2 sprays into the nose as needed for rhinitis or allergies.     HYDROcodone-acetaminophen 5-325 MG per tablet  Commonly known as:  NORCO/VICODIN  Take 1-2 tablets by mouth every 4 (four) hours as needed.      levothyroxine 50 MCG tablet  Commonly known as:  SYNTHROID, LEVOTHROID  Take 50 mcg by mouth daily.     lisinopril 40 MG tablet  Commonly known as:  PRINIVIL,ZESTRIL  Take 40 mg by mouth daily.     metoprolol 50 MG tablet  Commonly known as:  LOPRESSOR  Take 1 tablet (50 mg total) by mouth 2 (two) times daily.     pantoprazole 40 MG tablet  Commonly known as:  PROTONIX  Take 40 mg by mouth daily.     triamcinolone cream 0.1 %  Commonly known as:  KENALOG  Apply 1 application topically as needed (dry legs).     vitamin C 1000 MG tablet  Take 1,000 mg by mouth daily.  Day of Discharge BP 134/99  Pulse 94  Temp(Src) 98.7 F (37.1 C) (Oral)  Resp 18  Ht 6' (1.829 m)  Wt 106.1 kg (233 lb 14.5 oz)  BMI 31.72 kg/m2  SpO2 100%  Physical Exam: General: No acute respiratory distress Lungs: Clear to auscultation bilaterally without wheezes or crackles Cardiovascular: Regular rate without murmur gallop or rub normal S1 and S2 Abdomen: Nontender, nondistended, soft, bowel sounds positive, no rebound, no ascites, no appreciable mass Extremities: No significant cyanosis, clubbing, or edema bilateral lower extremities - R elbow w/ no erythema or calor (essentially looks normal)  Results for orders placed during the hospital encounter of 05/01/13 (from the past 24 hour(s))  CBC     Status: Abnormal   Collection Time    05/05/13  6:41 AM      Result Value Range   WBC 7.0  4.0 - 10.5 K/uL   RBC 3.44 (*) 4.22 - 5.81 MIL/uL   Hemoglobin 10.7 (*) 13.0 - 17.0 g/dL   HCT 40.9 (*) 81.1 - 91.4 %   MCV 89.8  78.0 - 100.0 fL   MCH 31.1  26.0 - 34.0 pg   MCHC 34.6  30.0 - 36.0 g/dL   RDW 78.2  95.6 - 21.3 %   Platelets 197  150 - 400 K/uL  BASIC METABOLIC PANEL     Status: Abnormal   Collection Time    05/05/13  6:41 AM      Result Value Range   Sodium 142  135 - 145 mEq/L   Potassium 3.9  3.5 - 5.1 mEq/L   Chloride 103  96 - 112 mEq/L   CO2 24  19 - 32 mEq/L   Glucose,  Bld 112 (*) 70 - 99 mg/dL   BUN 13  6 - 23 mg/dL   Creatinine, Ser 0.86  0.50 - 1.35 mg/dL   Calcium 8.7  8.4 - 57.8 mg/dL   GFR calc non Af Amer >90  >90 mL/min   GFR calc Af Amer >90  >90 mL/min  MAGNESIUM     Status: None   Collection Time    05/05/13  6:41 AM      Result Value Range   Magnesium 1.7  1.5 - 2.5 mg/dL    Time spent in discharge (includes decision making & examination of pt): >30 minutes  05/05/2013, 11:03 AM   Lonia Blood, MD [facility] Office  [phone] Pager [phone]  On-Call/Text Page:      [email or web address]      password [facility]

## 2013-05-05 NOTE — Care Management Note (Signed)
    Page 1 of 1   05/05/2013     11:16:08 AM   CARE MANAGEMENT NOTE 05/05/2013  Patient:  Andrew Compton, Andrew Compton   Account Number:  [id number]  Date Initiated:  05/04/2013  Documentation initiated by:  Oletta Cohn  Subjective/Objective Assessment:   62 yo male transferred from [facility] with chills, rigors, infected swollen elbow, rapid afib//home with spouse     Action/Plan:   heart cath//home with spouse   Anticipated DC Date:  05/05/2013   Anticipated DC Plan:  HOME/SELF CARE      DC Planning Services  CM consult      Choice offered to / List presented to:             Status of service:  Completed, signed off Medicare Important Message given?   (If response is "NO", the following Medicare IM given date fields will be blank) Date Medicare IM given:   Date Additional Medicare IM given:    Discharge Disposition:  HOME/SELF CARE  Per UR Regulation:    If discussed at Long Length of Stay Meetings, dates discussed:    Comments:  05/05/13 11:00 CM brought free trial card for Eliquis to pt in room and gave them the results of the benefit check done 05/04/13 by CMA.  Pt understands as long as he gets him med in network, he has no co-pay and he does not need pre-auth for medication.  No other CM needs were communicated. Freddy Jaksch, BSN, [place] [phone].  05/04/13  Oletta Cohn, RN, BSN, NCM [phone] Awaiting benefits check for Eliquis 5mg .  Spouse:  Rosey Bath [phone].

## 2013-05-08 NOTE — Progress Notes (Signed)
Utilization Review Completed Heavenlee Maiorana J. Annmargaret Decaprio, RN, BSN, NCM [phone]  

## 2013-06-05 ENCOUNTER — Encounter (INDEPENDENT_AMBULATORY_CARE_PROVIDER_SITE_OTHER): Payer: Self-pay | Admitting: *Deleted

## 2013-06-08 ENCOUNTER — Ambulatory Visit (INDEPENDENT_AMBULATORY_CARE_PROVIDER_SITE_OTHER): Payer: Managed Care, Other (non HMO) | Admitting: Cardiology

## 2013-06-08 ENCOUNTER — Encounter: Payer: Self-pay | Admitting: Cardiology

## 2013-06-08 VITALS — BP 146/103 | HR 82 | Ht 72.0 in

## 2013-06-08 DIAGNOSIS — I10 Essential (primary) hypertension: Secondary | ICD-10-CM

## 2013-06-08 DIAGNOSIS — I480 Paroxysmal atrial fibrillation: Secondary | ICD-10-CM

## 2013-06-08 DIAGNOSIS — I4891 Unspecified atrial fibrillation: Secondary | ICD-10-CM

## 2013-06-08 MED ORDER — DILTIAZEM HCL ER COATED BEADS 240 MG PO CP24: 240.0000 mg | ORAL_CAPSULE | Freq: Every day | ORAL | Status: DC

## 2013-06-08 MED ORDER — LISINOPRIL 20 MG PO TABS: 20.0000 mg | ORAL_TABLET | Freq: Every day | ORAL | Status: DC

## 2013-06-08 MED ORDER — APIXABAN 5 MG PO TABS: 5.0000 mg | ORAL_TABLET | Freq: Two times a day (BID) | ORAL | Status: DC

## 2013-06-08 MED ORDER — METOPROLOL TARTRATE 50 MG PO TABS: ORAL_TABLET | ORAL | Status: DC

## 2013-06-08 NOTE — Assessment & Plan Note (Signed)
Patient remains in atrial fibrillation today. He is asymptomatic. There is no dyspnea, chest pain or palpitations. He has embolic risk factor of hypertension. He is also nearing the age of 6. I have explained the option of aspirin versus anticoagulation. I have recommended continuing apixaban. We also discussed option of rate control/anticoagulation versus rhythm control. Given that he is asymptomatic I would favor rate control. Continue Cardizem at present dose. Increase metoprolol to 75 mg by mouth twice a day. He may require surgical intervention for his dislocated shoulder. Once this is corrected we can consider cardioversion in the future if he develops symptoms.

## 2013-06-08 NOTE — Assessment & Plan Note (Signed)
Patient's blood pressure is mildly elevated but typically controlled at home. I am increasing metoprolol for rate control. I will therefore decrease his lisinopril to 20 mg daily. He also has edema from IV fluids during his recent hospitalization. This is improving. He will contact us next week if it does not resolve and we will give low-dose diuretic for short amount of time.

## 2013-06-08 NOTE — Progress Notes (Signed)
HPI: followup atrial fibrillation. Patient previously seen by Dr. Andee Lineman and has had asymptomatic atrial fibrillation in the past. 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 He then developed sepsis. During the admission he was found to have recurrent paroxysmal atrial fibrillation. Echocardiogram in October of 2014 showed normal LV function. There was mild left atrial enlargement, mild right ventricular enlargement and mild aortic insufficiency. TSH 2.098. Patient was treated with Cardizem and metoprolol for rate control. He was placed on apixaban. He was placed on Septra one week ago for recurrent infection in his right upper extremity. He then developed hypotension and was readmitted in [place]. It was felt that his hypotension was most likely related to a combination of infection and allergic reaction to Septra. He was given IV fluids and ultimately discharged 2 days later. At present he denies dyspnea, chest pain, palpitations or syncope. He has pedal edema that he attributes to IV fluids when he was recently hospitalized. He has dislocated his right shoulder that occurred when he fell prior to his recent hospitalization.   Current Outpatient Prescriptions  Medication Sig Dispense Refill  . apixaban (ELIQUIS) 5 MG TABS tablet Take 1 tablet (5 mg total) by mouth 2 (two) times daily.  60 tablet  1  . Ascorbic Acid (VITAMIN C) 1000 MG tablet Take 1,000 mg by mouth daily.      . cephALEXin (KEFLEX) 500 MG capsule Take 1 capsule (500 mg total) by mouth every 12 (twelve) hours.  12 capsule  0  . cetirizine (ZYRTEC) 10 MG tablet Take 10 mg by mouth as needed for allergies.       Marland Kitchen diltiazem (CARDIZEM CD) 240 MG 24 hr capsule Take 1 capsule (240 mg total) by mouth daily.  30 capsule  1  . febuxostat (ULORIC) 40 MG tablet Take 40 mg by mouth daily.      . fluticasone (FLONASE) 50 MCG/ACT nasal spray Place 2 sprays into the nose as needed for  rhinitis or allergies.       Marland Kitchen HYDROcodone-acetaminophen (NORCO/VICODIN) 5-325 MG per tablet Take 1-2 tablets by mouth every 4 (four) hours as needed.  60 tablet  0  . levothyroxine (SYNTHROID, LEVOTHROID) 50 MCG tablet Take 50 mcg by mouth daily.      Marland Kitchen lisinopril (PRINIVIL,ZESTRIL) 40 MG tablet Take 40 mg by mouth daily.      . metoprolol (LOPRESSOR) 50 MG tablet Take 1 tablet (50 mg total) by mouth 2 (two) times daily.  60 tablet  1  . pantoprazole (PROTONIX) 40 MG tablet Take 40 mg by mouth daily.       . pregabalin (LYRICA) 75 MG capsule Take 75 mg by mouth as directed. Pt is stopping this medication      . triamcinolone cream (KENALOG) 0.1 % Apply 1 application topically as needed (dry legs).        No current facility-administered medications for this visit.     Past Medical History  Diagnosis Date  . Essential hypertension, benign   . Hypothyroidism   . Other and unspecified hyperlipidemia   . Gouty arthropathy, unspecified   . GERD (gastroesophageal reflux disease)   . Paroxysmal atrial fibrillation     Past Surgical History  Procedure Laterality Date  . No past surgeries      History   Social History  . Marital Status: Married    Spouse Name: N/A    Number of Children: N/A  . Years  of Education: N/A   Occupational History  .      [employer]   Social History Main Topics  . Smoking status: Former Smoker    Quit date: 08/02/1996  . Smokeless tobacco: Never Used  . Alcohol Use: Yes     Comment: Occasional  . Drug Use: No  . Sexual Activity: Not on file   Other Topics Concern  . Not on file   Social History Narrative   Has no children    ROS: no fevers or chills, productive cough, hemoptysis, dysphasia, odynophagia, melena, hematochezia, dysuria, hematuria, rash, seizure activity, orthopnea, PND, pedal edema, claudication. Remaining systems are negative.  Physical Exam: Well-developed well-nourished in no acute distress.  Skin is warm and dry.    HEENT is normal.  Neck is supple.  Chest is clear to auscultation with normal expansion.  Cardiovascular exam is irregular Abdominal exam nontender or distended. No masses palpated. Extremities show 1+ ankle edema. Right shoulder in sling. neuro grossly intact  ECG atrial fibrillation at a rate of 90. Left ventricular hypertrophy.

## 2013-06-08 NOTE — Patient Instructions (Signed)
Your physician recommends that you schedule a follow-up appointment in: 12 WEEKS WITH DR CRENSHAW  DECREASE LISINOPRIL TO 20 MG ONCE DAILY  INCREASE METOPROLOL TO ONE AND ONE HALF TABLETS TWICE DAILY

## 2013-06-12 ENCOUNTER — Telehealth: Payer: Self-pay | Admitting: Cardiology

## 2013-06-12 NOTE — Telephone Encounter (Signed)
Records rec From [facility] gave to [employer]  11/11/14km

## 2013-06-12 NOTE — Telephone Encounter (Signed)
ROI faxed to [facility] at [phone]

## 2013-06-19 ENCOUNTER — Encounter: Payer: Self-pay | Admitting: *Deleted

## 2013-07-18 ENCOUNTER — Telehealth: Payer: Self-pay | Admitting: Cardiology

## 2013-07-18 DIAGNOSIS — R609 Edema, unspecified: Secondary | ICD-10-CM

## 2013-07-18 NOTE — Telephone Encounter (Signed)
New message    Still having problems with fluid retention---want [place] presc [facility]  [phone]

## 2013-07-18 NOTE — Telephone Encounter (Signed)
Left message for pt to call.

## 2013-07-19 ENCOUNTER — Telehealth: Payer: Self-pay | Admitting: Cardiology

## 2013-07-19 DIAGNOSIS — R609 Edema, unspecified: Secondary | ICD-10-CM

## 2013-07-19 MED ORDER — FUROSEMIDE 20 MG PO TABS: 20.0000 mg | ORAL_TABLET | Freq: Every day | ORAL | Status: DC

## 2013-07-19 NOTE — Telephone Encounter (Signed)
Pt states the prescription was called into [facility] -- the Rx that was canceled.  Please call the RX back into [facility] in [place] [phone]// Pt requests that this message goes dirrectly to [place]. Please assist.

## 2013-07-19 NOTE — Telephone Encounter (Signed)
Spoke with pt, Aware of dr Ludwig Clarks recommendations. Script sent to the pharm. Labs ordered

## 2013-07-19 NOTE — Telephone Encounter (Signed)
Spoke with pt, script sent to the right pharm

## 2013-07-19 NOTE — Telephone Encounter (Signed)
Lasix 20 mg daily; bmet and BNP in one week Andrew Compton

## 2013-07-19 NOTE — Telephone Encounter (Signed)
Spoke with pt, when he was at the last office visit he had some swelling and it seems to be getting worse. The edema is there all the time but worse in the evening. He is not SOB. He is allergic to sulfa and PCN. Will forward for dr Jens Som review

## 2013-07-27 ENCOUNTER — Other Ambulatory Visit (INDEPENDENT_AMBULATORY_CARE_PROVIDER_SITE_OTHER): Payer: Self-pay | Admitting: Internal Medicine

## 2013-07-29 ENCOUNTER — Other Ambulatory Visit (INDEPENDENT_AMBULATORY_CARE_PROVIDER_SITE_OTHER): Payer: Self-pay | Admitting: Internal Medicine

## 2013-07-29 MED ORDER — PANTOPRAZOLE SODIUM 40 MG PO TBEC: 40.0000 mg | DELAYED_RELEASE_TABLET | Freq: Every day | ORAL | Status: DC

## 2013-07-31 ENCOUNTER — Telehealth: Payer: Self-pay | Admitting: *Deleted

## 2013-07-31 ENCOUNTER — Other Ambulatory Visit (INDEPENDENT_AMBULATORY_CARE_PROVIDER_SITE_OTHER): Payer: Managed Care, Other (non HMO)

## 2013-07-31 DIAGNOSIS — R609 Edema, unspecified: Secondary | ICD-10-CM

## 2013-07-31 DIAGNOSIS — E876 Hypokalemia: Secondary | ICD-10-CM

## 2013-07-31 LAB — BASIC METABOLIC PANEL
BUN: 12 mg/dL (ref 6–23)
CO2: 33 mEq/L — ABNORMAL HIGH (ref 19–32)
Calcium: 9.7 mg/dL (ref 8.4–10.5)
Creatinine, Ser: 0.9 mg/dL (ref 0.4–1.5)
GFR: 86.1 mL/min (ref >60.00)
Glucose, Bld: 112 mg/dL — ABNORMAL HIGH (ref 70–99)
Potassium: 3.3 mEq/L — ABNORMAL LOW (ref 3.5–5.1)

## 2013-07-31 LAB — BRAIN NATRIURETIC PEPTIDE: Pro B Natriuretic peptide (BNP): 146 pg/mL — ABNORMAL HIGH (ref 0.0–100.0)

## 2013-07-31 MED ORDER — POTASSIUM CHLORIDE ER 10 MEQ PO TBCR: 10.0000 meq | EXTENDED_RELEASE_TABLET | Freq: Every day | ORAL | Status: DC

## 2013-07-31 NOTE — Telephone Encounter (Signed)
Discussed with Dr Delton See and recommending starting K+10 meq daily recheck labs in 2 weeks, advised patient

## 2013-08-14 ENCOUNTER — Other Ambulatory Visit: Payer: Managed Care, Other (non HMO)

## 2013-08-14 ENCOUNTER — Other Ambulatory Visit: Payer: Self-pay

## 2013-08-20 ENCOUNTER — Other Ambulatory Visit: Payer: Self-pay

## 2013-08-20 DIAGNOSIS — E876 Hypokalemia: Secondary | ICD-10-CM

## 2013-08-20 MED ORDER — POTASSIUM CHLORIDE ER 10 MEQ PO TBCR: 10.0000 meq | EXTENDED_RELEASE_TABLET | Freq: Every day | ORAL | Status: DC

## 2013-08-23 ENCOUNTER — Other Ambulatory Visit (INDEPENDENT_AMBULATORY_CARE_PROVIDER_SITE_OTHER): Payer: Managed Care, Other (non HMO)

## 2013-08-23 DIAGNOSIS — E876 Hypokalemia: Secondary | ICD-10-CM

## 2013-08-23 LAB — BASIC METABOLIC PANEL
BUN: 14 mg/dL (ref 6–23)
CO2: 30 meq/L (ref 19–32)
Calcium: 9.5 mg/dL (ref 8.4–10.5)
Chloride: 103 mEq/L (ref 96–112)
Creatinine, Ser: 1 mg/dL (ref 0.4–1.5)
GFR: 83.01 mL/min (ref >60.00)
Glucose, Bld: 110 mg/dL — ABNORMAL HIGH (ref 70–99)
Potassium: 3.2 mEq/L — ABNORMAL LOW (ref 3.5–5.1)
SODIUM: 142 meq/L (ref 135–145)

## 2013-08-28 ENCOUNTER — Other Ambulatory Visit: Payer: Self-pay | Admitting: *Deleted

## 2013-08-28 DIAGNOSIS — E876 Hypokalemia: Secondary | ICD-10-CM

## 2013-08-28 MED ORDER — POTASSIUM CHLORIDE ER 20 MEQ PO TBCR: 20.0000 meq | EXTENDED_RELEASE_TABLET | Freq: Every day | ORAL | Status: DC

## 2013-08-31 ENCOUNTER — Ambulatory Visit: Payer: Managed Care, Other (non HMO) | Admitting: Cardiology

## 2013-09-10 ENCOUNTER — Encounter: Payer: Self-pay | Admitting: Cardiology

## 2013-10-02 ENCOUNTER — Ambulatory Visit (INDEPENDENT_AMBULATORY_CARE_PROVIDER_SITE_OTHER): Payer: Managed Care, Other (non HMO) | Admitting: Cardiology

## 2013-10-02 ENCOUNTER — Encounter: Payer: Self-pay | Admitting: Cardiology

## 2013-10-02 VITALS — BP 136/81 | HR 79 | Ht 72.0 in | Wt 211.4 lb

## 2013-10-02 DIAGNOSIS — I10 Essential (primary) hypertension: Secondary | ICD-10-CM

## 2013-10-02 DIAGNOSIS — I4891 Unspecified atrial fibrillation: Secondary | ICD-10-CM

## 2013-10-02 DIAGNOSIS — I480 Paroxysmal atrial fibrillation: Secondary | ICD-10-CM

## 2013-10-02 DIAGNOSIS — R609 Edema, unspecified: Secondary | ICD-10-CM

## 2013-10-02 MED ORDER — FUROSEMIDE 20 MG PO TABS: 20.0000 mg | ORAL_TABLET | Freq: Every day | ORAL | Status: DC

## 2013-10-02 NOTE — Assessment & Plan Note (Signed)
Patient remains in atrial fibrillation. He is asymptomatic. Plan rate control and anti-coagulation. Continue Cardizem, metoprolol and apixaban. Plan 24-hour Holter monitor to make sure that rate is controlled.

## 2013-10-02 NOTE — Patient Instructions (Signed)
Your physician wants you to follow-up in: 6 MONTHS WITH DR CRENSHAW You will receive a reminder letter in the mail two months in advance. If you don't receive a letter, please call our office to schedule the follow-up appointment.   Your physician has recommended that you wear a 24 HOUR holter monitor. Holter monitors are medical devices that record the heart's electrical activity. Doctors most often use these monitors to diagnose arrhythmias. Arrhythmias are problems with the speed or rhythm of the heartbeat. The monitor is a small, portable device. You can wear one while you do your normal daily activities. This is usually used to diagnose what is causing palpitations/syncope (passing out).   

## 2013-10-02 NOTE — Assessment & Plan Note (Signed)
Blood pressure controlled. Continue present medications. 

## 2013-10-02 NOTE — Progress Notes (Signed)
HPI: followup atrial fibrillation. Patient previously seen by Dr. Dannielle Burn and has had asymptomatic atrial fibrillation in the past. 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 He then developed sepsis. During the admission he was found to have recurrent paroxysmal atrial fibrillation. Echocardiogram in October of 2014 showed normal LV function. There was mild left atrial enlargement, mild right ventricular enlargement and mild aortic insufficiency. TSH 2.098. Patient was treated with Cardizem and metoprolol for rate control. He was placed on apixaban. At last OV in Nov 2014, we elected rate control and anticoagulation. Since then, the patient denies any dyspnea on exertion, orthopnea, PND, palpitations, syncope or chest pain. Occasional mild pedal edema.    Current Outpatient Prescriptions  Medication Sig Dispense Refill  . apixaban (ELIQUIS) 5 MG TABS tablet Take 1 tablet (5 mg total) by mouth 2 (two) times daily.  180 tablet  3  . Ascorbic Acid (VITAMIN C) 1000 MG tablet Take 1,000 mg by mouth daily.      Marland Kitchen diltiazem (CARDIZEM CD) 240 MG 24 hr capsule Take 1 capsule (240 mg total) by mouth daily.  90 capsule  4  . febuxostat (ULORIC) 40 MG tablet Take 40 mg by mouth daily.      . fluticasone (FLONASE) 50 MCG/ACT nasal spray Place 2 sprays into the nose as needed for rhinitis or allergies.       . furosemide (LASIX) 20 MG tablet Take 1 tablet (20 mg total) by mouth daily.  30 tablet  12  . levothyroxine (SYNTHROID, LEVOTHROID) 50 MCG tablet Take 50 mcg by mouth daily.      Marland Kitchen lisinopril (PRINIVIL,ZESTRIL) 20 MG tablet Take 1 tablet (20 mg total) by mouth daily.  90 tablet  4  . metoprolol (LOPRESSOR) 50 MG tablet Take one and one half tablets twice daily  270 tablet  4  . pantoprazole (PROTONIX) 40 MG tablet Take 1 tablet (40 mg total) by mouth daily.  90 tablet  3  . potassium chloride 20 MEQ TBCR Take 20 mEq by mouth daily.  90 tablet  3  .  ranitidine (ZANTAC) 150 MG tablet daily.      . tamsulosin (FLOMAX) 0.4 MG CAPS capsule 2 (two) times daily.      Marland Kitchen triamcinolone cream (KENALOG) 0.1 % Apply 1 application topically as needed (dry legs).        No current facility-administered medications for this visit.     Past Medical History  Diagnosis Date  . Essential hypertension, benign   . Hypothyroidism   . Other and unspecified hyperlipidemia   . Gouty arthropathy, unspecified   . GERD (gastroesophageal reflux disease)   . Paroxysmal atrial fibrillation     Past Surgical History  Procedure Laterality Date  . No past surgeries      History   Social History  . Marital Status: Married    Spouse Name: N/A    Number of Children: N/A  . Years of Education: N/A   Occupational History  .      [employer]   Social History Main Topics  . Smoking status: Former Smoker    Quit date: 08/02/1996  . Smokeless tobacco: Never Used  . Alcohol Use: Yes     Comment: Occasional  . Drug Use: No  . Sexual Activity: Not on file   Other Topics Concern  . Not on file   Social History Narrative   Has no children  ROS: no fevers or chills, productive cough, hemoptysis, dysphasia, odynophagia, melena, hematochezia, dysuria, hematuria, rash, seizure activity, orthopnea, PND, claudication. Remaining systems are negative.  Physical Exam: Well-developed well-nourished in no acute distress.  Skin is warm and dry.  HEENT is normal.  Neck is supple.  Chest is clear to auscultation with normal expansion.  Cardiovascular exam is irregular Abdominal exam nontender or distended. No masses palpated. Extremities show trace edema. neuro grossly intact  ECG atrial fibrillationat a rate of 79. Left ventricular hypertrophy.

## 2013-10-09 ENCOUNTER — Encounter (INDEPENDENT_AMBULATORY_CARE_PROVIDER_SITE_OTHER): Payer: Managed Care, Other (non HMO)

## 2013-10-09 ENCOUNTER — Encounter: Payer: Self-pay | Admitting: Radiology

## 2013-10-09 DIAGNOSIS — R609 Edema, unspecified: Secondary | ICD-10-CM

## 2013-10-09 DIAGNOSIS — I480 Paroxysmal atrial fibrillation: Secondary | ICD-10-CM

## 2013-10-09 DIAGNOSIS — I4891 Unspecified atrial fibrillation: Secondary | ICD-10-CM

## 2013-10-09 NOTE — Progress Notes (Signed)
Patient ID: Andrew Compton, male   DOB: 31-Jan-1951, 63 y.o.   MRN: [id number] E Cardio 24hr holter applied

## 2013-10-15 ENCOUNTER — Telehealth: Payer: Self-pay | Admitting: *Deleted

## 2013-10-15 NOTE — Telephone Encounter (Signed)
Monitor reviewed by dr Stanford Breed shows sinus with pac's and pvc's. Left message of results for pt

## 2013-10-24 ENCOUNTER — Encounter (INDEPENDENT_AMBULATORY_CARE_PROVIDER_SITE_OTHER): Payer: Self-pay | Admitting: *Deleted

## 2013-11-08 ENCOUNTER — Telehealth: Payer: Self-pay | Admitting: Cardiology

## 2013-11-08 NOTE — Telephone Encounter (Signed)
Returned call to patient he stated he is scheduled for rt shoulder surgery at [facility] 11/27/13.Stated wants to know if ok to stop eliquis and when he should stop.Message sent to [facility] for advice.

## 2013-11-08 NOTE — Telephone Encounter (Signed)
DC apixaban 3 days prior to procedure and resume the day after if ok with surgery Lelon Perla

## 2013-11-08 NOTE — Telephone Encounter (Signed)
New Message:  Pt states he is scheduled for nerve surgery on his right shoulder on April 28th [facility] - Dr. Truman Hayward... He is calling to get instructions on how to take his elloquis.

## 2013-11-12 NOTE — Telephone Encounter (Signed)
Spoke with pt, Aware of dr crenshaw's recommendations.  °

## 2013-11-27 HISTORY — PX: NEUROLYSIS MEDIAN NERVE: SUR892

## 2013-12-07 ENCOUNTER — Encounter: Payer: Self-pay | Admitting: Cardiology

## 2013-12-07 NOTE — Telephone Encounter (Signed)
This encounter was created in error - please disregard.

## 2013-12-07 NOTE — Telephone Encounter (Signed)
New message     Talk to you about eliquis

## 2013-12-11 ENCOUNTER — Telehealth: Payer: Self-pay | Admitting: *Deleted

## 2013-12-11 NOTE — Telephone Encounter (Signed)
Received letter from [employer] stating that after 12/31/2013 patients ELIQUIS may not be covered to call [phone], spoke with representative that states this medication is covered for patient at 60 tab for 30 days. She did a trial pharmacy run with no problems.

## 2014-02-11 ENCOUNTER — Ambulatory Visit (INDEPENDENT_AMBULATORY_CARE_PROVIDER_SITE_OTHER): Payer: Managed Care, Other (non HMO) | Admitting: Internal Medicine

## 2014-02-11 ENCOUNTER — Encounter (INDEPENDENT_AMBULATORY_CARE_PROVIDER_SITE_OTHER): Payer: Self-pay | Admitting: Internal Medicine

## 2014-02-11 VITALS — BP 120/90 | HR 78 | Temp 98.0°F | Resp 18 | Ht 72.0 in | Wt 205.7 lb

## 2014-02-11 DIAGNOSIS — K2100 Gastro-esophageal reflux disease with esophagitis, without bleeding: Secondary | ICD-10-CM

## 2014-02-11 DIAGNOSIS — K2270 Barrett's esophagus without dysplasia: Secondary | ICD-10-CM

## 2014-02-11 NOTE — Progress Notes (Signed)
Presenting complaint;  Followup for chronic GERD complicated by Barrett's.  Subjective:  Patient is 63 year old Caucasian male who has chronic GERD and complicated by 5 cm long Barrett's esophagus who presents for scheduled visit. He was last seen on June 2014. He states as far as his GI system is concerned he is doing well. In the last one year he is at 3 for episodes of dysphagia but he believes he was eating fast and did not chew his food. Food bolus when she passed on. He rarely has heartburn. He also denies sore throat chronic cough and has visual hoarseness which she believes is due to allergies. He fell at work and suffered injuries to his upper extremities and shoulders. He is on medical leave. He had repair for right rotator cuff on 420 at 2015 and still has limited movement and is scheduled to have surgery on the left side for same on 02/26/2014. He is able to drive though. He has good appetite but he has changed eating habits and has lost 26 pounds since his last visit. He is not having any side effects with pantoprazole.   Current Medications: Outpatient Encounter Prescriptions as of 02/11/2014  Medication Sig  . apixaban (ELIQUIS) 5 MG TABS tablet Take 1 tablet (5 mg total) by mouth 2 (two) times daily.  . Ascorbic Acid (VITAMIN C) 1000 MG tablet Take 1,000 mg by mouth daily.  Marland Kitchen diltiazem (CARDIZEM CD) 240 MG 24 hr capsule Take 1 capsule (240 mg total) by mouth daily.  . febuxostat (ULORIC) 40 MG tablet Take 40 mg by mouth daily.  . flecainide (TAMBOCOR) 50 MG tablet Take 50 mg by mouth once.   . fluticasone (FLONASE) 50 MCG/ACT nasal spray Place 2 sprays into the nose as needed for rhinitis or allergies.   . furosemide (LASIX) 20 MG tablet Take 1 tablet (20 mg total) by mouth daily.  Marland Kitchen levothyroxine (SYNTHROID, LEVOTHROID) 50 MCG tablet Take 50 mcg by mouth daily.  Marland Kitchen lisinopril (PRINIVIL,ZESTRIL) 20 MG tablet Take 1 tablet (20 mg total) by mouth daily.  . metoprolol (LOPRESSOR) 50  MG tablet Take one and one half tablets twice daily  . pantoprazole (PROTONIX) 40 MG tablet Take 1 tablet (40 mg total) by mouth daily.  . potassium chloride 20 MEQ TBCR Take 20 mEq by mouth daily.  . tamsulosin (FLOMAX) 0.4 MG CAPS capsule 0.4 mg daily. Prn  . triamcinolone cream (KENALOG) 0.1 % Apply 1 application topically as needed (dry legs).   . [DISCONTINUED] ranitidine (ZANTAC) 150 MG tablet daily.     Objective: Blood pressure 120/90, pulse 78, temperature 98 F (36.7 C), temperature source Oral, resp. rate 18, height 6' (1.829 m), weight 205 lb 11.2 oz (93.305 kg). Patient is alert and in no acute distress. Conjunctiva is pink. Sclera is nonicteric Oropharyngeal mucosa is normal. No neck masses or thyromegaly noted. Cardiac exam with regular rhythm normal S1 and S2. No murmur or gallop noted. Lungs are clear to auscultation. Abdomen is soft and nontender without organomegaly or masses.  No LE edema or clubbing noted. He has a wrap over left wrist.    Assessment:  #1. Chronic GERD complicated by 5 cm long Barrett's and esophageal stricture.  Stricture was last dilated in March 2012 to 18 mm with balloon. That is was biopsied in March 2012 and was negative for dysplasia. H. pylori serology was negative in November 2011. #2. He is average risk for CRC and up-to-date on screening. Last exam was in November 2011  with removal of 2 small polyps from sigmoid colon and these were hyperplastic.  Plan:  Patient will continue anti-reflex measures and pantoprazole at 40 mg by mouth every morning. He will need prescription refill and December 2015. Office visit in one year at which time we'll consider EGD with biopsy unless he develops dysphagia in which case he would need EGD earlier.

## 2014-02-11 NOTE — Patient Instructions (Signed)
Call if pantoprazole quits working. 

## 2014-02-14 ENCOUNTER — Telehealth: Payer: Self-pay | Admitting: Cardiology

## 2014-02-14 NOTE — Telephone Encounter (Signed)
Left message for pt, prior auth was [facility]ed [facility] today. Will wait to hear from them. He will call with other questions.

## 2014-02-14 NOTE — Telephone Encounter (Signed)
New problem   Pt need to speak to you concerning his prescription.

## 2014-04-16 ENCOUNTER — Encounter: Payer: Self-pay | Admitting: Cardiology

## 2014-04-16 ENCOUNTER — Ambulatory Visit (INDEPENDENT_AMBULATORY_CARE_PROVIDER_SITE_OTHER): Payer: Managed Care, Other (non HMO) | Admitting: Cardiology

## 2014-04-16 VITALS — BP 128/82 | HR 64 | Ht 72.0 in | Wt 211.9 lb

## 2014-04-16 DIAGNOSIS — I480 Paroxysmal atrial fibrillation: Secondary | ICD-10-CM

## 2014-04-16 DIAGNOSIS — I10 Essential (primary) hypertension: Secondary | ICD-10-CM

## 2014-04-16 DIAGNOSIS — I4891 Unspecified atrial fibrillation: Secondary | ICD-10-CM

## 2014-04-16 MED ORDER — DILTIAZEM HCL ER COATED BEADS 360 MG PO CP24: 360.0000 mg | ORAL_CAPSULE | Freq: Every day | ORAL | Status: DC

## 2014-04-16 NOTE — Assessment & Plan Note (Signed)
Blood pressure controlled. Continue present medications. 

## 2014-04-16 NOTE — Assessment & Plan Note (Signed)
Patient in sinus rhythm today. Continue apixaban. Check Hgb and renal function when he sees his primary care. Difficulties with impotence. Decrease metoprolol to 50 mg twice a day for 3 days, then 25 mg twice a day for 3 days and then discontinue. We will then increase Cardizem to 360 mg daily.

## 2014-04-16 NOTE — Patient Instructions (Signed)
Your physician wants you to follow-up in: [place] will receive a reminder letter in the mail two months in advance. If you don't receive a letter, please call our office to schedule the follow-up appointment.   METOPROLOL TAKE 50 MG TWICE DAILY X 3 DAYS THEN TAKE 25 MG TWICE DAILY X 3 DAYS THEN STOP  THE DAY AFTER STOPPING METOPROLOL INCREASE DILTIAZEM TO 360 MG ONCE DAILY  CBC/BMP LAB WORK WITH YOUR PRIMARY CARE DOCTOR

## 2014-04-16 NOTE — Progress Notes (Signed)
HPI: followup atrial fibrillation. 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 He then developed sepsis. During the admission he was found to have recurrent paroxysmal atrial fibrillation. Echocardiogram in October of 2014 showed normal LV function. There was mild left atrial enlargement, mild right ventricular enlargement and mild aortic insufficiency. TSH 2.098. Patient was treated with Cardizem and metoprolol for rate control. He was placed on apixaban. He is treated with  rate control and anticoagulation. Monitor 3/15 showed sinus with pacs and PVCs. Since last seen, the patient denies any dyspnea on exertion, orthopnea, PND, pedal edema, palpitations, syncope or chest pain.    Current Outpatient Prescriptions  Medication Sig Dispense Refill  . apixaban (ELIQUIS) 5 MG TABS tablet Take 1 tablet (5 mg total) by mouth 2 (two) times daily.  180 tablet  3  . Ascorbic Acid (VITAMIN C) 1000 MG tablet Take 1,000 mg by mouth daily.      Marland Kitchen diltiazem (CARDIZEM CD) 240 MG 24 hr capsule Take 1 capsule (240 mg total) by mouth daily.  90 capsule  4  . febuxostat (ULORIC) 40 MG tablet Take 40 mg by mouth daily.      . fluticasone (FLONASE) 50 MCG/ACT nasal spray Place 2 sprays into the nose as needed for rhinitis or allergies.       . furosemide (LASIX) 20 MG tablet Take 1 tablet (20 mg total) by mouth daily.  90 tablet  4  . levothyroxine (SYNTHROID, LEVOTHROID) 50 MCG tablet Take 50 mcg by mouth daily.      Marland Kitchen lisinopril (PRINIVIL,ZESTRIL) 20 MG tablet Take 1 tablet (20 mg total) by mouth daily.  90 tablet  4  . metoprolol (LOPRESSOR) 50 MG tablet Take one and one half tablets twice daily  270 tablet  4  . pantoprazole (PROTONIX) 40 MG tablet Take 1 tablet (40 mg total) by mouth daily.  90 tablet  3  . potassium chloride 20 MEQ TBCR Take 20 mEq by mouth daily.  90 tablet  3  . tamsulosin (FLOMAX) 0.4 MG CAPS capsule 0.4 mg daily. Prn      . triamcinolone  cream (KENALOG) 0.1 % Apply 1 application topically as needed (dry legs).        No current facility-administered medications for this visit.     Past Medical History  Diagnosis Date  . Essential hypertension, benign   . Hypothyroidism   . Other and unspecified hyperlipidemia   . Gouty arthropathy, unspecified   . GERD (gastroesophageal reflux disease)   . Paroxysmal atrial fibrillation     Past Surgical History  Procedure Laterality Date  . No past surgeries    . Neurolysis median nerve Right 11/27/2013    Neurolysis Brachial Plexus    History   Social History  . Marital Status: Married    Spouse Name: N/A    Number of Children: N/A  . Years of Education: N/A   Occupational History  .      [employer]   Social History Main Topics  . Smoking status: Former Smoker    Quit date: 08/02/1996  . Smokeless tobacco: Never Used  . Alcohol Use: Yes     Comment: Occasional  . Drug Use: No  . Sexual Activity: Not on file   Other Topics Concern  . Not on file   Social History Narrative   Has no children    ROS: no fevers or chills, productive cough, hemoptysis, dysphasia,  odynophagia, melena, hematochezia, dysuria, hematuria, rash, seizure activity, orthopnea, PND, pedal edema, claudication. Remaining systems are negative.  Physical Exam: Well-developed well-nourished in no acute distress.  Skin is warm and dry.  HEENT is normal.  Neck is supple.  Chest is clear to auscultation with normal expansion.  Cardiovascular exam is regular rate and rhythm.  Abdominal exam nontender or distended. No masses palpated. Extremities show no edema. neuro grossly intact  ECG Sinus rhythm at a rate of 64. Left atrial enlargement. No ST changes to

## 2014-04-25 ENCOUNTER — Telehealth: Payer: Self-pay | Admitting: Cardiology

## 2014-04-25 NOTE — Telephone Encounter (Signed)
His prescription for Diltiazem was cancelled by [facility].Pt asked to speak to [facility].

## 2014-04-25 NOTE — Telephone Encounter (Signed)
Spoke with pt, his diltiazem was changes to 360 mg and it was denied by [facility] Discussed with [facility] and clarification given and meds will be dispensed  Pt aware

## 2014-04-26 ENCOUNTER — Other Ambulatory Visit: Payer: Self-pay | Admitting: Cardiology

## 2014-07-15 ENCOUNTER — Encounter: Payer: Self-pay | Admitting: Cardiology

## 2014-07-17 ENCOUNTER — Other Ambulatory Visit: Payer: Self-pay | Admitting: Cardiology

## 2014-07-25 ENCOUNTER — Other Ambulatory Visit: Payer: Self-pay | Admitting: Cardiology

## 2014-07-25 NOTE — Telephone Encounter (Signed)
Rx refill sent to patient pharmacy   

## 2014-08-29 ENCOUNTER — Encounter: Payer: Self-pay | Admitting: Cardiology

## 2014-09-22 ENCOUNTER — Other Ambulatory Visit (INDEPENDENT_AMBULATORY_CARE_PROVIDER_SITE_OTHER): Payer: Self-pay | Admitting: Internal Medicine

## 2014-09-26 ENCOUNTER — Telehealth (INDEPENDENT_AMBULATORY_CARE_PROVIDER_SITE_OTHER): Payer: Self-pay | Admitting: Internal Medicine

## 2014-09-26 MED ORDER — PANTOPRAZOLE SODIUM 40 MG PO TBEC: 40.0000 mg | DELAYED_RELEASE_TABLET | Freq: Every day | ORAL | Status: DC

## 2014-09-26 NOTE — Telephone Encounter (Signed)
Rx for Protonix sent to patient.

## 2014-10-01 ENCOUNTER — Other Ambulatory Visit (INDEPENDENT_AMBULATORY_CARE_PROVIDER_SITE_OTHER): Payer: Self-pay | Admitting: Internal Medicine

## 2014-10-01 ENCOUNTER — Telehealth (INDEPENDENT_AMBULATORY_CARE_PROVIDER_SITE_OTHER): Payer: Self-pay | Admitting: Internal Medicine

## 2014-10-01 MED ORDER — PANTOPRAZOLE SODIUM 40 MG PO TBEC: 40.0000 mg | DELAYED_RELEASE_TABLET | Freq: Every day | ORAL | Status: DC

## 2014-10-01 NOTE — Telephone Encounter (Signed)
Rx eprescribed 

## 2014-10-01 NOTE — Telephone Encounter (Signed)
Rx sent 

## 2014-10-22 ENCOUNTER — Telehealth: Payer: Self-pay | Admitting: Cardiology

## 2014-10-22 NOTE — Telephone Encounter (Signed)
Dc apixaban 2 days prior to procedure and resume 2 days after if ok with surgery [name]

## 2014-10-22 NOTE — Telephone Encounter (Signed)
Please call,pt scheduled for surgery on his arm on 10-31-14. He wants to know about stopping his Eliquist.

## 2014-10-22 NOTE — Telephone Encounter (Signed)
Spoke with pt, he is having surgery to help with the nerve damage in the arm and help the fingers by dr Truman Hayward at [facility]. Will forward for dr Stanford Breed review

## 2014-10-23 NOTE — Telephone Encounter (Signed)
Spoke with Andrew Compton, Aware of dr Jacalyn Lefevre recommendations.  This note will be faxed to dr li @ [phone].

## 2014-11-20 ENCOUNTER — Encounter (INDEPENDENT_AMBULATORY_CARE_PROVIDER_SITE_OTHER): Payer: Self-pay | Admitting: *Deleted

## 2014-12-21 ENCOUNTER — Other Ambulatory Visit: Payer: Self-pay | Admitting: Cardiology

## 2014-12-29 ENCOUNTER — Other Ambulatory Visit: Payer: Self-pay | Admitting: Cardiology

## 2015-01-03 ENCOUNTER — Encounter: Payer: Self-pay | Admitting: Cardiology

## 2015-02-17 ENCOUNTER — Ambulatory Visit (INDEPENDENT_AMBULATORY_CARE_PROVIDER_SITE_OTHER): Payer: Self-pay | Admitting: Internal Medicine

## 2015-02-24 ENCOUNTER — Ambulatory Visit (INDEPENDENT_AMBULATORY_CARE_PROVIDER_SITE_OTHER): Payer: Managed Care, Other (non HMO) | Admitting: Internal Medicine

## 2015-02-24 ENCOUNTER — Ambulatory Visit (INDEPENDENT_AMBULATORY_CARE_PROVIDER_SITE_OTHER): Payer: Managed Care, Other (non HMO) | Admitting: Cardiology

## 2015-02-24 ENCOUNTER — Encounter: Payer: Self-pay | Admitting: Cardiology

## 2015-02-24 ENCOUNTER — Encounter (INDEPENDENT_AMBULATORY_CARE_PROVIDER_SITE_OTHER): Payer: Self-pay | Admitting: Internal Medicine

## 2015-02-24 ENCOUNTER — Encounter: Payer: Self-pay | Admitting: *Deleted

## 2015-02-24 VITALS — BP 110/84 | HR 89 | Ht 72.0 in | Wt 214.4 lb

## 2015-02-24 VITALS — BP 110/84 | HR 72 | Temp 97.8°F | Ht 72.0 in | Wt 214.9 lb

## 2015-02-24 DIAGNOSIS — K2270 Barrett's esophagus without dysplasia: Secondary | ICD-10-CM | POA: Diagnosis not present

## 2015-02-24 DIAGNOSIS — K219 Gastro-esophageal reflux disease without esophagitis: Secondary | ICD-10-CM

## 2015-02-24 DIAGNOSIS — R0989 Other specified symptoms and signs involving the circulatory and respiratory systems: Secondary | ICD-10-CM | POA: Insufficient documentation

## 2015-02-24 DIAGNOSIS — I10 Essential (primary) hypertension: Secondary | ICD-10-CM | POA: Diagnosis not present

## 2015-02-24 DIAGNOSIS — I480 Paroxysmal atrial fibrillation: Secondary | ICD-10-CM | POA: Diagnosis not present

## 2015-02-24 NOTE — Assessment & Plan Note (Signed)
Blood pressure controlled. Continue present medications. 

## 2015-02-24 NOTE — Patient Instructions (Addendum)
OV in one year. Continue present medications

## 2015-02-24 NOTE — Progress Notes (Signed)
      HPI: Fu atrial fibrillation. 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 He then developed sepsis. During the admission he was found to have recurrent paroxysmal atrial fibrillation. Echocardiogram in October of 2014 showed normal LV function. There was mild left atrial enlargement, mild right ventricular enlargement and mild aortic insufficiency. TSH 2.098. Patient was treated with Cardizem and metoprolol for rate control. He was placed on apixaban. Monitor 3/15 showed sinus with pacs and PVCs. Since last seen, the patient denies any dyspnea on exertion, orthopnea, PND, pedal edema, palpitations, syncope or chest pain.   Current Outpatient Prescriptions  Medication Sig Dispense Refill  . Ascorbic Acid (VITAMIN C) 1000 MG tablet Take 1,000 mg by mouth daily.    Marland Kitchen diltiazem (CARDIZEM CD) 360 MG 24 hr capsule Take 1 capsule (360 mg total) by mouth daily. 90 capsule 4  . ELIQUIS 5 MG TABS tablet TAKE 1 TABLET TWICE A DAY 180 tablet 1  . fluticasone (FLONASE) 50 MCG/ACT nasal spray Place 2 sprays into the nose as needed for rhinitis or allergies.     . furosemide (LASIX) 20 MG tablet TAKE 1 TABLET DAILY 90 tablet 0  . levothyroxine (SYNTHROID, LEVOTHROID) 50 MCG tablet Take 50 mcg by mouth daily.    Marland Kitchen lisinopril (PRINIVIL,ZESTRIL) 20 MG tablet TAKE 1 TABLET DAILY 90 tablet 3  . pantoprazole (PROTONIX) 40 MG tablet Take 1 tablet (40 mg total) by mouth daily. 30 tablet 3  . potassium chloride SA (K-DUR,KLOR-CON) 20 MEQ tablet TAKE 1 TABLET DAILY 90 tablet 2  . triamcinolone cream (KENALOG) 0.1 % Apply 1 application topically as needed (dry legs).      No current facility-administered medications for this visit.     Past Medical History  Diagnosis Date  . Essential hypertension, benign   . Hypothyroidism   . Other and unspecified hyperlipidemia   . Gouty arthropathy, unspecified   . GERD (gastroesophageal reflux disease)   . Paroxysmal  atrial fibrillation     Past Surgical History  Procedure Laterality Date  . No past surgeries    . Neurolysis median nerve Right 11/27/2013    Neurolysis Brachial Plexus    History   Social History  . Marital Status: Married    Spouse Name: N/A  . Number of Children: N/A  . Years of Education: N/A   Occupational History  .      [employer]   Social History Main Topics  . Smoking status: Former Smoker    Quit date: 08/02/1996  . Smokeless tobacco: Never Used  . Alcohol Use: Yes     Comment: Occasional  . Drug Use: No  . Sexual Activity: Not on file   Other Topics Concern  . Not on file   Social History Narrative   Has no children    ROS: no fevers or chills, productive cough, hemoptysis, dysphasia, odynophagia, melena, hematochezia, dysuria, hematuria, rash, seizure activity, orthopnea, PND, pedal edema, claudication. Remaining systems are negative.  Physical Exam: Well-developed well-nourished in no acute distress.  Skin is warm and dry.  HEENT is normal.  Neck is supple.  Chest is clear to auscultation with normal expansion.  Cardiovascular exam is regular rate and rhythm.  Abdominal exam nontender or distended. No masses palpated. Positive bruit Extremities show no edema. neuro grossly intact  ECG sinus rhythm at a rate of 89. No ST changes. Left atrial enlargement.

## 2015-02-24 NOTE — Progress Notes (Signed)
Subjective:    Patient ID: Andrew Compton, male    DOB: 13-Feb-1951, 64 y.o.   MRN: [id number]  HPI Here today for f/u of his chronic GERD complicated by Barrett's. He was last seen by Dr. Laural Golden in July of 2015.  He tells me he is doing. He says he has no complaints. There is no dysphagia. He is eating anything he wants. His appetitie is good. No weight loss. He has gained 10 pounds.No melena or BRRB. No abdominal pain. Acid reflux controlled with Protonix.  Hx of atrial fib and maintained on Eliquis   10/22/2010 Esophagogastroduodenoscopy with esophageal dilation.  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 The stricture was dilated to 16.5 mm with a balloon. The patient also had Barrett's which was not biopsied  because of ongoing inflammation. He states his heartburn has this gone with double-dose PPI and antireflux measures, but he is still having intermittent solid food dysphagia, but not like he was having prior to his first EGD. Procedures were reviewed with the patient. Informed consent was obtained. FINAL DIAGNOSIS: A 5-cm long Barrett epithelium. Biopsy taken for histologic confirmation.  Esophageal stricture 1 cm proximal to GE junction involving the Barrett segment associated with circumferential superficial ulceration. The stricture was dilated to 18 mm with a balloon.  A 3-cm size sliding hiatal hernia. Biopsy: Esophagus, biopsy, distal Barrett's. Benign GE junction mucosa with mild chronic inflammation. No intestinal metaplasia, H. Pylori, dysplasia or malignancy.  Biopsy of proximal Barrett's. Intestinal metaplasia (goblett metaplasia) consistent with Barrett's esophagus. No dysplasia or malignancy identified.    Review of Systems Married. Two step children in good health.     Past Medical History  Diagnosis Date  .  Essential hypertension, benign   . Hypothyroidism   . Other and unspecified hyperlipidemia   . Gouty arthropathy, unspecified   . GERD (gastroesophageal reflux disease)   . Paroxysmal atrial fibrillation     Past Surgical History  Procedure Laterality Date  . No past surgeries    . Neurolysis median nerve Right 11/27/2013    Neurolysis Brachial Plexus    Allergies  Allergen Reactions  . Azithromycin Hives  . Penicillins Other (See Comments)    Reaction: unknown , allergy since child per patient report  . Levophed [Norepinephrine Bitartrate]   . Sulfa Antibiotics     Current Outpatient Prescriptions on File Prior to Visit  Medication Sig Dispense Refill  . Ascorbic Acid (VITAMIN C) 1000 MG tablet Take 1,000 mg by mouth daily.    Marland Kitchen diltiazem (CARDIZEM CD) 360 MG 24 hr capsule Take 1 capsule (360 mg total) by mouth daily. 90 capsule 4  . ELIQUIS 5 MG TABS tablet TAKE 1 TABLET TWICE A DAY 180 tablet 1  . fluticasone (FLONASE) 50 MCG/ACT nasal spray Place 2 sprays into the nose as needed for rhinitis or allergies.     . furosemide (LASIX) 20 MG tablet TAKE 1 TABLET DAILY 90 tablet 0  . levothyroxine (SYNTHROID, LEVOTHROID) 50 MCG tablet Take 50 mcg by mouth daily.    Marland Kitchen lisinopril (PRINIVIL,ZESTRIL) 20 MG tablet TAKE 1 TABLET DAILY 90 tablet 3  . pantoprazole (PROTONIX) 40 MG tablet Take 1 tablet (40 mg total) by mouth daily. 30 tablet 3  . potassium chloride SA (K-DUR,KLOR-CON) 20 MEQ tablet TAKE 1 TABLET DAILY 90 tablet 2  . triamcinolone cream (KENALOG) 0.1 % Apply 1 application topically  as needed (dry legs).      No current facility-administered medications on file prior to visit.      '  Objective:   Physical Exam Blood pressure 110/84, pulse 72, temperature 97.8 F (36.6 C), height 6' (1.829 m), weight 214 lb 14.4 oz (97.478 kg). Alert and oriented. Skin warm and dry. Oral mucosa is moist.   . Sclera anicteric, conjunctivae is pink. Thyroid not enlarged. No cervical  lymphadenopathy. Lungs clear. Heart regular rate and rhythm.  Abdomen is soft. Bowel sounds are positive. No hepatomegaly. No abdominal masses felt. No tenderness.  No edema to lower extremities.         Assessment & Plan:  GERD controlled at this time with Protonix. No dysphagia.  Barrett's: Will need surveillance in 2017. (I discussed with DR. Rehman).

## 2015-02-24 NOTE — Assessment & Plan Note (Signed)
Patient remains in sinus rhythm. Continue Cardizem. Continue apixaban.

## 2015-02-24 NOTE — Assessment & Plan Note (Signed)
Will arrange abdominal ultrasound for 6 months when he returns.

## 2015-02-24 NOTE — Patient Instructions (Signed)
Your physician wants you to follow-up in: [place] will receive a reminder letter in the mail two months in advance. If you don't receive a letter, please call our office to schedule the follow-up appointment.   Your physician has requested that you have an abdominal aorta duplex. During this test, an ultrasound is used to evaluate the aorta. Allow 30 minutes for this exam. Do not eat after midnight the day before and avoid carbonated beverages  IF CAN NOT BE DONE TODAY SCHEDULE SAME DAY AS FOLLOW UP IN 6 MONTHS

## 2015-02-25 ENCOUNTER — Telehealth (HOSPITAL_COMMUNITY): Payer: Self-pay | Admitting: *Deleted

## 2015-03-22 ENCOUNTER — Other Ambulatory Visit: Payer: Self-pay | Admitting: Cardiology

## 2015-03-27 ENCOUNTER — Other Ambulatory Visit: Payer: Self-pay | Admitting: Cardiology

## 2015-04-02 ENCOUNTER — Other Ambulatory Visit: Payer: Self-pay | Admitting: Cardiology

## 2015-04-03 ENCOUNTER — Telehealth: Payer: Self-pay | Admitting: *Deleted

## 2015-04-03 NOTE — Telephone Encounter (Signed)
Prior authorization complete and approved through 04-02-16 for eliquis

## 2015-06-29 ENCOUNTER — Other Ambulatory Visit: Payer: Self-pay | Admitting: Cardiology

## 2015-07-09 ENCOUNTER — Telehealth: Payer: Self-pay | Admitting: Cardiology

## 2015-07-09 NOTE — Telephone Encounter (Signed)
Needs medical clearance for a bridge.Please fax [phone].

## 2015-07-10 ENCOUNTER — Other Ambulatory Visit: Payer: Self-pay | Admitting: Cardiology

## 2015-07-10 NOTE — Telephone Encounter (Signed)
Will fax this note to the number provided. 

## 2015-07-10 NOTE — Telephone Encounter (Signed)
Ok to hold apixaban 2 days prior to procedure and resume after when ok with surgeon [name]

## 2015-07-10 NOTE — Telephone Encounter (Signed)
Will forward for dr Stanford Breed review for directions regarding Eliquis

## 2015-07-10 NOTE — Telephone Encounter (Signed)
Rx(s) sent to pharmacy electronically.  

## 2015-09-09 NOTE — Progress Notes (Signed)
HPI: Fu atrial fibrillation. 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 He then developed sepsis. During the admission he was found to have recurrent paroxysmal atrial fibrillation. Echocardiogram in October of 2014 showed normal LV function. There was mild left atrial enlargement, mild right ventricular enlargement and mild aortic insufficiency. TSH 2.098. Patient was treated with Cardizem and metoprolol for rate control. He was placed on apixaban. Monitor 3/15 showed sinus with pacs and PVCs. Since last seen, the patient denies any dyspnea on exertion, orthopnea, PND, pedal edema, palpitations, syncope or chest pain.   Current Outpatient Prescriptions  Medication Sig Dispense Refill  . Ascorbic Acid (VITAMIN C) 1000 MG tablet Take 1,000 mg by mouth daily.    Marland Kitchen diltiazem (CARDIZEM CD) 360 MG 24 hr capsule Take 1 capsule (360 mg total) by mouth daily. 90 capsule 4  . ELIQUIS 5 MG TABS tablet TAKE 1 TABLET TWICE A DAY 180 tablet 0  . fluticasone (FLONASE) 50 MCG/ACT nasal spray Place 2 sprays into the nose as needed for rhinitis or allergies.     . furosemide (LASIX) 20 MG tablet Take 1 tablet (20 mg total) by mouth daily. 90 tablet 1  . levothyroxine (SYNTHROID, LEVOTHROID) 50 MCG tablet Take 50 mcg by mouth daily.    Marland Kitchen lisinopril (PRINIVIL,ZESTRIL) 20 MG tablet TAKE 1 TABLET DAILY 90 tablet 3  . pantoprazole (PROTONIX) 40 MG tablet Take 1 tablet (40 mg total) by mouth daily. 30 tablet 3  . potassium chloride SA (K-DUR,KLOR-CON) 20 MEQ tablet TAKE 1 TABLET DAILY 90 tablet 2  . triamcinolone cream (KENALOG) 0.1 % Apply 1 application topically as needed (dry legs).      No current facility-administered medications for this visit.     Past Medical History  Diagnosis Date  . Essential hypertension, benign   . Hypothyroidism   . Other and unspecified hyperlipidemia   . Gouty arthropathy, unspecified   . GERD (gastroesophageal reflux  disease)   . Paroxysmal atrial fibrillation [facility])     Past Surgical History  Procedure Laterality Date  . No past surgeries    . Neurolysis median nerve Right 11/27/2013    Neurolysis Brachial Plexus    Social History   Social History  . Marital Status: Married    Spouse Name: N/A  . Number of Children: N/A  . Years of Education: N/A   Occupational History  .      [employer]   Social History Main Topics  . Smoking status: Former Smoker    Quit date: 08/02/1996  . Smokeless tobacco: Never Used  . Alcohol Use: Yes     Comment: Occasional  . Drug Use: No  . Sexual Activity: Not on file   Other Topics Concern  . Not on file   Social History Narrative   Has no children    Family History  Problem Relation Age of Onset  . Breast cancer Mother   . Other Father     unknown death cause    ROS: no fevers or chills, productive cough, hemoptysis, dysphasia, odynophagia, melena, hematochezia, dysuria, hematuria, rash, seizure activity, orthopnea, PND, pedal edema, claudication. Remaining systems are negative.  Physical Exam: Well-developed well-nourished in no acute distress.  Skin is warm and dry.  HEENT is normal.  Neck is supple.  Chest is clear to auscultation with normal expansion.  Cardiovascular exam is regular rate and rhythm.  Abdominal exam nontender or distended. No masses palpated.  Extremities show no edema. neuro grossly intact  ECG Sinus rhythm, no ST changes.

## 2015-09-10 ENCOUNTER — Telehealth: Payer: Self-pay | Admitting: Cardiology

## 2015-09-10 NOTE — Telephone Encounter (Signed)
New message     What dental office are you calling from? Dr Muelhick What is your office phone and fax number? Fax [phone] What type of procedure is the patient having performed? extractions 1. What date is procedure scheduled? 10-01-15 2. What is your question (ex. Antibiotics prior to procedure, holding medication-we need to know how long dentist wants pt to hold med)?  Hold eliquis prior to extractions?  OK to give pt note when he comes in Monday or you may fax the note to the dentist

## 2015-09-10 NOTE — Telephone Encounter (Signed)
Pt to have 3 anterior teeth removed on 3/1 - Cathy states these are freely movable so they may fall out on their own.  Pt just needs written instruction/permission for holding Eliquis in advance of that appt.  Pt has scheduled OV w/ Dr. Stanford Breed on 2/13. Can address then.

## 2015-09-15 ENCOUNTER — Encounter: Payer: Self-pay | Admitting: Cardiology

## 2015-09-15 ENCOUNTER — Ambulatory Visit (INDEPENDENT_AMBULATORY_CARE_PROVIDER_SITE_OTHER): Payer: Managed Care, Other (non HMO) | Admitting: Cardiology

## 2015-09-15 VITALS — BP 118/80 | HR 65 | Ht 72.0 in | Wt 223.0 lb

## 2015-09-15 DIAGNOSIS — I10 Essential (primary) hypertension: Secondary | ICD-10-CM

## 2015-09-15 DIAGNOSIS — I4891 Unspecified atrial fibrillation: Secondary | ICD-10-CM

## 2015-09-15 DIAGNOSIS — R0989 Other specified symptoms and signs involving the circulatory and respiratory systems: Secondary | ICD-10-CM

## 2015-09-15 NOTE — Assessment & Plan Note (Signed)
Patient remains in sinus rhythm. Continue Cardizem for rate control if atrial fibrillation recurs. Continue apixaban. Check hemoglobin and renal function.

## 2015-09-15 NOTE — Assessment & Plan Note (Signed)
Blood pressure controlled. Continue present medications. 

## 2015-09-15 NOTE — Assessment & Plan Note (Signed)
Scheduled abdominal ultrasound to exclude aneurysm. 

## 2015-09-15 NOTE — Patient Instructions (Signed)
Medication Instructions:   No CHANGE  Labwork:  Your physician recommends that you return for lab work WHEN ABLE  Testing/Procedures:  Your physician has requested that you have an abdominal aorta duplex. During this test, an ultrasound is used to evaluate the aorta. Allow 30 minutes for this exam. Do not eat after midnight the day before and avoid carbonated beverages   Follow-Up:  Your physician wants you to follow-up in: [place] will receive a reminder letter in the mail two months in advance. If you don't receive a letter, please call our office to schedule the follow-up appointment.   If you need a refill on your cardiac medications before your next appointment, please call your pharmacy.

## 2015-09-21 ENCOUNTER — Other Ambulatory Visit: Payer: Self-pay | Admitting: Cardiology

## 2015-09-22 NOTE — Telephone Encounter (Signed)
Rx(s) sent to pharmacy electronically.  

## 2015-09-25 ENCOUNTER — Ambulatory Visit (HOSPITAL_COMMUNITY)
Admission: RE | Admit: 2015-09-25 | Discharge: 2015-09-25 | Disposition: A | Discharge status: Home / Self Care | Payer: Managed Care, Other (non HMO) | Source: Ambulatory Visit | Attending: Cardiology | Admitting: Cardiology

## 2015-09-25 ENCOUNTER — Other Ambulatory Visit: Payer: Self-pay | Admitting: Cardiology

## 2015-09-25 DIAGNOSIS — E785 Hyperlipidemia, unspecified: Secondary | ICD-10-CM | POA: Insufficient documentation

## 2015-09-25 DIAGNOSIS — I700 Atherosclerosis of aorta: Secondary | ICD-10-CM | POA: Diagnosis not present

## 2015-09-25 DIAGNOSIS — R0989 Other specified symptoms and signs involving the circulatory and respiratory systems: Secondary | ICD-10-CM

## 2015-09-25 DIAGNOSIS — I10 Essential (primary) hypertension: Secondary | ICD-10-CM | POA: Insufficient documentation

## 2015-09-25 DIAGNOSIS — I723 Aneurysm of iliac artery: Secondary | ICD-10-CM

## 2015-09-25 LAB — CBC
HEMATOCRIT: 37.8 % — AB (ref 39.0–52.0)
HEMOGLOBIN: 12.3 g/dL — AB (ref 13.0–17.0)
MCH: 27.6 pg (ref 26.0–34.0)
MCHC: 32.5 g/dL (ref 30.0–36.0)
MCV: 84.8 fL (ref 78.0–100.0)
MPV: 9.6 fL (ref 8.6–12.4)
Platelets: 225 10*3/uL (ref 150–400)
RBC: 4.46 MIL/uL (ref 4.22–5.81)
RDW: 14.5 % (ref 11.5–15.5)
WBC: 5.5 10*3/uL (ref 4.0–10.5)

## 2015-09-25 NOTE — Addendum Note (Signed)
Addended by: Diana Eves on: 09/25/2015 02:42 PM   Modules accepted: Orders

## 2015-09-26 ENCOUNTER — Other Ambulatory Visit: Payer: Self-pay | Admitting: Cardiology

## 2015-09-26 LAB — BASIC METABOLIC PANEL
BUN: 14 mg/dL (ref 7–25)
CO2: 30 mmol/L (ref 20–31)
Calcium: 9.5 mg/dL (ref 8.6–10.3)
Chloride: 94 mmol/L — ABNORMAL LOW (ref 98–110)
Creat: 0.97 mg/dL (ref 0.70–1.25)
GLUCOSE: 105 mg/dL — AB (ref 65–99)
POTASSIUM: 4.5 mmol/L (ref 3.5–5.3)
Sodium: 132 mmol/L — ABNORMAL LOW (ref 135–146)

## 2015-10-16 ENCOUNTER — Encounter (INDEPENDENT_AMBULATORY_CARE_PROVIDER_SITE_OTHER): Payer: Self-pay | Admitting: *Deleted

## 2015-10-27 ENCOUNTER — Other Ambulatory Visit (INDEPENDENT_AMBULATORY_CARE_PROVIDER_SITE_OTHER): Payer: Self-pay | Admitting: *Deleted

## 2015-10-27 DIAGNOSIS — K2270 Barrett's esophagus without dysplasia: Secondary | ICD-10-CM

## 2015-12-03 ENCOUNTER — Encounter (INDEPENDENT_AMBULATORY_CARE_PROVIDER_SITE_OTHER): Payer: Self-pay | Admitting: *Deleted

## 2015-12-03 ENCOUNTER — Telehealth: Payer: Self-pay | Admitting: Cardiology

## 2015-12-03 NOTE — Telephone Encounter (Signed)
This encounter was created in error - please disregard.

## 2015-12-03 NOTE — Telephone Encounter (Signed)
DC apixaban 2 days prior to procedure and resume day after. Andrew Compton  

## 2015-12-03 NOTE — Telephone Encounter (Signed)
Request for surgical clearance:  1. What type of surgery is being performed?Endoscopy   2. When is this surgery scheduled? 01-15-16  3. Are there any medications that need to be held prior to surgery and how long?Can he stop his Eliquis 2 days prior to procedure?  4. Name of physician performing surgery? Dr Lucienne Minks  5. What is your office phone and fax number? [phone] and Fax number is [phone] or send in Epic  6.

## 2015-12-03 NOTE — Telephone Encounter (Signed)
Will forward for dr crenshaw review  

## 2015-12-04 NOTE — Telephone Encounter (Signed)
Cardiology recommendations noted. 

## 2015-12-19 ENCOUNTER — Telehealth (INDEPENDENT_AMBULATORY_CARE_PROVIDER_SITE_OTHER): Payer: Self-pay | Admitting: *Deleted

## 2015-12-19 NOTE — Telephone Encounter (Signed)
Referring MD/PCP: sasser   Procedure: egd  Reason/Indication:  Barrett's esophagus  Has patient had this procedure before?  Yes, 2012 -- epic  If so, when, by whom and where?    Is there a family history of colon cancer?    Who?  What age when diagnosed?    Is patient diabetic?         Does patient have prosthetic heart valve or mechanical valve?  no  Do you have a pacemaker?  no  Has patient ever had endocarditis? no  Has patient had joint replacement within last 12 months?  no  Does patient tend to be constipated or take laxatives? no  Does patient have a history of alcohol/drug use?  no  Is patient on Coumadin, Plavix and/or Aspirin? yes  Medications: see epic  Allergies: see epic  Medication Adjustment: eliquis 2 days   Procedure date & time: 01/15/16 at 58

## 2015-12-20 ENCOUNTER — Other Ambulatory Visit: Payer: Self-pay | Admitting: Cardiology

## 2015-12-22 NOTE — Telephone Encounter (Signed)
Rx(s) sent to pharmacy electronically.  

## 2015-12-23 NOTE — Telephone Encounter (Signed)
agree

## 2015-12-26 ENCOUNTER — Other Ambulatory Visit: Payer: Self-pay | Admitting: Cardiology

## 2015-12-26 NOTE — Telephone Encounter (Signed)
REFILL 

## 2016-01-15 ENCOUNTER — Encounter (HOSPITAL_COMMUNITY)
Admission: RE | Disposition: A | Discharge status: Home / Self Care | Payer: Self-pay | Source: Ambulatory Visit | Attending: Internal Medicine

## 2016-01-15 ENCOUNTER — Ambulatory Visit (HOSPITAL_COMMUNITY)
Admission: RE | Admit: 2016-01-15 | Discharge: 2016-01-15 | Disposition: A | Discharge status: Home / Self Care | Payer: Managed Care, Other (non HMO) | Source: Ambulatory Visit | Attending: Internal Medicine | Admitting: Internal Medicine

## 2016-01-15 ENCOUNTER — Encounter (HOSPITAL_COMMUNITY): Payer: Self-pay | Admitting: *Deleted

## 2016-01-15 DIAGNOSIS — K449 Diaphragmatic hernia without obstruction or gangrene: Secondary | ICD-10-CM

## 2016-01-15 DIAGNOSIS — Z803 Family history of malignant neoplasm of breast: Secondary | ICD-10-CM | POA: Insufficient documentation

## 2016-01-15 DIAGNOSIS — I480 Paroxysmal atrial fibrillation: Secondary | ICD-10-CM | POA: Insufficient documentation

## 2016-01-15 DIAGNOSIS — E785 Hyperlipidemia, unspecified: Secondary | ICD-10-CM | POA: Insufficient documentation

## 2016-01-15 DIAGNOSIS — R1314 Dysphagia, pharyngoesophageal phase: Secondary | ICD-10-CM

## 2016-01-15 DIAGNOSIS — I10 Essential (primary) hypertension: Secondary | ICD-10-CM | POA: Insufficient documentation

## 2016-01-15 DIAGNOSIS — E039 Hypothyroidism, unspecified: Secondary | ICD-10-CM | POA: Insufficient documentation

## 2016-01-15 DIAGNOSIS — Z79899 Other long term (current) drug therapy: Secondary | ICD-10-CM | POA: Diagnosis not present

## 2016-01-15 DIAGNOSIS — K219 Gastro-esophageal reflux disease without esophagitis: Secondary | ICD-10-CM | POA: Diagnosis not present

## 2016-01-15 DIAGNOSIS — K31819 Angiodysplasia of stomach and duodenum without bleeding: Secondary | ICD-10-CM | POA: Insufficient documentation

## 2016-01-15 DIAGNOSIS — K222 Esophageal obstruction: Secondary | ICD-10-CM | POA: Insufficient documentation

## 2016-01-15 DIAGNOSIS — Z87891 Personal history of nicotine dependence: Secondary | ICD-10-CM | POA: Diagnosis not present

## 2016-01-15 DIAGNOSIS — K2270 Barrett's esophagus without dysplasia: Secondary | ICD-10-CM

## 2016-01-15 DIAGNOSIS — Z048 Encounter for examination and observation for other specified reasons: Secondary | ICD-10-CM | POA: Diagnosis not present

## 2016-01-15 DIAGNOSIS — K228 Other specified diseases of esophagus: Secondary | ICD-10-CM

## 2016-01-15 DIAGNOSIS — Z7901 Long term (current) use of anticoagulants: Secondary | ICD-10-CM | POA: Insufficient documentation

## 2016-01-15 HISTORY — PX: ESOPHAGEAL DILATION: SHX303

## 2016-01-15 HISTORY — PX: ESOPHAGOGASTRODUODENOSCOPY: SHX5428

## 2016-01-15 SURGERY — EGD (ESOPHAGOGASTRODUODENOSCOPY)
Anesthesia: Moderate Sedation

## 2016-01-15 MED ORDER — MIDAZOLAM HCL 5 MG/5ML IJ SOLN
INTRAMUSCULAR | Status: AC
  Filled 2016-01-15: qty 10

## 2016-01-15 MED ORDER — SODIUM CHLORIDE 0.9 % IV SOLN
INTRAVENOUS | Status: DC
  Administered 2016-01-15: 1000 mL via INTRAVENOUS

## 2016-01-15 MED ORDER — PANTOPRAZOLE SODIUM 40 MG PO TBEC: 40.0000 mg | DELAYED_RELEASE_TABLET | Freq: Two times a day (BID) | ORAL | Status: DC

## 2016-01-15 MED ORDER — MEPERIDINE HCL 50 MG/ML IJ SOLN
INTRAMUSCULAR | Status: DC | PRN
  Administered 2016-01-15 (×2): 25 mg via INTRAVENOUS

## 2016-01-15 MED ORDER — MEPERIDINE HCL 50 MG/ML IJ SOLN
INTRAMUSCULAR | Status: AC
  Filled 2016-01-15: qty 1

## 2016-01-15 MED ORDER — MIDAZOLAM HCL 5 MG/5ML IJ SOLN
INTRAMUSCULAR | Status: DC | PRN
  Administered 2016-01-15 (×4): 2 mg via INTRAVENOUS

## 2016-01-15 MED ORDER — STERILE WATER FOR IRRIGATION IR SOLN
Status: DC | PRN
  Administered 2016-01-15: 2.5 mL

## 2016-01-15 MED ORDER — BUTAMBEN-TETRACAINE-BENZOCAINE 2-2-14 % EX AERO
INHALATION_SPRAY | CUTANEOUS | Status: DC | PRN
  Administered 2016-01-15: 2 via TOPICAL

## 2016-01-15 NOTE — Discharge Instructions (Signed)
Increase pantoprazole to 40 mg by mouth 30 minutes before breakfast and evening meal daily. Resume Eliquis on 01/16/2016 and other medications as before. Resume usual diet. No driving for 24 hours. Physician will call with biopsy results. Office visit in 6 months.       Esophagogastroduodenoscopy, Care After Refer to this sheet in the next few weeks. These instructions provide you with information about caring for yourself after your procedure. Your health care provider may also give you more specific instructions. Your treatment has been planned according to current medical practices, but problems sometimes occur. Call your health care provider if you have any problems or questions after your procedure. WHAT TO EXPECT AFTER THE PROCEDURE After your procedure, it is typical to feel:  Soreness in your throat.  Pain with swallowing.  Sick to your stomach (nauseous).  Bloated.  Dizzy.  Fatigued. HOME CARE INSTRUCTIONS  Do not eat or drink anything until the numbing medicine (local anesthetic) has worn off and your gag reflex has returned. You will know that the local anesthetic has worn off when you can swallow comfortably.  Do not drive or operate machinery until directed by your health care provider.  Take medicines only as directed by your health care provider. SEEK MEDICAL CARE IF:   You cannot stop coughing.  You are not urinating at all or less than usual. SEEK IMMEDIATE MEDICAL CARE IF:  You have difficulty swallowing.  You cannot eat or drink.  You have worsening throat or chest pain.  You have dizziness or lightheadedness or you faint.  You have nausea or vomiting.  You have chills.  You have a fever.  You have severe abdominal pain.  You have black, tarry, or bloody stools.   This information is not intended to replace advice given to you by your health care provider. Make sure you discuss any questions you have with your health care provider.     Document Released: 07/05/2012 Document Revised: 08/09/2014 Document Reviewed: 07/05/2012 Elsevier Interactive Patient Education [employer].

## 2016-01-15 NOTE — H&P (Signed)
Andrew Compton is an 65 y.o. male.   Chief Complaint: Patient is here for EGD. HPI: She is 65 year old Caucasian male who was chronic GERD complicated by long segment Barrett's esophagus who is here for surveillance EGD. He also has history of esophageal stricture. This stricture was dilated back in March 2012. Biopsies are negative for dysplasia. He says heartburns well controlled with therapy. He may have occasional slight difficulty. He's not had food impaction. He does not smoke cigarettes and drinks alcohol socially. He does not drink alcohol daily or every week.  Past Medical History  Diagnosis Date  . Essential hypertension, benign   . Hypothyroidism   . Other and unspecified hyperlipidemia   . Gouty arthropathy, unspecified   . GERD (gastroesophageal reflux disease)   . Paroxysmal atrial fibrillation [facility])     Past Surgical History  Procedure Laterality Date  . No past surgeries    . Neurolysis median nerve Right 11/27/2013    Neurolysis Brachial Plexus  . Fracture surgery Left     wrist, multiple surgeries  . Shoulder surgery Right     Family History  Problem Relation Age of Onset  . Breast cancer Mother   . Other Father     unknown death cause   Social History:  reports that he quit smoking about 19 years ago. He has never used smokeless tobacco. He reports that he drinks alcohol. He reports that he does not use illicit drugs.  Allergies:  Allergies  Allergen Reactions  . Azithromycin Hives  . Penicillins Other (See Comments)    Reaction: unknown , allergy since child per patient report  . Levophed [Norepinephrine Bitartrate]   . Sulfa Antibiotics     Medications Prior to Admission  Medication Sig Dispense Refill  . diltiazem (CARDIZEM CD) 360 MG 24 hr capsule Take 1 capsule (360 mg total) by mouth daily. 90 capsule 4  . ELIQUIS 5 MG TABS tablet TAKE 1 TABLET TWICE A DAY 180 tablet 1  . fluticasone (FLONASE) 50 MCG/ACT nasal spray Place 2 sprays into the  nose as needed for rhinitis or allergies.     . hydrochlorothiazide (HYDRODIURIL) 25 MG tablet Take 25 mg by mouth daily.    Marland Kitchen levothyroxine (SYNTHROID, LEVOTHROID) 50 MCG tablet Take 50 mcg by mouth daily.    Marland Kitchen lisinopril (PRINIVIL,ZESTRIL) 20 MG tablet TAKE 1 TABLET DAILY 90 tablet 3  . pantoprazole (PROTONIX) 40 MG tablet Take 1 tablet (40 mg total) by mouth daily. 30 tablet 3  . potassium chloride SA (K-DUR,KLOR-CON) 20 MEQ tablet TAKE 1 TABLET DAILY 90 tablet 3  . Ascorbic Acid (VITAMIN C) 1000 MG tablet Take 1,000 mg by mouth daily.    Marland Kitchen triamcinolone cream (KENALOG) 0.1 % Apply 1 application topically as needed (dry legs).       No results found for this or any previous visit (from the past 48 hour(s)). No results found.  ROS  Blood pressure 111/74, pulse 84, temperature 97.7 F (36.5 C), temperature source Oral, resp. rate 10, height 6' (1.829 m), weight 210 lb (95.255 kg), SpO2 97 %. Physical Exam  Constitutional: He appears well-developed and well-nourished.  HENT:  Mouth/Throat: Oropharynx is clear and moist.  Eyes: Conjunctivae are normal. No scleral icterus.  Neck: No thyromegaly present.  Cardiovascular: Normal rate, regular rhythm and normal heart sounds.   No murmur heard. Respiratory: Effort normal and breath sounds normal.  GI: Soft. He exhibits no distension and no mass. There is no tenderness.  Musculoskeletal: He  exhibits no edema.  Lymphadenopathy:    He has no cervical adenopathy.  Neurological: He is alert.  Skin: Skin is warm and dry.     Assessment/Plan Barrett's esophagus. Surveillance EGD.  Hildred Laser, MD 01/15/2016, 10:34 AM

## 2016-01-15 NOTE — Op Note (Signed)
[facility] Patient Name: Andrew Compton Procedure Date: 01/15/2016 10:30 AM MRN: [id number] Date of Birth: [birth year] Attending MD: Hildred Laser , MD CSN: [id number] Age: 65 Admit Type: Outpatient Procedure:                Upper GI endoscopy Indications:              Follow-up of Barrett's esophagus. Esophageal                            dysphagia, Providers:                Hildred Laser, MD, Lurline Del, RN, Georgeann Oppenheim,                            Technician Referring MD:             Manon Hilding Medicines:                Cetacaine spray, Meperidine 50 mg IV, Midazolam 8                            mg IV Complications:            No immediate complications. Estimated Blood Loss:     Estimated blood loss was minimal. Procedure:                Pre-Anesthesia Assessment:                           - Prior to the procedure, a History and Physical                            was performed, and patient medications and                            allergies were reviewed. The patient's tolerance of                            previous anesthesia was also reviewed. The risks                            and benefits of the procedure and the sedation                            options and risks were discussed with the patient.                            All questions were answered, and informed consent                            was obtained. Prior Anticoagulants: The patient                            last took Eliquis (apixaban) 2 days prior to the  procedure. ASA Grade Assessment: II - A patient                            with mild systemic disease. After reviewing the                            risks and benefits, the patient was deemed in                            satisfactory condition to undergo the procedure.                           After obtaining informed consent, the endoscope was                            passed under direct vision. Throughout  the                            procedure, the patient's blood pressure, pulse, and                            oxygen saturations were monitored continuously. The                            [id number] scope was introduced through the                            mouth, and advanced to the second part of duodenum.                            The upper GI endoscopy was accomplished without                            difficulty. The patient tolerated the procedure                            well. Scope In: 10:45:47 AM Scope Out: 11:04:29 AM Total Procedure Duration: 0 hours 18 minutes 42 seconds  Findings:      The upper third of the esophagus and middle third of the esophagus were       normal.      There were esophageal mucosal changes secondary to established       long-segment Barrett's disease present in the lower third of the       esophagus. The maximum longitudinal extent of these mucosal changes was       5 cm in length. Mucosa was biopsied with a cold forceps for histology in       4 quadrants in the lower third of the esophagus. A total of 2 specimen       bottles were sent to pathology.      A single 4 mm mucosal nodule was found in the lower third of the       esophagus, 38 cm from the incisors. Biopsies were taken with a cold       forceps for histology.  One mild benign-appearing, intrinsic stenosis was found 38 cm from the       incisors. This measured 1.1 cm (inner diameter) x less than one cm (in       length) and was traversed. A TTS dilator was passed through the scope.       Dilation with a 15-16.5-18 mm balloon dilator was performed to 15 mm,       16.5 mm and 18 mm. The dilation site was examined and showed moderate       improvement in luminal narrowing.      A 3 cm hiatal hernia was present.      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      The exam of the stomach was otherwise normal.      The  duodenal bulb and second portion of the duodenum were normal. Impression:               - Normal upper third of esophagus and middle third                            of esophagus.                           - Esophageal mucosal changes secondary to                            established long-segment Barrett's disease.                            Biopsied.                           - Mucosal nodule found in the esophagus. Biopsied.                           - Benign-appearing esophageal stenosis. Dilated.                           - 3 cm hiatal hernia.                           - Gastric antral vascular ectasia without bleeding.                           - Normal duodenal bulb and second portion of the                            duodenum. Moderate Sedation:      Moderate (conscious) sedation was administered by the endoscopy nurse       and supervised by the endoscopist. The following parameters were       monitored: oxygen saturation, heart rate, blood pressure, CO2       capnography and response to care. Total physician intraservice time was       28 minutes. Recommendation:           - Patient has a contact number available for  emergencies. The signs and symptoms of potential                            delayed complications were discussed with the                            patient. Return to normal activities tomorrow.                            Written discharge instructions were provided to the                            patient.                           - Resume previous diet today.                           - Continue present medications.                           - Resume Eliquis (apixaban) at prior dose tomorrow.                           - Increase Protonix (pantoprazole) to 40 mg PO BID                            PRN.                           - Await pathology results.                           - Return to GI office in 6 months. Procedure Code(s):         --- Professional ---                           [phone], Esophagogastroduodenoscopy, flexible,                            transoral; with transendoscopic balloon dilation of                            esophagus (less than 30 mm diameter)                           43239, Esophagogastroduodenoscopy, flexible,                            transoral; with biopsy, single or multiple                           99152, Moderate sedation services provided by the                            same physician or other qualified health care  professional performing the diagnostic or                            therapeutic service that the sedation supports,                            requiring the presence of an independent trained                            observer to assist in the monitoring of the                            patient's level of consciousness and physiological                            status; initial 15 minutes of intraservice time,                            patient age 13 years or older                           [phone], Moderate sedation services; each additional                            15 minutes intraservice time Diagnosis Code(s):        --- Professional ---                           K22.70, Barrett's esophagus without dysplasia                           K22.8, Other specified diseases of esophagus                           K22.2, Esophageal obstruction                           K44.9, Diaphragmatic hernia without obstruction or                            gangrene                           K31.819, Angiodysplasia of stomach and duodenum                            without bleeding                           R13.14, Dysphagia, pharyngoesophageal phase CPT copyright 2016 American Medical Association. All rights reserved. The codes documented in this report are preliminary and upon coder review may  be revised to meet current compliance requirements. Hildred Laser, MD Hildred Laser, MD 01/15/2016 11:22:18 AM This report has been signed electronically. Number of Addenda: 0

## 2016-01-19 ENCOUNTER — Encounter (HOSPITAL_COMMUNITY): Payer: Self-pay | Admitting: Internal Medicine

## 2016-01-27 ENCOUNTER — Other Ambulatory Visit (INDEPENDENT_AMBULATORY_CARE_PROVIDER_SITE_OTHER): Payer: Self-pay | Admitting: *Deleted

## 2016-01-27 ENCOUNTER — Encounter (INDEPENDENT_AMBULATORY_CARE_PROVIDER_SITE_OTHER): Payer: Self-pay | Admitting: Internal Medicine

## 2016-01-27 ENCOUNTER — Encounter (INDEPENDENT_AMBULATORY_CARE_PROVIDER_SITE_OTHER): Payer: Self-pay | Admitting: *Deleted

## 2016-01-27 ENCOUNTER — Other Ambulatory Visit (INDEPENDENT_AMBULATORY_CARE_PROVIDER_SITE_OTHER): Payer: Self-pay | Admitting: Internal Medicine

## 2016-01-27 ENCOUNTER — Ambulatory Visit (INDEPENDENT_AMBULATORY_CARE_PROVIDER_SITE_OTHER): Payer: Managed Care, Other (non HMO) | Admitting: Internal Medicine

## 2016-01-27 VITALS — BP 110/70 | HR 60 | Temp 98.2°F | Ht 72.0 in | Wt 223.4 lb

## 2016-01-27 DIAGNOSIS — R195 Other fecal abnormalities: Secondary | ICD-10-CM

## 2016-01-27 DIAGNOSIS — R140 Abdominal distension (gaseous): Secondary | ICD-10-CM

## 2016-01-27 NOTE — Patient Instructions (Addendum)
The risks and benefits such as perforation, bleeding, and infection were reviewed with the patient and is agreeable. 

## 2016-01-27 NOTE — Progress Notes (Signed)
Subjective:    Patient ID: Andrew Compton, male    DOB: 12-20-1950, 65 y.o.   MRN: [id number]  HPI Presents today with c/o bowel problems. His bowels have always being consistent. Symptoms for a couple of weeks.  He says he feels bloated. He says he is having 6-7 small BMs a day. Stools are formed. He says he has not had a normal BM in 3 weeks. No diarrhea. He usually has 2-3 BMs a day. No melena or BRRB.  He has tried Magnesium Citrate which helps. He has started taking Benefiber which has helped. There is no pain.  He says he never bloated ever.  His appetite is good. He has gained weight.  He has tried a stool softener which did not help. He has tried Miralax which he did not see any difference.     Last colonoscopy in 2011 with 2 hyperplastic polyps.    01/15/2016: Upper GI endoscopy Indications: Follow-up of Barrett's esophagus. Esophageal   dysphagia Impression: - Normal upper third of esophagus and middle third   of esophagus.  - Esophageal mucosal changes secondary to   established long-segment Barrett's disease.   Biopsied.  - Mucosal nodule found in the esophagus. Biopsied.  - Benign-appearing esophageal stenosis. Dilated.  - 3 cm hiatal hernia.  Biopsies show Barrett's esophagus without dysplasia. Patient states he is able to swallow better and he is less hoarse.  Review of Systems Past Medical History  Diagnosis Date  . Essential hypertension, benign   . Hypothyroidism   . Other and unspecified hyperlipidemia   . Gouty arthropathy, unspecified   . GERD (gastroesophageal reflux disease)   . Paroxysmal atrial fibrillation [facility])     Past Surgical History  Procedure Laterality Date  . No past surgeries    . Neurolysis median nerve Right  11/27/2013    Neurolysis Brachial Plexus  . Fracture surgery Left     wrist, multiple surgeries  . Shoulder surgery Right   . Esophagogastroduodenoscopy N/A 01/15/2016    Procedure: ESOPHAGOGASTRODUODENOSCOPY (EGD);  Surgeon: Rogene Houston, MD;  Location: [facility];  Service: Endoscopy;  Laterality: N/A;  1030  . Esophageal dilation  01/15/2016    Procedure: ESOPHAGEAL DILATION;  Surgeon: Rogene Houston, MD;  Location: [facility];  Service: Endoscopy;;    Allergies  Allergen Reactions  . Azithromycin Hives  . Penicillins Other (See Comments)    Reaction: unknown , allergy since child per patient report  . Levophed [Norepinephrine Bitartrate]   . Sulfa Antibiotics     Current Outpatient Prescriptions on File Prior to Visit  Medication Sig Dispense Refill  . Ascorbic Acid (VITAMIN C) 1000 MG tablet Take 1,000 mg by mouth daily.    Marland Kitchen diltiazem (CARDIZEM CD) 360 MG 24 hr capsule Take 1 capsule (360 mg total) by mouth daily. 90 capsule 4  . ELIQUIS 5 MG TABS tablet TAKE 1 TABLET TWICE A DAY 180 tablet 1  . fluticasone (FLONASE) 50 MCG/ACT nasal spray Place 2 sprays into the nose as needed for rhinitis or allergies.     . hydrochlorothiazide (HYDRODIURIL) 25 MG tablet Take 25 mg by mouth daily.    Marland Kitchen levothyroxine (SYNTHROID, LEVOTHROID) 50 MCG tablet Take 50 mcg by mouth daily.    Marland Kitchen lisinopril (PRINIVIL,ZESTRIL) 20 MG tablet TAKE 1 TABLET DAILY 90 tablet 3  . pantoprazole (PROTONIX) 40 MG tablet Take 1 tablet (40 mg total) by mouth 2 (two) times daily before a meal. 60 tablet 5  .  potassium chloride SA (K-DUR,KLOR-CON) 20 MEQ tablet TAKE 1 TABLET DAILY 90 tablet 3  . triamcinolone cream (KENALOG) 0.1 % Apply 1 application topically as needed (dry legs).      No current facility-administered medications on file prior to visit.        Objective:   Physical Exam Blood pressure 110/70, pulse 60, temperature 98.2 F (36.8 C), height 6' (1.829 m), weight 223 lb 6.4 oz (101.334  kg). Alert and oriented. Skin warm and dry. Oral mucosa is moist.   . Sclera anicteric, conjunctivae is pink. Thyroid not enlarged. No cervical lymphadenopathy. Lungs clear. Heart rate irregular  Abdomen is soft. Bowel sounds are positive. No hepatomegaly. No abdominal masses felt. No tenderness.  No edema to lower extremities.  Stool brown and guaiac negative.    Lot [id number] Ex 9/17     Assessment & Plan:  Bloating. Change in his stool. Stools are smaller.  Colonic neoplasm needs to be ruled. Hx of colon polyps in the past (hyperplastic) Colonoscopy: The risks and benefits such as perforation, bleeding, and infection were reviewed with the patient and is agreeable. No more laxatives.

## 2016-01-27 NOTE — Telephone Encounter (Signed)
Patient needs trilyte 

## 2016-01-28 ENCOUNTER — Telehealth: Payer: Self-pay | Admitting: Cardiology

## 2016-01-28 MED ORDER — PEG 3350-KCL-NA BICARB-NACL 420 G PO SOLR: 4000.0000 mL | Freq: Once | ORAL | Status: DC

## 2016-01-28 NOTE — Telephone Encounter (Signed)
Request for surgical clearance:  1. What type of surgery is being performed? Colonoscopy   2. When is this surgery scheduled? 8/24  3. Are there any medications that need to be held prior to surgery and how long? Eliquis- 2 days  4. Name of physician performing surgery? Dr Laural Golden   5. What is your office phone and fax number? [phone] 6.

## 2016-01-28 NOTE — Telephone Encounter (Signed)
Hold apixaban 2 days prior to procedure and resume day after Andrew Compton  

## 2016-01-28 NOTE — Telephone Encounter (Signed)
Will fax this note to the number provided. 

## 2016-03-25 ENCOUNTER — Encounter (HOSPITAL_COMMUNITY): Admission: RE | Payer: Self-pay | Source: Ambulatory Visit

## 2016-03-25 ENCOUNTER — Ambulatory Visit (HOSPITAL_COMMUNITY)
Admission: RE | Admit: 2016-03-25 | Payer: Managed Care, Other (non HMO) | Source: Ambulatory Visit | Admitting: Internal Medicine

## 2016-03-25 SURGERY — COLONOSCOPY
Anesthesia: Moderate Sedation

## 2016-03-27 ENCOUNTER — Other Ambulatory Visit: Payer: Self-pay | Admitting: Cardiology

## 2016-06-30 ENCOUNTER — Telehealth: Payer: Self-pay | Admitting: *Deleted

## 2016-06-30 NOTE — Telephone Encounter (Signed)
eliquis PA paperwork faxed to the number provided.

## 2016-07-01 NOTE — Telephone Encounter (Signed)
eliquis approved through 06/30/2017

## 2016-07-20 ENCOUNTER — Encounter (INDEPENDENT_AMBULATORY_CARE_PROVIDER_SITE_OTHER): Payer: Self-pay | Admitting: Internal Medicine

## 2016-07-20 ENCOUNTER — Ambulatory Visit (INDEPENDENT_AMBULATORY_CARE_PROVIDER_SITE_OTHER): Payer: Managed Care, Other (non HMO) | Admitting: Internal Medicine

## 2016-07-20 VITALS — BP 130/98 | HR 68 | Temp 98.3°F | Resp 18 | Ht 72.0 in | Wt 225.3 lb

## 2016-07-20 DIAGNOSIS — K2100 Gastro-esophageal reflux disease with esophagitis, without bleeding: Secondary | ICD-10-CM

## 2016-07-20 DIAGNOSIS — K2270 Barrett's esophagus without dysplasia: Secondary | ICD-10-CM | POA: Diagnosis not present

## 2016-07-20 NOTE — Progress Notes (Signed)
Presenting complaint;  Follow-up for chronic GERD and Barrett's esophagus.  Subjective:  Andrew Compton is 65 year old Caucasian male who was diagnosed with long segment Barrett's esophagus in November 2011. He underwent EGD with ED for dysphagia in June 2017. He also had small nodule. Biopsy revealed Barrett's mucosa with reactive atypia. No dysplasia was identified. Biopsy also revealed single erosion. Patient has been maintained on double dose PPI now returns for follow-up visit. He rarely experiences heartburn. He is not having as fascia anymore. He denies regurgitation cough sort throat or hoarseness. She also denies abdominal pain melena or rectal bleeding. He has good appetite and his weight has been stable.     Current Medications: Outpatient Encounter Prescriptions as of 07/20/2016  Medication Sig  . diltiazem (CARDIZEM CD) 360 MG 24 hr capsule Take 1 capsule (360 mg total) by mouth daily.  Marland Kitchen ELIQUIS 5 MG TABS tablet TAKE 1 TABLET TWICE A DAY  . fluticasone (FLONASE) 50 MCG/ACT nasal spray Place 2 sprays into the nose as needed for rhinitis or allergies.   . hydrochlorothiazide (HYDRODIURIL) 25 MG tablet Take 25 mg by mouth daily.  Marland Kitchen levothyroxine (SYNTHROID, LEVOTHROID) 50 MCG tablet Take 50 mcg by mouth daily.  Marland Kitchen lisinopril (PRINIVIL,ZESTRIL) 20 MG tablet TAKE 1 TABLET DAILY  . metFORMIN (GLUCOPHAGE-XR) 500 MG 24 hr tablet Take 500 mg by mouth daily with breakfast.   . pantoprazole (PROTONIX) 40 MG tablet Take 1 tablet (40 mg total) by mouth 2 (two) times daily before a meal.  . potassium chloride SA (K-DUR,KLOR-CON) 20 MEQ tablet TAKE 1 TABLET DAILY  . triamcinolone cream (KENALOG) 0.1 % Apply 1 application topically as needed (dry legs).   . [DISCONTINUED] Ascorbic Acid (VITAMIN C) 1000 MG tablet Take 1,000 mg by mouth daily.  . [DISCONTINUED] polyethylene glycol-electrolytes (TRILYTE) 420 g solution Take 4,000 mLs by mouth once. (Patient not taking: Reported on 07/20/2016)   No  facility-administered encounter medications on file as of 07/20/2016.      Objective: Blood pressure (!) 130/98, pulse 68, temperature 98.3 F (36.8 C), temperature source Oral, resp. rate 18, height 6' (1.829 m), weight 225 lb 4.8 oz (102.2 kg). Patient is alert and in no acute distress. Conjunctiva is pink. Sclera is nonicteric Oropharyngeal mucosa is normal. No neck masses or thyromegaly noted. Cardiac exam with regular rhythm normal S1 and S2. No murmur or gallop noted. Lungs are clear to auscultation. Abdomen is full but soft and nontender without organomegaly or masses. No LE edema or clubbing noted.    Assessment:  #1. Chronic GERD complicated by long segment Barrett's esophagus. Esophageal biopsy in June 2017 revealed single erosion and reactive atypia. No dysplasia was identified. His symptoms are well controlled with double dose PPI. He will continue current regimen until next EGD in 18 months.   Plan:  Continue anti-reflux measures as before. Continue pantoprazole at 40 mg by mouth twice a day. Office visit in one year.

## 2016-07-20 NOTE — Patient Instructions (Signed)
Call if pantoprazole stops working. 

## 2016-09-03 ENCOUNTER — Encounter: Payer: Self-pay | Admitting: Internal Medicine

## 2016-09-22 NOTE — Progress Notes (Signed)
HPI: Fu atrial fibrillation. 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 He then developed sepsis. During the admission he was found to have recurrent paroxysmal atrial fibrillation. Echocardiogram in October of 2014 showed normal LV function. There was mild left atrial enlargement, mild right ventricular enlargement and mild aortic insufficiency. TSH 2.098. Patient was treated with Cardizem and metoprolol for rate control. He was placed on apixaban. Monitor 3/15 showed sinus with pacs and PVCs. 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 Since last seen, the patient has dyspnea with more extreme activities but not with routine activities. It is relieved with rest. It is not associated with chest pain. There is no orthopnea, PND or pedal edema. There is no syncope or palpitations. There is no exertional chest pain.   Current Outpatient Prescriptions  Medication Sig Dispense Refill  . diltiazem (CARDIZEM CD) 360 MG 24 hr capsule Take 1 capsule (360 mg total) by mouth daily. 90 capsule 4  . ELIQUIS 5 MG TABS tablet TAKE 1 TABLET TWICE A DAY 180 tablet 0  . fluticasone (FLONASE) 50 MCG/ACT nasal spray Place 2 sprays into the nose as needed for rhinitis or allergies.     . hydrochlorothiazide (HYDRODIURIL) 25 MG tablet Take 25 mg by mouth daily.    Marland Kitchen levothyroxine (SYNTHROID, LEVOTHROID) 50 MCG tablet Take 50 mcg by mouth daily.    Marland Kitchen lisinopril (PRINIVIL,ZESTRIL) 5 MG tablet Take 5 mg by mouth daily.    . metFORMIN (GLUCOPHAGE-XR) 500 MG 24 hr tablet Take 500 mg by mouth daily with breakfast.     . pantoprazole (PROTONIX) 40 MG tablet Take 1 tablet (40 mg total) by mouth 2 (two) times daily before a meal. 60 tablet 5  . potassium chloride SA (K-DUR,KLOR-CON) 20 MEQ tablet TAKE 1 TABLET DAILY 90 tablet 3  . triamcinolone cream (KENALOG) 0.1 % Apply 1 application topically as needed (dry  legs).      No current facility-administered medications for this visit.      Past Medical History:  Diagnosis Date  . Essential hypertension, benign   . GERD (gastroesophageal reflux disease)   . Gouty arthropathy, unspecified   . Hypothyroidism   . Other and unspecified hyperlipidemia   . Paroxysmal atrial fibrillation [facility])     Past Surgical History:  Procedure Laterality Date  . ESOPHAGEAL DILATION  01/15/2016   Procedure: ESOPHAGEAL DILATION;  Surgeon: Rogene Houston, MD;  [facility];  Service: Endoscopy;;  . ESOPHAGOGASTRODUODENOSCOPY N/A 01/15/2016   Procedure: ESOPHAGOGASTRODUODENOSCOPY (EGD);  Surgeon: Rogene Houston, MD;  Location: [facility];  Service: Endoscopy;  Laterality: N/A;  1030  . FRACTURE SURGERY Left    wrist, multiple surgeries  . NEUROLYSIS MEDIAN NERVE Right 11/27/2013   Neurolysis Brachial Plexus  . NO PAST SURGERIES    . SHOULDER SURGERY Right     Social History   Social History  . Marital status: Married    Spouse name: N/A  . Number of children: N/A  . Years of education: N/A   Occupational History  .  [employer]   Social History Main Topics  . Smoking status: Former Smoker    Quit date: 08/02/1996  . Smokeless tobacco: Never Used     Comment: quit 20 years ago  . Alcohol use Yes     Comment: Occasional  . Drug use: No  .  Sexual activity: Not on file   Other Topics Concern  . Not on file   Social History Narrative   Has no children    Family History  Problem Relation Age of Onset  . Breast cancer Mother   . Other Father     unknown death cause    ROS: Some fatigue but no fevers or chills, productive cough, hemoptysis, dysphasia, odynophagia, melena, hematochezia, dysuria, hematuria, rash, seizure activity, orthopnea, PND, pedal edema, claudication. Remaining systems are negative.  Physical Exam: Well-developed well-nourished in no acute distress.  Skin is warm and dry.  HEENT is  normal.  Neck is supple. No bruits Chest is clear to auscultation with normal expansion.  Cardiovascular exam is irregular Abdominal exam nontender or distended. No masses palpated. Extremities show no edema. neuro grossly intact  ECG-Atrial fibrillation at a rate of 78. Nonspecific ST changes.  A/P  1 Paroxysmal atrial fibrillation-patient in atrial fibrillation; no symptoms. We will continue Cardizem for rate control. Continue apixaban. Recent laboratories reviewed from December 2017. Creatinine 1.12, potassium 3.9.  2 hypertension-blood pressure controlled. Continue present medications.  3 iliac aneurysms-schedule follow-up ultrasound.  [name], MD

## 2016-09-25 ENCOUNTER — Other Ambulatory Visit: Payer: Self-pay | Admitting: Cardiology

## 2016-09-27 ENCOUNTER — Ambulatory Visit (INDEPENDENT_AMBULATORY_CARE_PROVIDER_SITE_OTHER): Payer: Managed Care, Other (non HMO) | Admitting: Cardiology

## 2016-09-27 ENCOUNTER — Encounter: Payer: Self-pay | Admitting: Cardiology

## 2016-09-27 VITALS — BP 110/80 | HR 78 | Ht 72.0 in | Wt 227.0 lb

## 2016-09-27 DIAGNOSIS — I723 Aneurysm of iliac artery: Secondary | ICD-10-CM

## 2016-09-27 DIAGNOSIS — I480 Paroxysmal atrial fibrillation: Secondary | ICD-10-CM

## 2016-09-27 NOTE — Patient Instructions (Signed)
Medication Instructions: NO CHANGE   Procedures/Testing: Your physician has requested that you have an abdominal aorta duplex. During this test, an ultrasound is used to evaluate the aorta. Allow 30 minutes for this exam. Do not eat after midnight the day before and avoid carbonated beverages   Follow-Up: Your physician wants you to follow-up in: [place] DR. CRENSHAW. You will receive a reminder letter in the mail two months in advance. If you don't receive a letter, please call our office to schedule the follow-up appointment.   If you need a refill on your cardiac medications before your next appointment, please call your pharmacy.

## 2016-10-20 ENCOUNTER — Ambulatory Visit (HOSPITAL_COMMUNITY)
Admission: RE | Admit: 2016-10-20 | Discharge: 2016-10-20 | Disposition: A | Discharge status: Home / Self Care | Payer: Managed Care, Other (non HMO) | Source: Ambulatory Visit | Attending: Cardiovascular Disease | Admitting: Cardiovascular Disease

## 2016-10-20 DIAGNOSIS — I723 Aneurysm of iliac artery: Secondary | ICD-10-CM | POA: Diagnosis present

## 2016-12-19 ENCOUNTER — Other Ambulatory Visit: Payer: Self-pay | Admitting: Cardiology

## 2016-12-20 NOTE — Telephone Encounter (Signed)
Rx request sent to pharmacy.  

## 2016-12-23 ENCOUNTER — Other Ambulatory Visit (INDEPENDENT_AMBULATORY_CARE_PROVIDER_SITE_OTHER): Payer: Self-pay | Admitting: Internal Medicine

## 2016-12-23 ENCOUNTER — Other Ambulatory Visit: Payer: Self-pay | Admitting: Cardiology

## 2016-12-24 ENCOUNTER — Other Ambulatory Visit: Payer: Self-pay | Admitting: Cardiology

## 2017-04-07 ENCOUNTER — Telehealth: Payer: Self-pay | Admitting: *Deleted

## 2017-04-07 DIAGNOSIS — I714 Abdominal aortic aneurysm, without rupture, unspecified: Secondary | ICD-10-CM

## 2017-04-07 NOTE — Telephone Encounter (Signed)
Spoke with pt, he is due to have the abdominal US to f/u on his AAA. He states he will be going on medicare in about 2 months and wants to wait ans schedule after he gets medicare. Aware the aneurysm had grown last time and that is why the follow up is sooner than one year. When he gets medicare he will give [place] a call to schedule.

## 2017-06-20 ENCOUNTER — Encounter (INDEPENDENT_AMBULATORY_CARE_PROVIDER_SITE_OTHER): Payer: Self-pay | Admitting: Internal Medicine

## 2017-06-22 ENCOUNTER — Other Ambulatory Visit: Payer: Self-pay | Admitting: Cardiology

## 2017-08-09 ENCOUNTER — Encounter (INDEPENDENT_AMBULATORY_CARE_PROVIDER_SITE_OTHER): Payer: Self-pay | Admitting: Internal Medicine

## 2017-08-09 ENCOUNTER — Ambulatory Visit (INDEPENDENT_AMBULATORY_CARE_PROVIDER_SITE_OTHER): Payer: Medicare Other | Admitting: Internal Medicine

## 2017-08-09 VITALS — BP 110/68 | HR 70 | Temp 98.1°F | Resp 18 | Ht 72.0 in | Wt 226.4 lb

## 2017-08-09 DIAGNOSIS — K2100 Gastro-esophageal reflux disease with esophagitis, without bleeding: Secondary | ICD-10-CM

## 2017-08-09 DIAGNOSIS — K2270 Barrett's esophagus without dysplasia: Secondary | ICD-10-CM | POA: Diagnosis not present

## 2017-08-09 MED ORDER — PANTOPRAZOLE SODIUM 40 MG PO TBEC: DELAYED_RELEASE_TABLET | ORAL | 3 refills | Status: DC

## 2017-08-09 NOTE — Progress Notes (Signed)
Presenting complaint;  Follow-up for complicated GERD.  Database and subjective.  Patient is 67 year old Caucasian male who has chronic GERD complicated by Barrett's esophagus which was diagnosed in November 2011.  He had 5 cm long segment.  He also has developed a esophageal stricture which was last dilated in June 2017.  Biopsy at the time of last EGD was negative for dysplasia.  He is here for scheduled visit.  He was last seen in December 2017.  He feels well. He rarely experiences heartburn.  He has not had any swallowing difficulty since his esophagus was dilated 18 months ago.  He also denies regurgitation.  He says his appetite is good.  He has not gained any weight since his last visit.  He also denies hoarseness chronic cough or sore throat.  Bowels move daily.  He denies melena or rectal bleeding.   Current Medications: Outpatient Encounter Medications as of 08/09/2017  Medication Sig  . diltiazem (TIAZAC) 360 MG 24 hr capsule TAKE 1 CAPSULE DAILY  . ELIQUIS 5 MG TABS tablet TAKE 1 TABLET TWICE A DAY  . fluticasone (FLONASE) 50 MCG/ACT nasal spray Place 2 sprays into the nose as needed for rhinitis or allergies.   . hydrochlorothiazide (HYDRODIURIL) 25 MG tablet Take 25 mg by mouth daily.  Marland Kitchen levothyroxine (SYNTHROID, LEVOTHROID) 50 MCG tablet Take 50 mcg by mouth daily.  Marland Kitchen lisinopril (PRINIVIL,ZESTRIL) 5 MG tablet Take 10 mg by mouth daily.   . metFORMIN (GLUCOPHAGE-XR) 500 MG 24 hr tablet Take 500 mg by mouth daily with breakfast.   . pantoprazole (PROTONIX) 40 MG tablet TAKE 1 TABLET TWICE A DAY BEFORE MEALS  . triamcinolone cream (KENALOG) 0.1 % Apply 1 application topically as needed (dry legs).   . [DISCONTINUED] diltiazem (CARDIZEM CD) 360 MG 24 hr capsule Take 1 capsule (360 mg total) by mouth daily. (Patient not taking: Reported on 08/09/2017)  . [DISCONTINUED] potassium chloride SA (K-DUR,KLOR-CON) 20 MEQ tablet TAKE 1 TABLET DAILY (Patient not taking: Reported on 08/09/2017)    No facility-administered encounter medications on file as of 08/09/2017.     Objective: Blood pressure 110/68, pulse 70, temperature 98.1 F (36.7 C), temperature source Oral, resp. rate 18, height 6' (1.829 m), weight 226 lb 6.4 oz (102.7 kg). Patient is alert and in no acute distress. Conjunctiva is pink. Sclera is nonicteric Oropharyngeal mucosa is normal. No neck masses or thyromegaly noted. Cardiac exam with regular rhythm normal S1 and S2. No murmur or gallop noted. Lungs are clear to auscultation. Abdomen is full but soft and nontender without organomegaly or masses. No LE edema or clubbing noted.   Assessment:  #1.  Chronic GERD complicated by long segment Barrett's esophagus.  Last EGD was in June 2017 revealing Barrett's without dysplasia and soft stricture at distal esophagus which was dilated to 18 mm with the balloon. Patient's symptoms are well controlled with current therapy.  Now that he is retiring I believe dietary habits will improve and he may do well with single dose of PPI.  #2.  Patient is average risk for CRC.  Last colonoscopy was in November 2011 with removal of two small polyps and these are hypoplastic.  Next screening colonoscopy due in November 2021. Marland Kitchen  Plan:  Patient will try pantoprazole 40 mg every other day. He will take Pepcid OTC 20 mg at bedtime on days when he does not take pantoprazole. If current approach does not work he can go back to taking PPI twice daily. Patient will call  with progress report in 4 weeks at which time will change PPI prescription if things go well.  Office visit in 1 year.

## 2017-08-09 NOTE — Patient Instructions (Signed)
Continue taking pantoprazole every morning for breakfast but do not take evening dose. Instead take Pepcid OTC 20 mg at bedtime. Call with progress report in 4 weeks or earlier if you start to have breakthrough symptoms at night.

## 2017-09-09 ENCOUNTER — Ambulatory Visit: Payer: Self-pay | Admitting: Cardiology

## 2017-09-16 ENCOUNTER — Ambulatory Visit (INDEPENDENT_AMBULATORY_CARE_PROVIDER_SITE_OTHER): Payer: Medicare Other | Admitting: Physician Assistant

## 2017-09-16 ENCOUNTER — Encounter: Payer: Self-pay | Admitting: Physician Assistant

## 2017-09-16 VITALS — BP 119/83 | HR 85 | Ht 72.0 in | Wt 223.0 lb

## 2017-09-16 DIAGNOSIS — I482 Chronic atrial fibrillation, unspecified: Secondary | ICD-10-CM

## 2017-09-16 DIAGNOSIS — I714 Abdominal aortic aneurysm, without rupture, unspecified: Secondary | ICD-10-CM

## 2017-09-16 DIAGNOSIS — E039 Hypothyroidism, unspecified: Secondary | ICD-10-CM

## 2017-09-16 DIAGNOSIS — I10 Essential (primary) hypertension: Secondary | ICD-10-CM

## 2017-09-16 DIAGNOSIS — Z01810 Encounter for preprocedural cardiovascular examination: Secondary | ICD-10-CM | POA: Diagnosis not present

## 2017-09-16 DIAGNOSIS — E785 Hyperlipidemia, unspecified: Secondary | ICD-10-CM

## 2017-09-16 MED ORDER — DILTIAZEM HCL ER BEADS 360 MG PO CP24: 360.0000 mg | ORAL_CAPSULE | Freq: Every day | ORAL | 3 refills | Status: DC

## 2017-09-16 MED ORDER — APIXABAN 5 MG PO TABS: 5.0000 mg | ORAL_TABLET | Freq: Two times a day (BID) | ORAL | 3 refills | Status: DC

## 2017-09-16 NOTE — Progress Notes (Signed)
Cardiology Office Note    Date:  09/17/2017   ID:  Andrew Compton, DOB August 12, 1950, MRN [id number]  PCP:  Andrew Hilding, MD  Cardiologist:  Dr. Stanford Compton   Chief Complaint  Patient presents with  . Follow-up  . Pre-op Exam    pending shoulder arthroscopy by Dr. Susa Compton    History of Present Illness:  Andrew Compton is a 67 y.o. male with PMH of HTN, hypothyroidism, HLD and chronic atrial fibrillation.  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  He developed sepsis and during the admission found to have recurrent atrial fibrillation.  Echocardiogram in October 2014 showed normal LV function, mild LAE, mild RV enlargement and mild AI.  He was treated with Cardizem and metoprolol for rate control.  TSH normal.  He was also placed on Eliquis as well.  Heart monitor in March 2015 showed sinus rhythm with PACs and PVCs.  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  Although the previous EKG in 2017 showed sinus rhythm, on his last follow-up in February 2018, he was in atrial fibrillation.  Rate control strategy was pursued.  Last AAA ultrasound obtained in March 2018 showed increased in the size of proximal aorta measuring 3.2 x 3.5 cm, stable bilateral common iliac artery aneurysm.  71-month follow-up image was recommended.  Patient presents today for cardiology office visit.  He denies any recent chest pain or shortness of breath.  He has no lower extremity edema, orthopnea or PND.  He is here today mainly for preoperative clearance prior to his right arthroscopy by Dr. Susa Compton.  He says he is able to climb up at least for 2 flight of stairs without any issue, he walks about 2-1/2 miles every other Compton without any exertional chest pain or shortness of breath.  He is cleared to proceed with surgery without any further workup.  I have discussed this with DOD Dr.  Martinique who is also agreeable.  He can hold Eliquis for 48 hours prior to surgery and restart after the surgery as soon as possible.  We have given him samples of Eliquis due to cost issues.  He will contact Andrew Compton again if cost is too much for him.  He started using a new medication supplement insurance recently.  Otherwise he is due for AAA ultrasound.  The only abnormality on physical exam is he has diminished breath sounds in the right base, however there is no rale, no rhonchi or wheezing.  I am not sure if he has a history of elevated hemidiaphragm or pleural effusion that could have caused this.  However he is largely asymptomatic, therefore I did not obtain a chest x-ray.  I asked him to contact Andrew Compton if he were to develop increasing shortness of breath.   Past Medical History:  Diagnosis Date  . Essential hypertension, benign   . GERD (gastroesophageal reflux disease)   . Gouty arthropathy, unspecified   . Hypothyroidism   . Other and unspecified hyperlipidemia   . Paroxysmal atrial fibrillation [facility])     Past Surgical History:  Procedure Laterality Date  . ESOPHAGEAL DILATION  01/15/2016   Procedure: ESOPHAGEAL DILATION;  Surgeon: Andrew Houston, MD;  [facility];  Service: Endoscopy;;  . ESOPHAGOGASTRODUODENOSCOPY N/A 01/15/2016   Procedure: ESOPHAGOGASTRODUODENOSCOPY (EGD);  Surgeon: Andrew Houston, MD;  [facility];  Service: Endoscopy;  Laterality: N/A;  1030  . FRACTURE SURGERY Left    wrist, multiple surgeries  . NEUROLYSIS MEDIAN NERVE Right 11/27/2013   Neurolysis Brachial Plexus  . NO PAST SURGERIES    . SHOULDER SURGERY Right     Current Medications: Outpatient Medications Prior to Visit  Medication Sig Dispense Refill  . fluticasone (FLONASE) 50 MCG/ACT nasal spray Place 2 sprays into the nose as needed for rhinitis or allergies.     . hydrochlorothiazide (HYDRODIURIL) 25 MG tablet Take 25 mg by mouth daily.    Marland Kitchen levothyroxine (SYNTHROID,  LEVOTHROID) 50 MCG tablet Take 50 mcg by mouth daily.    Marland Kitchen lisinopril (PRINIVIL,ZESTRIL) 5 MG tablet Take 5 mg by mouth daily.     . metFORMIN (GLUCOPHAGE-XR) 500 MG 24 hr tablet Take 500 mg by mouth daily with breakfast.     . pantoprazole (PROTONIX) 40 MG tablet TAKE 1 TABLET TWICE A Compton BEFORE MEALS 180 tablet 3  . triamcinolone cream (KENALOG) 0.1 % Apply 1 application topically as needed (dry legs).     Marland Kitchen diltiazem (TIAZAC) 360 MG 24 hr capsule TAKE 1 CAPSULE DAILY 90 capsule 3  . ELIQUIS 5 MG TABS tablet TAKE 1 TABLET TWICE A Compton 180 tablet 0   No facility-administered medications prior to visit.      Allergies:   Azithromycin; Penicillins; Levophed [norepinephrine bitartrate]; and Sulfa antibiotics   Social History   Socioeconomic History  . Marital status: Married    Spouse name: None  . Number of children: None  . Years of education: None  . Highest education level: None  Social Needs  . Financial resource strain: None  . Food insecurity - worry: None  . Food insecurity - inability: None  . Transportation needs - medical: None  . Transportation needs - non-medical: None  Occupational History    Employer: Andrew Compton    Comment: Dealer  Tobacco Use  . Smoking status: Former Smoker    Last attempt to quit: 08/02/1996    Years since quitting: 21.1  . Smokeless tobacco: Never Used  . Tobacco comment: quit 20 years ago  Substance and Sexual Activity  . Alcohol use: Yes    Comment: Occasional  . Drug use: No  . Sexual activity: None  Other Topics Concern  . None  Social History Narrative   Has no children     Family History:  The patient's family history includes Breast cancer in his mother; Other in his father.   ROS:   Please see the history of present illness.    ROS All other systems reviewed and are negative.   PHYSICAL EXAM:   VS:  BP 119/83   Pulse 85   Ht 6' (1.829 m)   Wt 223 lb (101.2 kg)   BMI 30.24 kg/m    GEN: Well nourished, well  developed, in no acute distress  HEENT: normal  Neck: no JVD, carotid bruits, or masses Cardiac: Irregularly irregular; no murmurs, rubs, or gallops,no edema  Respiratory:  clear to auscultation bilaterally +diminished breath sounds in the right base GI: soft, nontender, nondistended, + BS MS: no deformity or atrophy  Skin: warm and dry, no rash Neuro:  Alert and Oriented x 3, Strength and sensation are intact Psych: euthymic mood, full affect  Wt Readings from Last 3 Encounters:  09/16/17 223 lb (101.2 kg)  08/09/17 226 lb 6.4 oz (102.7 kg)  09/27/16 227 lb (103 kg)      Studies/Labs Reviewed:  EKG:  EKG is ordered today.  The ekg ordered today demonstrates atrial fibrillation, rate controlled  Recent Labs: No results found for requested labs within last 8760 hours.   Lipid Panel No results found for: CHOL, TRIG, HDL, CHOLHDL, VLDL, LDLCALC, LDLDIRECT  Additional studies/ records that were reviewed today include:   Echo 05/03/2013 LV EF: 60% -  65% Study Conclusions  - Left ventricle: The cavity size was normal. Wall thickness was normal. Systolic function was normal. The estimated ejection fraction was in the range of 60% to 65%. Wall motion was normal; there were no regional wall motion abnormalities. The study is not technically sufficient to allow evaluation of LV diastolic function. - Aortic valve: Mild regurgitation. - Left atrium: The atrium was mildly dilated. - Right ventricle: The cavity size was mildly dilated. - Atrial septum: No defect or patent foramen ovale was identified.    AAA Andrew Compton 10/20/2016 Technically challenging study. Increase in the size of the proximal aorta, with a suprarenal fusiform AAA, measuring 3.2 cm x 3.5 cm. Stable bilateral common iliac artery aneurysms. Normal caliber external iliac arteries, bilaterally. Aorto-iliac atherosclerosis, without focal stenosis. Patent IVC. F/U 6 months.   ASSESSMENT:    1. Preop  cardiovascular exam   2. Essential hypertension   3. AAA (abdominal aortic aneurysm) without rupture ([place])   4. Hypothyroidism, unspecified type   5. Hyperlipidemia, unspecified hyperlipidemia type   6. Chronic atrial fibrillation (HCC)      PLAN:  In order of problems listed above:  1. Preoperative clearance: Pending shoulder arthroscopy by Dr. Tonita Compton, he is able to complete at least 4 METs without any issue, I have discussed the case with DOD Dr. Martinique, he is cleared to proceed with procedure without any further workup.  He never had any history of CAD.  He can hold Eliquis for 48 hours prior to the surgery and restart after the surgery as soon as possible  2. Chronic atrial fibrillation on Eliquis, very well rate controlled  3. AAA: We will repeat ultrasound  4. Hypertension: Blood pressure fairly well controlled  5. Hyperlipidemia: He is not on any cholesterol medication, last lipid panel in April 2018 showed LDL 130s, elevated triglyceride as well.  We will need to consider starting a statin medication if repeat this year continued to show elevated LDL.  6. Hypothyroidism: Managed by primary care provider  7. Diminished breath sound in the right base: Unclear cause, he is completely asymptomatic.  Unsure if it is pleural effusion versus elevated hemidiaphragm, however given lack of symptom, I did not pursue any further study.    Medication Adjustments/Labs and Tests Ordered: Current medicines are reviewed at length with the patient today.  Concerns regarding medicines are outlined above.  Medication changes, Labs and Tests ordered today are listed in the Patient Instructions below. Patient Instructions  Medication Instructions: Your physician recommends that you continue on your current medications as directed. Please refer to the Current Medication list given to you today.  Samples for Eliquis 5 mg given today.  Testing/Procedures: Your physician has requested that you have  an abdominal aorta duplex. During this test, an ultrasound is used to evaluate the aorta. Allow 30 minutes for this exam. Do not eat after midnight the Compton before and avoid carbonated beverages   Follow-Up: Your physician wants you to follow-up in: 1 year with Dr. Stanford Compton. You will receive a reminder letter in the mail two months in advance. If you don't receive a letter, please call our  office to schedule the follow-up appointment.  If you need a refill on your cardiac medications before your next appointment, please call your pharmacy.     Hilbert Corrigan, [place]  09/17/2017 6:38 PM    [facility] [address], [place], [place]  [address] Phone: [phone]; Fax: [phone]

## 2017-09-16 NOTE — Patient Instructions (Addendum)
Medication Instructions: Your physician recommends that you continue on your current medications as directed. Please refer to the Current Medication list given to you today.  Samples for Eliquis 5 mg given today.  Testing/Procedures: Your physician has requested that you have an abdominal aorta duplex. During this test, an ultrasound is used to evaluate the aorta. Allow 30 minutes for this exam. Do not eat after midnight the day before and avoid carbonated beverages   Follow-Up: Your physician wants you to follow-up in: 1 year with Dr. Stanford Breed. You will receive a reminder letter in the mail two months in advance. If you don't receive a letter, please call our office to schedule the follow-up appointment.  If you need a refill on your cardiac medications before your next appointment, please call your pharmacy.

## 2017-09-17 ENCOUNTER — Encounter: Payer: Self-pay | Admitting: Physician Assistant

## 2017-09-21 ENCOUNTER — Ambulatory Visit (HOSPITAL_COMMUNITY)
Admission: RE | Admit: 2017-09-21 | Payer: Medicare Other | Source: Ambulatory Visit | Attending: Physician Assistant | Admitting: Physician Assistant

## 2017-09-23 ENCOUNTER — Ambulatory Visit (HOSPITAL_COMMUNITY)
Admission: RE | Admit: 2017-09-23 | Discharge: 2017-09-23 | Disposition: A | Discharge status: Home / Self Care | Payer: Medicare Other | Source: Ambulatory Visit | Attending: Cardiology | Admitting: Cardiology

## 2017-09-23 ENCOUNTER — Other Ambulatory Visit: Payer: Self-pay | Admitting: *Deleted

## 2017-09-23 DIAGNOSIS — Z87891 Personal history of nicotine dependence: Secondary | ICD-10-CM | POA: Insufficient documentation

## 2017-09-23 DIAGNOSIS — K8020 Calculus of gallbladder without cholecystitis without obstruction: Secondary | ICD-10-CM | POA: Insufficient documentation

## 2017-09-23 DIAGNOSIS — I714 Abdominal aortic aneurysm, without rupture, unspecified: Secondary | ICD-10-CM

## 2017-09-23 DIAGNOSIS — I10 Essential (primary) hypertension: Secondary | ICD-10-CM | POA: Diagnosis not present

## 2017-09-23 MED ORDER — APIXABAN 5 MG PO TABS: 5.0000 mg | ORAL_TABLET | Freq: Two times a day (BID) | ORAL | 3 refills | Status: DC

## 2017-09-23 MED ORDER — DILTIAZEM HCL ER BEADS 360 MG PO CP24: 360.0000 mg | ORAL_CAPSULE | Freq: Every day | ORAL | 3 refills | Status: DC

## 2017-09-23 NOTE — Telephone Encounter (Signed)
Patient having study today and told tech   Eliquis and Diltiazem sent to wrong pharmacy last week. Sent to [employer] order as requested

## 2017-10-05 DIAGNOSIS — D649 Anemia, unspecified: Secondary | ICD-10-CM | POA: Diagnosis not present

## 2017-10-05 DIAGNOSIS — E782 Mixed hyperlipidemia: Secondary | ICD-10-CM | POA: Diagnosis not present

## 2017-10-05 DIAGNOSIS — E039 Hypothyroidism, unspecified: Secondary | ICD-10-CM | POA: Diagnosis not present

## 2017-10-05 DIAGNOSIS — I482 Chronic atrial fibrillation: Secondary | ICD-10-CM | POA: Diagnosis not present

## 2017-10-05 DIAGNOSIS — E1165 Type 2 diabetes mellitus with hyperglycemia: Secondary | ICD-10-CM | POA: Diagnosis not present

## 2017-10-05 DIAGNOSIS — I10 Essential (primary) hypertension: Secondary | ICD-10-CM | POA: Diagnosis not present

## 2017-10-11 DIAGNOSIS — E782 Mixed hyperlipidemia: Secondary | ICD-10-CM | POA: Diagnosis not present

## 2017-10-11 DIAGNOSIS — I10 Essential (primary) hypertension: Secondary | ICD-10-CM | POA: Diagnosis not present

## 2017-10-11 DIAGNOSIS — Z6831 Body mass index (BMI) 31.0-31.9, adult: Secondary | ICD-10-CM | POA: Diagnosis not present

## 2017-10-11 DIAGNOSIS — E119 Type 2 diabetes mellitus without complications: Secondary | ICD-10-CM | POA: Diagnosis not present

## 2017-10-11 DIAGNOSIS — D649 Anemia, unspecified: Secondary | ICD-10-CM | POA: Diagnosis not present

## 2017-10-11 DIAGNOSIS — M109 Gout, unspecified: Secondary | ICD-10-CM | POA: Diagnosis not present

## 2017-10-11 DIAGNOSIS — N401 Enlarged prostate with lower urinary tract symptoms: Secondary | ICD-10-CM | POA: Diagnosis not present

## 2017-10-11 DIAGNOSIS — I482 Chronic atrial fibrillation: Secondary | ICD-10-CM | POA: Diagnosis not present

## 2017-11-24 ENCOUNTER — Telehealth: Payer: Self-pay | Admitting: *Deleted

## 2017-11-24 NOTE — Telephone Encounter (Signed)
Patient walked in requesting samples of Eliquis:  Medication samples have been provided to the patient.  Drug name: Eliquis 5 mg  Qty: 3 boxes    LOT: [id number]  Exp.Date: 03/21

## 2017-12-16 ENCOUNTER — Telehealth: Payer: Self-pay | Admitting: Cardiology

## 2017-12-16 NOTE — Telephone Encounter (Signed)
Patient calling the office for samples of medication:   week 1.  What medication and dosage are you requesting samples for? [name] says he is coming from [place] next week  2.  Are you currently out of this medication?  Enough until next week

## 2017-12-16 NOTE — Telephone Encounter (Signed)
Patient aware samples are at the front desk for pick up  LOT=[id number] EXP=MAR 2021

## 2017-12-22 DIAGNOSIS — M1712 Unilateral primary osteoarthritis, left knee: Secondary | ICD-10-CM | POA: Diagnosis not present

## 2017-12-22 DIAGNOSIS — M25562 Pain in left knee: Secondary | ICD-10-CM | POA: Diagnosis not present

## 2018-02-07 DIAGNOSIS — E039 Hypothyroidism, unspecified: Secondary | ICD-10-CM | POA: Diagnosis not present

## 2018-02-07 DIAGNOSIS — I482 Chronic atrial fibrillation: Secondary | ICD-10-CM | POA: Diagnosis not present

## 2018-02-07 DIAGNOSIS — I10 Essential (primary) hypertension: Secondary | ICD-10-CM | POA: Diagnosis not present

## 2018-02-07 DIAGNOSIS — E782 Mixed hyperlipidemia: Secondary | ICD-10-CM | POA: Diagnosis not present

## 2018-02-07 DIAGNOSIS — E1165 Type 2 diabetes mellitus with hyperglycemia: Secondary | ICD-10-CM | POA: Diagnosis not present

## 2018-02-07 DIAGNOSIS — R5383 Other fatigue: Secondary | ICD-10-CM | POA: Diagnosis not present

## 2018-02-09 DIAGNOSIS — Z6831 Body mass index (BMI) 31.0-31.9, adult: Secondary | ICD-10-CM | POA: Diagnosis not present

## 2018-02-09 DIAGNOSIS — E119 Type 2 diabetes mellitus without complications: Secondary | ICD-10-CM | POA: Diagnosis not present

## 2018-02-09 DIAGNOSIS — I482 Chronic atrial fibrillation: Secondary | ICD-10-CM | POA: Diagnosis not present

## 2018-02-09 DIAGNOSIS — E782 Mixed hyperlipidemia: Secondary | ICD-10-CM | POA: Diagnosis not present

## 2018-02-09 DIAGNOSIS — D649 Anemia, unspecified: Secondary | ICD-10-CM | POA: Diagnosis not present

## 2018-02-09 DIAGNOSIS — N401 Enlarged prostate with lower urinary tract symptoms: Secondary | ICD-10-CM | POA: Diagnosis not present

## 2018-02-09 DIAGNOSIS — I10 Essential (primary) hypertension: Secondary | ICD-10-CM | POA: Diagnosis not present

## 2018-02-09 DIAGNOSIS — M109 Gout, unspecified: Secondary | ICD-10-CM | POA: Diagnosis not present

## 2018-05-15 ENCOUNTER — Telehealth: Payer: Self-pay | Admitting: Cardiology

## 2018-05-15 NOTE — Telephone Encounter (Signed)
Spoke with pt. Adv pt that we only have 1 box of Eliquis 5mg  samples. I will leave at the front desk for him to pick up. Pt voiced appreciation.

## 2018-05-15 NOTE — Telephone Encounter (Signed)
New Message:     Patient calling the office for samples of medication:   1.  What medication and dosage are you requesting samples for? apixaban (ELIQUIS) 5 MG TABS tablet  2.  Are you currently out of this medication? NO

## 2018-05-16 DIAGNOSIS — M7541 Impingement syndrome of right shoulder: Secondary | ICD-10-CM | POA: Diagnosis not present

## 2018-05-16 DIAGNOSIS — M1712 Unilateral primary osteoarthritis, left knee: Secondary | ICD-10-CM | POA: Diagnosis not present

## 2018-05-16 DIAGNOSIS — M25562 Pain in left knee: Secondary | ICD-10-CM | POA: Diagnosis not present

## 2018-05-16 DIAGNOSIS — G540 Brachial plexus disorders: Secondary | ICD-10-CM | POA: Diagnosis not present

## 2018-05-27 DIAGNOSIS — Z23 Encounter for immunization: Secondary | ICD-10-CM | POA: Diagnosis not present

## 2018-06-12 DIAGNOSIS — L989 Disorder of the skin and subcutaneous tissue, unspecified: Secondary | ICD-10-CM | POA: Diagnosis not present

## 2018-06-12 DIAGNOSIS — D229 Melanocytic nevi, unspecified: Secondary | ICD-10-CM | POA: Diagnosis not present

## 2018-06-12 DIAGNOSIS — D0359 Melanoma in situ of other part of trunk: Secondary | ICD-10-CM | POA: Diagnosis not present

## 2018-06-12 DIAGNOSIS — L814 Other melanin hyperpigmentation: Secondary | ICD-10-CM | POA: Diagnosis not present

## 2018-06-12 DIAGNOSIS — L820 Inflamed seborrheic keratosis: Secondary | ICD-10-CM | POA: Diagnosis not present

## 2018-06-12 DIAGNOSIS — D225 Melanocytic nevi of trunk: Secondary | ICD-10-CM | POA: Diagnosis not present

## 2018-06-12 DIAGNOSIS — D485 Neoplasm of uncertain behavior of skin: Secondary | ICD-10-CM | POA: Diagnosis not present

## 2018-06-12 DIAGNOSIS — L821 Other seborrheic keratosis: Secondary | ICD-10-CM | POA: Diagnosis not present

## 2018-06-12 DIAGNOSIS — L918 Other hypertrophic disorders of the skin: Secondary | ICD-10-CM | POA: Diagnosis not present

## 2018-06-23 DIAGNOSIS — D0362 Melanoma in situ of left upper limb, including shoulder: Secondary | ICD-10-CM | POA: Diagnosis not present

## 2018-06-23 DIAGNOSIS — C4359 Malignant melanoma of other part of trunk: Secondary | ICD-10-CM | POA: Diagnosis not present

## 2018-06-23 DIAGNOSIS — C4362 Malignant melanoma of left upper limb, including shoulder: Secondary | ICD-10-CM | POA: Diagnosis not present

## 2018-07-02 HISTORY — PX: MELANOMA EXCISION: SHX5266

## 2018-07-10 DIAGNOSIS — Z87891 Personal history of nicotine dependence: Secondary | ICD-10-CM | POA: Diagnosis not present

## 2018-07-10 DIAGNOSIS — M109 Gout, unspecified: Secondary | ICD-10-CM | POA: Diagnosis not present

## 2018-07-10 DIAGNOSIS — Z7984 Long term (current) use of oral hypoglycemic drugs: Secondary | ICD-10-CM | POA: Diagnosis not present

## 2018-07-10 DIAGNOSIS — I4891 Unspecified atrial fibrillation: Secondary | ICD-10-CM | POA: Diagnosis not present

## 2018-07-10 DIAGNOSIS — Z85828 Personal history of other malignant neoplasm of skin: Secondary | ICD-10-CM | POA: Diagnosis not present

## 2018-07-10 DIAGNOSIS — E039 Hypothyroidism, unspecified: Secondary | ICD-10-CM | POA: Diagnosis not present

## 2018-07-10 DIAGNOSIS — E119 Type 2 diabetes mellitus without complications: Secondary | ICD-10-CM | POA: Diagnosis not present

## 2018-07-10 DIAGNOSIS — C4359 Malignant melanoma of other part of trunk: Secondary | ICD-10-CM | POA: Diagnosis not present

## 2018-07-10 DIAGNOSIS — C4362 Malignant melanoma of left upper limb, including shoulder: Secondary | ICD-10-CM | POA: Diagnosis not present

## 2018-07-10 DIAGNOSIS — I10 Essential (primary) hypertension: Secondary | ICD-10-CM | POA: Diagnosis not present

## 2018-07-10 DIAGNOSIS — K219 Gastro-esophageal reflux disease without esophagitis: Secondary | ICD-10-CM | POA: Diagnosis not present

## 2018-07-10 DIAGNOSIS — E785 Hyperlipidemia, unspecified: Secondary | ICD-10-CM | POA: Diagnosis not present

## 2018-07-10 DIAGNOSIS — I480 Paroxysmal atrial fibrillation: Secondary | ICD-10-CM | POA: Diagnosis not present

## 2018-07-10 DIAGNOSIS — Z79899 Other long term (current) drug therapy: Secondary | ICD-10-CM | POA: Diagnosis not present

## 2018-07-17 DIAGNOSIS — Z85828 Personal history of other malignant neoplasm of skin: Secondary | ICD-10-CM | POA: Diagnosis not present

## 2018-07-17 DIAGNOSIS — I480 Paroxysmal atrial fibrillation: Secondary | ICD-10-CM | POA: Diagnosis not present

## 2018-07-17 DIAGNOSIS — C4359 Malignant melanoma of other part of trunk: Secondary | ICD-10-CM | POA: Diagnosis not present

## 2018-07-17 DIAGNOSIS — C4362 Malignant melanoma of left upper limb, including shoulder: Secondary | ICD-10-CM | POA: Diagnosis not present

## 2018-07-17 DIAGNOSIS — I10 Essential (primary) hypertension: Secondary | ICD-10-CM | POA: Diagnosis not present

## 2018-07-17 DIAGNOSIS — D0362 Melanoma in situ of left upper limb, including shoulder: Secondary | ICD-10-CM | POA: Diagnosis not present

## 2018-07-17 DIAGNOSIS — Z87891 Personal history of nicotine dependence: Secondary | ICD-10-CM | POA: Diagnosis not present

## 2018-07-17 DIAGNOSIS — R590 Localized enlarged lymph nodes: Secondary | ICD-10-CM | POA: Diagnosis not present

## 2018-07-17 DIAGNOSIS — L905 Scar conditions and fibrosis of skin: Secondary | ICD-10-CM | POA: Diagnosis not present

## 2018-08-04 DIAGNOSIS — C4359 Malignant melanoma of other part of trunk: Secondary | ICD-10-CM | POA: Diagnosis not present

## 2018-08-04 DIAGNOSIS — Z483 Aftercare following surgery for neoplasm: Secondary | ICD-10-CM | POA: Diagnosis not present

## 2018-08-08 ENCOUNTER — Ambulatory Visit (INDEPENDENT_AMBULATORY_CARE_PROVIDER_SITE_OTHER): Payer: Medicare Other | Admitting: Internal Medicine

## 2018-08-08 ENCOUNTER — Encounter (INDEPENDENT_AMBULATORY_CARE_PROVIDER_SITE_OTHER): Payer: Self-pay | Admitting: Internal Medicine

## 2018-08-08 VITALS — BP 142/90 | HR 94 | Temp 98.3°F | Resp 18 | Ht 72.0 in | Wt 229.0 lb

## 2018-08-08 DIAGNOSIS — K2100 Gastro-esophageal reflux disease with esophagitis, without bleeding: Secondary | ICD-10-CM

## 2018-08-08 DIAGNOSIS — K2270 Barrett's esophagus without dysplasia: Secondary | ICD-10-CM

## 2018-08-08 MED ORDER — PANTOPRAZOLE SODIUM 40 MG PO TBEC: DELAYED_RELEASE_TABLET | ORAL | 3 refills | Status: DC

## 2018-08-08 NOTE — Patient Instructions (Signed)
Notify if pantoprazole stops working or you have swallowing difficulty.

## 2018-08-08 NOTE — Progress Notes (Signed)
Presenting complaint;  Follow-up for chronic GERD complicated by long segment Barrett's esophagus.  Database and subjective:   Patient is 68 year old Caucasian male who has chronic GERD complicated by Barrett's esophagus who is here for yearly visit.  Barrett's esophagus was diagnosed in November 2011.  He had 5 cm long segment.  He also has developed a esophageal stricture which was last dilated in June 2017.  Biopsy at the time of last EGD was negative for dysplasia. He was last seen 1 year ago.  He was advised to back off on PPI and use H2B as a second dose. Patient states the change in therapy did not work.  He began to have dysphagia.  He rarely experiences heartburn.  Since he has been back on pantoprazole twice daily he has no problem whatsoever.  He denies hoarseness sore throat chronic cough or dysphagia.  He has no side effects with PPI.  He also has no concerns about taking this medication.  He states he is trying to change his eating habits for 1 he is trying to eat slowly.  He denies abdominal pain melena or rectal bleeding.  He says his bowels move 2-4 times a day.  This is been a pattern for years.  He does not have urgency nocturnal bowel movement.  His appetite is good and his weight has been stable. He requests new prescription for pantoprazole as insurance requirements have changed.    Current Medications: Outpatient Encounter Medications as of 08/08/2018  Medication Sig  . apixaban (ELIQUIS) 5 MG TABS tablet Take 1 tablet (5 mg total) by mouth 2 (two) times daily.  Marland Kitchen diltiazem (TIAZAC) 360 MG 24 hr capsule Take 1 capsule (360 mg total) by mouth daily.  . fluticasone (FLONASE) 50 MCG/ACT nasal spray Place 2 sprays into the nose as needed for rhinitis or allergies.   . hydrochlorothiazide (HYDRODIURIL) 25 MG tablet Take 25 mg by mouth daily.  Marland Kitchen levothyroxine (SYNTHROID, LEVOTHROID) 50 MCG tablet Take 50 mcg by mouth daily.  Marland Kitchen lisinopril (PRINIVIL,ZESTRIL) 5 MG tablet Take 5 mg by  mouth daily.   . metFORMIN (GLUCOPHAGE-XR) 500 MG 24 hr tablet Take 500 mg by mouth daily with breakfast.   . pantoprazole (PROTONIX) 40 MG tablet TAKE 1 TABLET TWICE A DAY BEFORE MEALS  . triamcinolone cream (KENALOG) 0.1 % Apply 1 application topically as needed (dry legs).    No facility-administered encounter medications on file as of 08/08/2018.      Objective: Blood pressure (!) 142/90, pulse 94, temperature 98.3 F (36.8 C), resp. rate 18, height 6' (1.829 m), weight 229 lb (103.9 kg). Patient is alert and in no acute distress. Conjunctiva is pink. Sclera is nonicteric Oropharyngeal mucosa is normal. No neck masses or thyromegaly noted. Cardiac exam with regular rhythm normal S1 and S2. No murmur or gallop noted. Lungs are clear to auscultation. Abdomen is full but soft and nontender with organomegaly or masses. No LE edema or clubbing noted.   Assessment:  #1.  Chronic GERD complicated by long segment Barrett's esophagus.  He did not tolerate PPI dose reduction.  He is back on pantoprazole 40 mg twice daily and symptom control appears to be satisfactory.  He has history of distal esophageal stricture which was dilated few times in the past.  Last EGD was in June 2007.  #2.  Patient is average risk for CRC.  Last colonoscopy was in November 2011 with removal of 2 small polyps and these are hyperplastic polyps.   Plan:  Antireflux measures reinforced. New prescription given for pantoprazole 40 mg twice daily for 3 months with 3 refills. Office visit in 1 year. Will consider next EGD and colonoscopy in 2022.

## 2018-08-11 DIAGNOSIS — C4359 Malignant melanoma of other part of trunk: Secondary | ICD-10-CM | POA: Diagnosis not present

## 2018-08-11 DIAGNOSIS — L7634 Postprocedural seroma of skin and subcutaneous tissue following other procedure: Secondary | ICD-10-CM | POA: Diagnosis not present

## 2018-08-14 DIAGNOSIS — E039 Hypothyroidism, unspecified: Secondary | ICD-10-CM | POA: Diagnosis not present

## 2018-08-14 DIAGNOSIS — E782 Mixed hyperlipidemia: Secondary | ICD-10-CM | POA: Diagnosis not present

## 2018-08-14 DIAGNOSIS — R5383 Other fatigue: Secondary | ICD-10-CM | POA: Diagnosis not present

## 2018-08-14 DIAGNOSIS — E1165 Type 2 diabetes mellitus with hyperglycemia: Secondary | ICD-10-CM | POA: Diagnosis not present

## 2018-08-14 DIAGNOSIS — D649 Anemia, unspecified: Secondary | ICD-10-CM | POA: Diagnosis not present

## 2018-08-14 DIAGNOSIS — I10 Essential (primary) hypertension: Secondary | ICD-10-CM | POA: Diagnosis not present

## 2018-08-17 ENCOUNTER — Encounter (INDEPENDENT_AMBULATORY_CARE_PROVIDER_SITE_OTHER): Payer: Self-pay

## 2018-08-17 ENCOUNTER — Other Ambulatory Visit (INDEPENDENT_AMBULATORY_CARE_PROVIDER_SITE_OTHER): Payer: Self-pay | Admitting: *Deleted

## 2018-08-17 MED ORDER — PANTOPRAZOLE SODIUM 40 MG PO TBEC: DELAYED_RELEASE_TABLET | ORAL | 3 refills | Status: DC

## 2018-08-18 DIAGNOSIS — E039 Hypothyroidism, unspecified: Secondary | ICD-10-CM | POA: Diagnosis not present

## 2018-08-18 DIAGNOSIS — E119 Type 2 diabetes mellitus without complications: Secondary | ICD-10-CM | POA: Diagnosis not present

## 2018-08-18 DIAGNOSIS — Z6832 Body mass index (BMI) 32.0-32.9, adult: Secondary | ICD-10-CM | POA: Diagnosis not present

## 2018-08-18 DIAGNOSIS — I10 Essential (primary) hypertension: Secondary | ICD-10-CM | POA: Diagnosis not present

## 2018-08-18 DIAGNOSIS — E782 Mixed hyperlipidemia: Secondary | ICD-10-CM | POA: Diagnosis not present

## 2018-08-18 DIAGNOSIS — D649 Anemia, unspecified: Secondary | ICD-10-CM | POA: Diagnosis not present

## 2018-08-18 DIAGNOSIS — N401 Enlarged prostate with lower urinary tract symptoms: Secondary | ICD-10-CM | POA: Diagnosis not present

## 2018-08-18 DIAGNOSIS — I4891 Unspecified atrial fibrillation: Secondary | ICD-10-CM | POA: Diagnosis not present

## 2018-09-01 DIAGNOSIS — R102 Pelvic and perineal pain: Secondary | ICD-10-CM | POA: Diagnosis not present

## 2018-09-01 DIAGNOSIS — R350 Frequency of micturition: Secondary | ICD-10-CM | POA: Diagnosis not present

## 2018-09-01 DIAGNOSIS — N401 Enlarged prostate with lower urinary tract symptoms: Secondary | ICD-10-CM | POA: Diagnosis not present

## 2018-09-01 DIAGNOSIS — R351 Nocturia: Secondary | ICD-10-CM | POA: Diagnosis not present

## 2018-09-01 DIAGNOSIS — Z6830 Body mass index (BMI) 30.0-30.9, adult: Secondary | ICD-10-CM | POA: Diagnosis not present

## 2018-09-07 DIAGNOSIS — Z6831 Body mass index (BMI) 31.0-31.9, adult: Secondary | ICD-10-CM | POA: Diagnosis not present

## 2018-09-07 DIAGNOSIS — K759 Inflammatory liver disease, unspecified: Secondary | ICD-10-CM | POA: Diagnosis not present

## 2018-09-11 NOTE — Progress Notes (Signed)
HPI: Fu atrial fibrillation. 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 He then developed sepsis. During the admission he was found to have recurrent paroxysmal atrial fibrillation. Echocardiogram in October of 2014 showed normal LV function. There was mild left atrial enlargement, mild right ventricular enlargement and mild aortic insufficiency. TSH 2.098. Patient was treated with Cardizem and metoprolol for rate control. He was placed on apixaban. Monitor 3/15 showed sinus with pacs and PVCs. Abdominal ultrasound February 2019 showed abdominal aortic aneurysm of 3.5 cm and dilated right and left common iliac arteries.  Since last seen, the patient has dyspnea with more extreme activities but not with routine activities. It is relieved with rest. It is not associated with chest pain. There is no orthopnea, PND or pedal edema. There is no syncope or palpitations. There is no exertional chest pain.   Current Outpatient Medications  Medication Sig Dispense Refill  . apixaban (ELIQUIS) 5 MG TABS tablet Take 1 tablet (5 mg total) by mouth 2 (two) times daily. 180 tablet 3  . colchicine 0.6 MG tablet Take 0.6 mg by mouth daily. Patient takes as needed    . diltiazem (TIAZAC) 360 MG 24 hr capsule Take 1 capsule (360 mg total) by mouth daily. 90 capsule 3  . fluticasone (FLONASE) 50 MCG/ACT nasal spray Place 2 sprays into the nose as needed for rhinitis or allergies.     . hydrochlorothiazide (HYDRODIURIL) 25 MG tablet Take 25 mg by mouth daily.    Marland Kitchen levothyroxine (SYNTHROID, LEVOTHROID) 50 MCG tablet Take 50 mcg by mouth daily.    Marland Kitchen lisinopril (PRINIVIL,ZESTRIL) 5 MG tablet Take 5 mg by mouth daily.     . metFORMIN (GLUCOPHAGE-XR) 500 MG 24 hr tablet Take 500 mg by mouth daily with breakfast.     . pantoprazole (PROTONIX) 40 MG tablet TAKE 1 TABLET TWICE A DAY BEFORE MEALS 180 tablet 3  . tamsulosin (FLOMAX) 0.4 MG CAPS capsule Take 0.4 mg by mouth  daily.    Marland Kitchen triamcinolone cream (KENALOG) 0.1 % Apply 1 application topically as needed (dry legs).      No current facility-administered medications for this visit.      Past Medical History:  Diagnosis Date  . Essential hypertension, benign   . GERD (gastroesophageal reflux disease)   . Gouty arthropathy, unspecified   . Hypothyroidism   . Other and unspecified hyperlipidemia   . Paroxysmal atrial fibrillation [facility])     Past Surgical History:  Procedure Laterality Date  . ESOPHAGEAL DILATION  01/15/2016   Procedure: ESOPHAGEAL DILATION;  Surgeon: Rogene Houston, MD;  [facility];  Service: Endoscopy;;  . ESOPHAGOGASTRODUODENOSCOPY N/A 01/15/2016   Procedure: ESOPHAGOGASTRODUODENOSCOPY (EGD);  Surgeon: Rogene Houston, MD;  Location[facility];  Service: Endoscopy;  Laterality: N/A;  1030  . FRACTURE SURGERY Left    wrist, multiple surgeries  . NEUROLYSIS MEDIAN NERVE Right 11/27/2013   Neurolysis Brachial Plexus  . NO PAST SURGERIES    . SHOULDER SURGERY Right     Social History   Socioeconomic History  . Marital status: Married    Spouse name: Not on file  . Number of children: Not on file  . Years of education: Not on file  . Highest education level: Not on file  Occupational History    Employer: [employer]    Comment: Dealer  Social Needs  . Financial resource strain: Not on file  . Food  insecurity:    Worry: Not on file    Inability: Not on file  . Transportation needs:    Medical: Not on file    Non-medical: Not on file  Tobacco Use  . Smoking status: Former Smoker    Last attempt to quit: 08/02/1996    Years since quitting: 22.1  . Smokeless tobacco: Never Used  . Tobacco comment: quit 20 years ago  Substance and Sexual Activity  . Alcohol use: Yes    Comment: Occasional  . Drug use: No  . Sexual activity: Not on file  Lifestyle  . Physical activity:    Days per week: Not on file    Minutes per session: Not on file  .  Stress: Not on file  Relationships  . Social connections:    Talks on phone: Not on file    Gets together: Not on file    Attends religious service: Not on file    Active member of club or organization: Not on file    Attends meetings of clubs or organizations: Not on file    Relationship status: Not on file  . Intimate partner violence:    Fear of current or ex partner: Not on file    Emotionally abused: Not on file    Physically abused: Not on file    Forced sexual activity: Not on file  Other Topics Concern  . Not on file  Social History Narrative   Has no children    Family History  Problem Relation Age of Onset  . Breast cancer Mother   . Other Father        unknown death cause    ROS: no fevers or chills, productive cough, hemoptysis, dysphasia, odynophagia, melena, hematochezia, dysuria, hematuria, rash, seizure activity, orthopnea, PND, pedal edema, claudication. Remaining systems are negative.  Physical Exam: Well-developed well-nourished in no acute distress.  Skin is warm and dry.  HEENT is normal.  Neck is supple.  Chest is clear to auscultation with normal expansion.  Cardiovascular exam is irregular  Abdominal exam nontender or distended. No masses palpated. Extremities show no edema. neuro grossly intact  ECG-atrial fibrillation at a rate of 102, no ST changes.  Personally reviewed  A/P  1 permanent atrial fibrillation-patient remains in atrial fibrillation but remains asymptomatic.  Continue Cardizem for rate control.  Continue apixaban. Repeat echocardiogram.  2 hypertension-blood pressure is controlled.  Continue present medications and follow.  3 history of iliac aneurysms/abdominal aortic aneurysm-we will arrange follow-up ultrasound.  [name], MD

## 2018-09-25 ENCOUNTER — Encounter: Payer: Self-pay | Admitting: Cardiology

## 2018-09-25 ENCOUNTER — Other Ambulatory Visit: Payer: Self-pay | Admitting: *Deleted

## 2018-09-25 ENCOUNTER — Ambulatory Visit (INDEPENDENT_AMBULATORY_CARE_PROVIDER_SITE_OTHER): Payer: Medicare Other | Admitting: Cardiology

## 2018-09-25 VITALS — BP 120/82 | HR 60 | Ht 72.0 in | Wt 227.5 lb

## 2018-09-25 DIAGNOSIS — I714 Abdominal aortic aneurysm, without rupture, unspecified: Secondary | ICD-10-CM

## 2018-09-25 DIAGNOSIS — I723 Aneurysm of iliac artery: Secondary | ICD-10-CM | POA: Diagnosis not present

## 2018-09-25 DIAGNOSIS — I480 Paroxysmal atrial fibrillation: Secondary | ICD-10-CM

## 2018-09-25 MED ORDER — APIXABAN 5 MG PO TABS: 5.0000 mg | ORAL_TABLET | Freq: Two times a day (BID) | ORAL | 3 refills | Status: DC

## 2018-09-25 MED ORDER — DILTIAZEM HCL ER BEADS 360 MG PO CP24: 360.0000 mg | ORAL_CAPSULE | Freq: Every day | ORAL | 3 refills | Status: DC

## 2018-09-25 NOTE — Patient Instructions (Addendum)
Medication Instructions:  NO CHANGE If you need a refill on your cardiac medications before your next appointment, please call your pharmacy.   Lab work: If you have labs (blood work) drawn today and your tests are completely normal, you will receive your results only by: Marland Kitchen MyChart Message (if you have MyChart) OR . A paper copy in the mail If you have any lab test that is abnormal or we need to change your treatment, we will call you to review the results.  Testing/Procedures: Your physician has requested that you have an echocardiogram. Echocardiography is a painless test that uses sound waves to create images of your heart. It provides your doctor with information about the size and shape of your heart and how well your heart's chambers and valves are working. This procedure takes approximately one hour. There are no restrictions for this procedure.  [facility] has requested that you have an abdominal aorta duplex. During this test, an ultrasound is used to evaluate the aorta. Allow 30 minutes for this exam. Do not eat after midnight the day before and avoid carbonated beverages [place]: At [facility], you and your health needs are our priority.  As part of our continuing mission to provide you with exceptional heart care, we have created designated Provider Care Teams.  These Care Teams include your primary Cardiologist (physician) and Advanced Practice Providers (APPs -  Physician Assistants and Nurse Practitioners) who all work together to provide you with the care you need, when you need it. You will need a follow up appointment in 12 months.  Please call our office 2 months in advance to schedule this appointment.  You may see [name] MD or one of the following Advanced Practice Providers on your designated Care Team:   Kerin Ransom, PA-C Roby Lofts, [place] . Sande Rives, PA-C

## 2018-10-26 ENCOUNTER — Other Ambulatory Visit (HOSPITAL_COMMUNITY): Payer: Self-pay

## 2018-10-26 ENCOUNTER — Other Ambulatory Visit (HOSPITAL_COMMUNITY): Payer: Medicare Other

## 2018-11-29 ENCOUNTER — Other Ambulatory Visit (HOSPITAL_COMMUNITY): Payer: Medicare Other

## 2018-11-29 ENCOUNTER — Other Ambulatory Visit (HOSPITAL_COMMUNITY): Payer: Self-pay

## 2019-01-02 DIAGNOSIS — L570 Actinic keratosis: Secondary | ICD-10-CM | POA: Diagnosis not present

## 2019-01-02 DIAGNOSIS — D1801 Hemangioma of skin and subcutaneous tissue: Secondary | ICD-10-CM | POA: Diagnosis not present

## 2019-01-02 DIAGNOSIS — D485 Neoplasm of uncertain behavior of skin: Secondary | ICD-10-CM | POA: Diagnosis not present

## 2019-01-02 DIAGNOSIS — L989 Disorder of the skin and subcutaneous tissue, unspecified: Secondary | ICD-10-CM | POA: Diagnosis not present

## 2019-01-02 DIAGNOSIS — D2261 Melanocytic nevi of right upper limb, including shoulder: Secondary | ICD-10-CM | POA: Diagnosis not present

## 2019-01-02 DIAGNOSIS — Z85820 Personal history of malignant melanoma of skin: Secondary | ICD-10-CM | POA: Diagnosis not present

## 2019-01-02 DIAGNOSIS — L814 Other melanin hyperpigmentation: Secondary | ICD-10-CM | POA: Diagnosis not present

## 2019-01-02 DIAGNOSIS — D229 Melanocytic nevi, unspecified: Secondary | ICD-10-CM | POA: Diagnosis not present

## 2019-01-02 DIAGNOSIS — L821 Other seborrheic keratosis: Secondary | ICD-10-CM | POA: Diagnosis not present

## 2019-01-04 ENCOUNTER — Telehealth (HOSPITAL_COMMUNITY): Payer: Self-pay | Admitting: Cardiology

## 2019-01-04 DIAGNOSIS — E119 Type 2 diabetes mellitus without complications: Secondary | ICD-10-CM | POA: Diagnosis not present

## 2019-01-04 DIAGNOSIS — H25813 Combined forms of age-related cataract, bilateral: Secondary | ICD-10-CM | POA: Diagnosis not present

## 2019-01-04 DIAGNOSIS — H52221 Regular astigmatism, right eye: Secondary | ICD-10-CM | POA: Diagnosis not present

## 2019-01-04 DIAGNOSIS — H5213 Myopia, bilateral: Secondary | ICD-10-CM | POA: Diagnosis not present

## 2019-01-04 NOTE — Telephone Encounter (Signed)

## 2019-01-04 NOTE — Telephone Encounter (Signed)
Patient sent a MyChart message asking about samples of Eliquis. Two week supply of Eliquis left in the sample drawer in the front office. Replied to myChart message to inform the patient that his samples of Eliquis are ready for him to pick - up.  Patient also asking about starting nabumetone for his arthritis, will send a message to Dr. Stanford Breed for his approval.

## 2019-01-05 ENCOUNTER — Other Ambulatory Visit: Payer: Self-pay

## 2019-01-05 ENCOUNTER — Telehealth: Payer: Self-pay | Admitting: *Deleted

## 2019-01-05 ENCOUNTER — Ambulatory Visit (HOSPITAL_COMMUNITY)
Admission: RE | Admit: 2019-01-05 | Discharge: 2019-01-05 | Disposition: A | Discharge status: Home / Self Care | Payer: Medicare Other | Source: Ambulatory Visit | Attending: Cardiovascular Disease | Admitting: Cardiovascular Disease

## 2019-01-05 ENCOUNTER — Ambulatory Visit (HOSPITAL_BASED_OUTPATIENT_CLINIC_OR_DEPARTMENT_OTHER): Payer: Medicare Other

## 2019-01-05 DIAGNOSIS — I714 Abdominal aortic aneurysm, without rupture, unspecified: Secondary | ICD-10-CM

## 2019-01-05 DIAGNOSIS — I480 Paroxysmal atrial fibrillation: Secondary | ICD-10-CM

## 2019-01-05 DIAGNOSIS — I723 Aneurysm of iliac artery: Secondary | ICD-10-CM | POA: Insufficient documentation

## 2019-01-05 NOTE — Telephone Encounter (Signed)
The patient called in to see if he could have his AAA duplex completed today, the same day as his ECHO, since he drives from [place]. It had been rescheduled for 01/12/2019. He has been advised that they were unable to get him in today.

## 2019-01-10 ENCOUNTER — Other Ambulatory Visit: Payer: Self-pay | Admitting: *Deleted

## 2019-01-10 MED ORDER — APIXABAN 5 MG PO TABS: 5.0000 mg | ORAL_TABLET | Freq: Two times a day (BID) | ORAL | 0 refills | Status: DC

## 2019-01-11 DIAGNOSIS — L905 Scar conditions and fibrosis of skin: Secondary | ICD-10-CM | POA: Diagnosis not present

## 2019-01-11 DIAGNOSIS — L570 Actinic keratosis: Secondary | ICD-10-CM | POA: Diagnosis not present

## 2019-01-11 DIAGNOSIS — D0362 Melanoma in situ of left upper limb, including shoulder: Secondary | ICD-10-CM | POA: Diagnosis not present

## 2019-01-12 ENCOUNTER — Ambulatory Visit (HOSPITAL_COMMUNITY)
Admission: RE | Admit: 2019-01-12 | Discharge: 2019-01-12 | Disposition: A | Discharge status: Home / Self Care | Payer: Medicare Other | Source: Ambulatory Visit | Attending: Cardiovascular Disease | Admitting: Cardiovascular Disease

## 2019-01-12 ENCOUNTER — Other Ambulatory Visit: Payer: Self-pay

## 2019-01-12 DIAGNOSIS — I714 Abdominal aortic aneurysm, without rupture: Secondary | ICD-10-CM | POA: Diagnosis not present

## 2019-01-12 DIAGNOSIS — I723 Aneurysm of iliac artery: Secondary | ICD-10-CM

## 2019-01-15 ENCOUNTER — Other Ambulatory Visit: Payer: Self-pay | Admitting: *Deleted

## 2019-01-15 DIAGNOSIS — I714 Abdominal aortic aneurysm, without rupture, unspecified: Secondary | ICD-10-CM

## 2019-02-14 DIAGNOSIS — E119 Type 2 diabetes mellitus without complications: Secondary | ICD-10-CM | POA: Diagnosis not present

## 2019-02-14 DIAGNOSIS — H2513 Age-related nuclear cataract, bilateral: Secondary | ICD-10-CM | POA: Diagnosis not present

## 2019-02-14 DIAGNOSIS — H2511 Age-related nuclear cataract, right eye: Secondary | ICD-10-CM | POA: Diagnosis not present

## 2019-02-20 DIAGNOSIS — I482 Chronic atrial fibrillation, unspecified: Secondary | ICD-10-CM | POA: Diagnosis not present

## 2019-02-20 DIAGNOSIS — I340 Nonrheumatic mitral (valve) insufficiency: Secondary | ICD-10-CM | POA: Diagnosis not present

## 2019-02-20 DIAGNOSIS — R6520 Severe sepsis without septic shock: Secondary | ICD-10-CM | POA: Diagnosis not present

## 2019-02-20 DIAGNOSIS — Z794 Long term (current) use of insulin: Secondary | ICD-10-CM | POA: Diagnosis not present

## 2019-02-20 DIAGNOSIS — J9600 Acute respiratory failure, unspecified whether with hypoxia or hypercapnia: Secondary | ICD-10-CM | POA: Diagnosis not present

## 2019-02-20 DIAGNOSIS — K859 Acute pancreatitis without necrosis or infection, unspecified: Secondary | ICD-10-CM | POA: Diagnosis not present

## 2019-02-20 DIAGNOSIS — E876 Hypokalemia: Secondary | ICD-10-CM | POA: Diagnosis not present

## 2019-02-20 DIAGNOSIS — J9691 Respiratory failure, unspecified with hypoxia: Secondary | ICD-10-CM | POA: Diagnosis not present

## 2019-02-20 DIAGNOSIS — J150 Pneumonia due to Klebsiella pneumoniae: Secondary | ICD-10-CM | POA: Diagnosis not present

## 2019-02-20 DIAGNOSIS — I480 Paroxysmal atrial fibrillation: Secondary | ICD-10-CM | POA: Diagnosis not present

## 2019-02-20 DIAGNOSIS — R633 Feeding difficulties: Secondary | ICD-10-CM | POA: Diagnosis not present

## 2019-02-20 DIAGNOSIS — Z7901 Long term (current) use of anticoagulants: Secondary | ICD-10-CM | POA: Diagnosis not present

## 2019-02-20 DIAGNOSIS — R7989 Other specified abnormal findings of blood chemistry: Secondary | ICD-10-CM | POA: Diagnosis not present

## 2019-02-20 DIAGNOSIS — I351 Nonrheumatic aortic (valve) insufficiency: Secondary | ICD-10-CM | POA: Diagnosis not present

## 2019-02-20 DIAGNOSIS — R748 Abnormal levels of other serum enzymes: Secondary | ICD-10-CM | POA: Diagnosis not present

## 2019-02-20 DIAGNOSIS — Z9911 Dependence on respirator [ventilator] status: Secondary | ICD-10-CM | POA: Diagnosis not present

## 2019-02-20 DIAGNOSIS — K219 Gastro-esophageal reflux disease without esophagitis: Secondary | ICD-10-CM | POA: Diagnosis not present

## 2019-02-20 DIAGNOSIS — R160 Hepatomegaly, not elsewhere classified: Secondary | ICD-10-CM | POA: Diagnosis not present

## 2019-02-20 DIAGNOSIS — R188 Other ascites: Secondary | ICD-10-CM | POA: Diagnosis not present

## 2019-02-20 DIAGNOSIS — E119 Type 2 diabetes mellitus without complications: Secondary | ICD-10-CM | POA: Diagnosis not present

## 2019-02-20 DIAGNOSIS — Z452 Encounter for adjustment and management of vascular access device: Secondary | ICD-10-CM | POA: Diagnosis not present

## 2019-02-20 DIAGNOSIS — J90 Pleural effusion, not elsewhere classified: Secondary | ICD-10-CM | POA: Diagnosis not present

## 2019-02-20 DIAGNOSIS — I4891 Unspecified atrial fibrillation: Secondary | ICD-10-CM | POA: Diagnosis not present

## 2019-02-20 DIAGNOSIS — K8020 Calculus of gallbladder without cholecystitis without obstruction: Secondary | ICD-10-CM | POA: Diagnosis not present

## 2019-02-20 DIAGNOSIS — N170 Acute kidney failure with tubular necrosis: Secondary | ICD-10-CM | POA: Diagnosis not present

## 2019-02-20 DIAGNOSIS — K8591 Acute pancreatitis with uninfected necrosis, unspecified: Secondary | ICD-10-CM | POA: Diagnosis not present

## 2019-02-20 DIAGNOSIS — R935 Abnormal findings on diagnostic imaging of other abdominal regions, including retroperitoneum: Secondary | ICD-10-CM | POA: Diagnosis not present

## 2019-02-20 DIAGNOSIS — R509 Fever, unspecified: Secondary | ICD-10-CM | POA: Diagnosis not present

## 2019-02-20 DIAGNOSIS — G9341 Metabolic encephalopathy: Secondary | ICD-10-CM | POA: Diagnosis not present

## 2019-02-20 DIAGNOSIS — J9621 Acute and chronic respiratory failure with hypoxia: Secondary | ICD-10-CM | POA: Diagnosis not present

## 2019-02-20 DIAGNOSIS — R945 Abnormal results of liver function studies: Secondary | ICD-10-CM | POA: Diagnosis not present

## 2019-02-20 DIAGNOSIS — Z20828 Contact with and (suspected) exposure to other viral communicable diseases: Secondary | ICD-10-CM | POA: Diagnosis present

## 2019-02-20 DIAGNOSIS — J9811 Atelectasis: Secondary | ICD-10-CM | POA: Diagnosis not present

## 2019-02-20 DIAGNOSIS — I361 Nonrheumatic tricuspid (valve) insufficiency: Secondary | ICD-10-CM | POA: Diagnosis not present

## 2019-02-20 DIAGNOSIS — R109 Unspecified abdominal pain: Secondary | ICD-10-CM | POA: Diagnosis not present

## 2019-02-20 DIAGNOSIS — R0602 Shortness of breath: Secondary | ICD-10-CM | POA: Diagnosis not present

## 2019-02-20 DIAGNOSIS — J151 Pneumonia due to Pseudomonas: Secondary | ICD-10-CM | POA: Diagnosis not present

## 2019-02-20 DIAGNOSIS — G9340 Encephalopathy, unspecified: Secondary | ICD-10-CM | POA: Diagnosis not present

## 2019-02-20 DIAGNOSIS — J9601 Acute respiratory failure with hypoxia: Secondary | ICD-10-CM | POA: Diagnosis not present

## 2019-02-20 DIAGNOSIS — R918 Other nonspecific abnormal finding of lung field: Secondary | ICD-10-CM | POA: Diagnosis not present

## 2019-02-20 DIAGNOSIS — J181 Lobar pneumonia, unspecified organism: Secondary | ICD-10-CM | POA: Diagnosis not present

## 2019-02-20 DIAGNOSIS — R6521 Severe sepsis with septic shock: Secondary | ICD-10-CM | POA: Diagnosis not present

## 2019-02-20 DIAGNOSIS — I81 Portal vein thrombosis: Secondary | ICD-10-CM | POA: Diagnosis not present

## 2019-02-20 DIAGNOSIS — I10 Essential (primary) hypertension: Secondary | ICD-10-CM | POA: Diagnosis not present

## 2019-02-20 DIAGNOSIS — Z4682 Encounter for fitting and adjustment of non-vascular catheter: Secondary | ICD-10-CM | POA: Diagnosis not present

## 2019-02-20 DIAGNOSIS — B961 Klebsiella pneumoniae [K. pneumoniae] as the cause of diseases classified elsewhere: Secondary | ICD-10-CM | POA: Diagnosis not present

## 2019-02-20 DIAGNOSIS — J9690 Respiratory failure, unspecified, unspecified whether with hypoxia or hypercapnia: Secondary | ICD-10-CM | POA: Diagnosis not present

## 2019-02-20 DIAGNOSIS — A419 Sepsis, unspecified organism: Secondary | ICD-10-CM | POA: Diagnosis not present

## 2019-02-22 DIAGNOSIS — J9601 Acute respiratory failure with hypoxia: Secondary | ICD-10-CM | POA: Diagnosis not present

## 2019-02-24 DIAGNOSIS — J9690 Respiratory failure, unspecified, unspecified whether with hypoxia or hypercapnia: Secondary | ICD-10-CM | POA: Diagnosis not present

## 2019-02-24 DIAGNOSIS — B961 Klebsiella pneumoniae [K. pneumoniae] as the cause of diseases classified elsewhere: Secondary | ICD-10-CM | POA: Diagnosis not present

## 2019-02-24 DIAGNOSIS — J90 Pleural effusion, not elsewhere classified: Secondary | ICD-10-CM | POA: Diagnosis not present

## 2019-02-24 DIAGNOSIS — K859 Acute pancreatitis without necrosis or infection, unspecified: Secondary | ICD-10-CM | POA: Diagnosis not present

## 2019-02-24 DIAGNOSIS — J9601 Acute respiratory failure with hypoxia: Secondary | ICD-10-CM | POA: Diagnosis not present

## 2019-02-25 DIAGNOSIS — B961 Klebsiella pneumoniae [K. pneumoniae] as the cause of diseases classified elsewhere: Secondary | ICD-10-CM | POA: Diagnosis not present

## 2019-02-26 DIAGNOSIS — J9601 Acute respiratory failure with hypoxia: Secondary | ICD-10-CM | POA: Diagnosis not present

## 2019-02-27 DIAGNOSIS — G9340 Encephalopathy, unspecified: Secondary | ICD-10-CM | POA: Diagnosis not present

## 2019-02-27 DIAGNOSIS — J9690 Respiratory failure, unspecified, unspecified whether with hypoxia or hypercapnia: Secondary | ICD-10-CM | POA: Diagnosis not present

## 2019-03-02 DIAGNOSIS — R188 Other ascites: Secondary | ICD-10-CM | POA: Diagnosis not present

## 2019-03-05 ENCOUNTER — Telehealth: Payer: Self-pay | Admitting: Cardiology

## 2019-03-05 DIAGNOSIS — E119 Type 2 diabetes mellitus without complications: Secondary | ICD-10-CM | POA: Diagnosis not present

## 2019-03-05 DIAGNOSIS — Z452 Encounter for adjustment and management of vascular access device: Secondary | ICD-10-CM | POA: Diagnosis not present

## 2019-03-05 DIAGNOSIS — R6521 Severe sepsis with septic shock: Secondary | ICD-10-CM | POA: Diagnosis not present

## 2019-03-05 NOTE — Telephone Encounter (Signed)
Received call from Dr. Berneice Gandy, hospitalist at [facility]. Patient is current admitted for acute pancreatitis and is intubated for respiratory failure. He has had difficult to control atrial fibrillation. Dr. Berneice Gandy asking for additional history information. Trying amiodarone. Was off of apixaban while critically ill, restarted lovenox for portal vein thrombosis. Debating cardioversion.  Reviewed his available history in our chart. Defer to their protocol, but as patient has been off AC for possibly 4-5 days I discussed that we would do TEE prior to cardioversion unless very unstable.   I will alert Dr. Stanford Breed as Juluis Rainier re: patient's current hospitalization. Dr. Gevena Barre number included in message if additional contact needed.  Buford Dresser, MD, PhD [facility]  [address] [place], [place] [address] [phone]

## 2019-03-08 DIAGNOSIS — R6521 Severe sepsis with septic shock: Secondary | ICD-10-CM | POA: Diagnosis not present

## 2019-03-13 ENCOUNTER — Telehealth: Payer: Self-pay | Admitting: Cardiology

## 2019-03-13 NOTE — Telephone Encounter (Signed)
New Message   Patients wife is calling because her spouse is in the hospital in [place]. She states that he has been in the hospital for 3 weeks. They have him on a trach, they had to do a cardioversion and they want to put him a long term acute care hospital. She is wanting Dr. Stanford Breed opinion on where her husband can go to in [place] for long term care. Please call to discuss.

## 2019-03-13 NOTE — Telephone Encounter (Signed)
I dont know best long term care place in [place]; can we give them a list of options [facility]

## 2019-03-14 NOTE — Telephone Encounter (Signed)
Spoke with pt wife, Aware of dr crenshaw's recommendations.  

## 2019-03-14 NOTE — Telephone Encounter (Signed)
Follow up    Patient is following up on what Dr. Stanford Breed recommends. She is really want his opinion.

## 2019-03-16 ENCOUNTER — Inpatient Hospital Stay
Admission: AD | Admit: 2019-03-16 | Discharge: 2019-04-11 | Disposition: A | Discharge status: Rehabilitation Facility | Payer: Medicare Other | Source: Other Acute Inpatient Hospital | Attending: Internal Medicine | Admitting: Internal Medicine

## 2019-03-16 ENCOUNTER — Other Ambulatory Visit (HOSPITAL_COMMUNITY): Payer: Self-pay

## 2019-03-16 DIAGNOSIS — K859 Acute pancreatitis without necrosis or infection, unspecified: Secondary | ICD-10-CM | POA: Diagnosis not present

## 2019-03-16 DIAGNOSIS — A419 Sepsis, unspecified organism: Secondary | ICD-10-CM | POA: Diagnosis not present

## 2019-03-16 DIAGNOSIS — K8591 Acute pancreatitis with uninfected necrosis, unspecified: Secondary | ICD-10-CM | POA: Diagnosis not present

## 2019-03-16 DIAGNOSIS — Z9911 Dependence on respirator [ventilator] status: Secondary | ICD-10-CM | POA: Diagnosis not present

## 2019-03-16 DIAGNOSIS — I4821 Permanent atrial fibrillation: Secondary | ICD-10-CM | POA: Diagnosis not present

## 2019-03-16 DIAGNOSIS — Z803 Family history of malignant neoplasm of breast: Secondary | ICD-10-CM | POA: Diagnosis not present

## 2019-03-16 DIAGNOSIS — R531 Weakness: Secondary | ICD-10-CM | POA: Diagnosis present

## 2019-03-16 DIAGNOSIS — M25462 Effusion, left knee: Secondary | ICD-10-CM | POA: Diagnosis not present

## 2019-03-16 DIAGNOSIS — Z881 Allergy status to other antibiotic agents status: Secondary | ICD-10-CM | POA: Diagnosis not present

## 2019-03-16 DIAGNOSIS — Z430 Encounter for attention to tracheostomy: Secondary | ICD-10-CM | POA: Diagnosis not present

## 2019-03-16 DIAGNOSIS — I4892 Unspecified atrial flutter: Secondary | ICD-10-CM | POA: Diagnosis not present

## 2019-03-16 DIAGNOSIS — I714 Abdominal aortic aneurysm, without rupture: Secondary | ICD-10-CM | POA: Diagnosis present

## 2019-03-16 DIAGNOSIS — R319 Hematuria, unspecified: Secondary | ICD-10-CM | POA: Diagnosis not present

## 2019-03-16 DIAGNOSIS — K219 Gastro-esophageal reflux disease without esophagitis: Secondary | ICD-10-CM | POA: Diagnosis present

## 2019-03-16 DIAGNOSIS — J9621 Acute and chronic respiratory failure with hypoxia: Secondary | ICD-10-CM | POA: Diagnosis present

## 2019-03-16 DIAGNOSIS — J189 Pneumonia, unspecified organism: Secondary | ICD-10-CM

## 2019-03-16 DIAGNOSIS — Z431 Encounter for attention to gastrostomy: Secondary | ICD-10-CM | POA: Diagnosis not present

## 2019-03-16 DIAGNOSIS — R918 Other nonspecific abnormal finding of lung field: Secondary | ICD-10-CM | POA: Diagnosis not present

## 2019-03-16 DIAGNOSIS — Z6830 Body mass index (BMI) 30.0-30.9, adult: Secondary | ICD-10-CM | POA: Diagnosis not present

## 2019-03-16 DIAGNOSIS — R6520 Severe sepsis without septic shock: Secondary | ICD-10-CM | POA: Diagnosis not present

## 2019-03-16 DIAGNOSIS — I4891 Unspecified atrial fibrillation: Secondary | ICD-10-CM | POA: Diagnosis not present

## 2019-03-16 DIAGNOSIS — B961 Klebsiella pneumoniae [K. pneumoniae] as the cause of diseases classified elsewhere: Secondary | ICD-10-CM | POA: Diagnosis present

## 2019-03-16 DIAGNOSIS — J151 Pneumonia due to Pseudomonas: Secondary | ICD-10-CM | POA: Diagnosis present

## 2019-03-16 DIAGNOSIS — Z931 Gastrostomy status: Secondary | ICD-10-CM

## 2019-03-16 DIAGNOSIS — E46 Unspecified protein-calorie malnutrition: Secondary | ICD-10-CM | POA: Diagnosis present

## 2019-03-16 DIAGNOSIS — Z794 Long term (current) use of insulin: Secondary | ICD-10-CM | POA: Diagnosis not present

## 2019-03-16 DIAGNOSIS — J811 Chronic pulmonary edema: Secondary | ICD-10-CM | POA: Diagnosis not present

## 2019-03-16 DIAGNOSIS — N401 Enlarged prostate with lower urinary tract symptoms: Secondary | ICD-10-CM | POA: Diagnosis present

## 2019-03-16 DIAGNOSIS — Z888 Allergy status to other drugs, medicaments and biological substances status: Secondary | ICD-10-CM | POA: Diagnosis not present

## 2019-03-16 DIAGNOSIS — Z880 Allergy status to penicillin: Secondary | ICD-10-CM | POA: Diagnosis not present

## 2019-03-16 DIAGNOSIS — Z978 Presence of other specified devices: Secondary | ICD-10-CM | POA: Diagnosis not present

## 2019-03-16 DIAGNOSIS — E876 Hypokalemia: Secondary | ICD-10-CM | POA: Diagnosis present

## 2019-03-16 DIAGNOSIS — I480 Paroxysmal atrial fibrillation: Secondary | ICD-10-CM | POA: Diagnosis not present

## 2019-03-16 DIAGNOSIS — J9690 Respiratory failure, unspecified, unspecified whether with hypoxia or hypercapnia: Secondary | ICD-10-CM | POA: Diagnosis not present

## 2019-03-16 DIAGNOSIS — I482 Chronic atrial fibrillation, unspecified: Secondary | ICD-10-CM | POA: Diagnosis present

## 2019-03-16 DIAGNOSIS — I10 Essential (primary) hypertension: Secondary | ICD-10-CM | POA: Diagnosis not present

## 2019-03-16 DIAGNOSIS — I81 Portal vein thrombosis: Secondary | ICD-10-CM | POA: Diagnosis not present

## 2019-03-16 DIAGNOSIS — Z79890 Hormone replacement therapy (postmenopausal): Secondary | ICD-10-CM | POA: Diagnosis not present

## 2019-03-16 DIAGNOSIS — Z85820 Personal history of malignant melanoma of skin: Secondary | ICD-10-CM | POA: Diagnosis not present

## 2019-03-16 DIAGNOSIS — I483 Typical atrial flutter: Secondary | ICD-10-CM | POA: Diagnosis not present

## 2019-03-16 DIAGNOSIS — D638 Anemia in other chronic diseases classified elsewhere: Secondary | ICD-10-CM | POA: Diagnosis present

## 2019-03-16 DIAGNOSIS — R339 Retention of urine, unspecified: Secondary | ICD-10-CM | POA: Diagnosis not present

## 2019-03-16 DIAGNOSIS — R310 Gross hematuria: Secondary | ICD-10-CM | POA: Diagnosis not present

## 2019-03-16 DIAGNOSIS — I517 Cardiomegaly: Secondary | ICD-10-CM | POA: Diagnosis not present

## 2019-03-16 DIAGNOSIS — G9341 Metabolic encephalopathy: Secondary | ICD-10-CM | POA: Diagnosis present

## 2019-03-16 DIAGNOSIS — R7309 Other abnormal glucose: Secondary | ICD-10-CM | POA: Diagnosis not present

## 2019-03-16 DIAGNOSIS — R1310 Dysphagia, unspecified: Secondary | ICD-10-CM | POA: Diagnosis present

## 2019-03-16 DIAGNOSIS — E119 Type 2 diabetes mellitus without complications: Secondary | ICD-10-CM | POA: Diagnosis not present

## 2019-03-16 DIAGNOSIS — Z87891 Personal history of nicotine dependence: Secondary | ICD-10-CM | POA: Diagnosis not present

## 2019-03-16 DIAGNOSIS — R338 Other retention of urine: Secondary | ICD-10-CM | POA: Diagnosis present

## 2019-03-16 DIAGNOSIS — R5381 Other malaise: Secondary | ICD-10-CM | POA: Diagnosis not present

## 2019-03-16 DIAGNOSIS — R7881 Bacteremia: Secondary | ICD-10-CM | POA: Diagnosis present

## 2019-03-16 DIAGNOSIS — R0989 Other specified symptoms and signs involving the circulatory and respiratory systems: Secondary | ICD-10-CM | POA: Diagnosis not present

## 2019-03-16 DIAGNOSIS — Z930 Tracheostomy status: Secondary | ICD-10-CM | POA: Diagnosis not present

## 2019-03-16 DIAGNOSIS — Z6831 Body mass index (BMI) 31.0-31.9, adult: Secondary | ICD-10-CM | POA: Diagnosis not present

## 2019-03-16 DIAGNOSIS — Z7984 Long term (current) use of oral hypoglycemic drugs: Secondary | ICD-10-CM | POA: Diagnosis not present

## 2019-03-16 DIAGNOSIS — E039 Hypothyroidism, unspecified: Secondary | ICD-10-CM | POA: Diagnosis present

## 2019-03-16 DIAGNOSIS — Z882 Allergy status to sulfonamides status: Secondary | ICD-10-CM | POA: Diagnosis not present

## 2019-03-16 DIAGNOSIS — Z7901 Long term (current) use of anticoagulants: Secondary | ICD-10-CM | POA: Diagnosis not present

## 2019-03-16 DIAGNOSIS — E785 Hyperlipidemia, unspecified: Secondary | ICD-10-CM | POA: Diagnosis not present

## 2019-03-16 DIAGNOSIS — I4811 Longstanding persistent atrial fibrillation: Secondary | ICD-10-CM | POA: Diagnosis not present

## 2019-03-16 DIAGNOSIS — M109 Gout, unspecified: Secondary | ICD-10-CM | POA: Diagnosis not present

## 2019-03-16 HISTORY — DX: Metabolic encephalopathy: G93.41

## 2019-03-16 HISTORY — DX: Sepsis, unspecified organism: A41.9

## 2019-03-16 HISTORY — DX: Severe sepsis without septic shock: R65.20

## 2019-03-16 HISTORY — DX: Pneumonia due to Pseudomonas: J15.1

## 2019-03-16 HISTORY — DX: Chronic atrial fibrillation, unspecified: I48.20

## 2019-03-16 HISTORY — DX: Acute and chronic respiratory failure with hypoxia: J96.21

## 2019-03-16 HISTORY — DX: Acute pancreatitis with uninfected necrosis, unspecified: K85.91

## 2019-03-16 HISTORY — DX: Malignant melanoma of skin, unspecified: C43.9

## 2019-03-17 DIAGNOSIS — J9690 Respiratory failure, unspecified, unspecified whether with hypoxia or hypercapnia: Secondary | ICD-10-CM | POA: Diagnosis not present

## 2019-03-17 DIAGNOSIS — I482 Chronic atrial fibrillation, unspecified: Secondary | ICD-10-CM | POA: Diagnosis not present

## 2019-03-17 DIAGNOSIS — R7881 Bacteremia: Secondary | ICD-10-CM | POA: Diagnosis not present

## 2019-03-17 DIAGNOSIS — J151 Pneumonia due to Pseudomonas: Secondary | ICD-10-CM | POA: Diagnosis not present

## 2019-03-17 LAB — CBC WITH DIFFERENTIAL/PLATELET
Abs Immature Granulocytes: 0.1 10*3/uL — ABNORMAL HIGH (ref 0.00–0.07)
Basophils Absolute: 0 10*3/uL (ref 0.0–0.1)
Basophils Relative: 0 %
Eosinophils Absolute: 0.3 10*3/uL (ref 0.0–0.5)
Eosinophils Relative: 5 %
HCT: 34.4 % — ABNORMAL LOW (ref 39.0–52.0)
Hemoglobin: 10.4 g/dL — ABNORMAL LOW (ref 13.0–17.0)
Immature Granulocytes: 2 %
Lymphocytes Relative: 24 %
Lymphs Abs: 1.4 10*3/uL (ref 0.7–4.0)
MCH: 30.3 pg (ref 26.0–34.0)
MCHC: 30.2 g/dL (ref 30.0–36.0)
MCV: 100.3 fL — ABNORMAL HIGH (ref 80.0–100.0)
Monocytes Absolute: 0.5 10*3/uL (ref 0.1–1.0)
Monocytes Relative: 8 %
Neutro Abs: 3.7 10*3/uL (ref 1.7–7.7)
Neutrophils Relative %: 61 %
Platelets: 208 10*3/uL (ref 150–400)
RBC: 3.43 MIL/uL — ABNORMAL LOW (ref 4.22–5.81)
RDW: 17.1 % — ABNORMAL HIGH (ref 11.5–15.5)
WBC: 6 10*3/uL (ref 4.0–10.5)
nRBC: 0 % (ref 0.0–0.2)

## 2019-03-17 LAB — COMPREHENSIVE METABOLIC PANEL
ALT: 15 U/L (ref 0–44)
AST: 20 U/L (ref 15–41)
Albumin: 2.5 g/dL — ABNORMAL LOW (ref 3.5–5.0)
Alkaline Phosphatase: 83 U/L (ref 38–126)
Anion gap: 11 (ref 5–15)
BUN: 30 mg/dL — ABNORMAL HIGH (ref 8–23)
CO2: 29 mmol/L (ref 22–32)
Calcium: 9.3 mg/dL (ref 8.9–10.3)
Chloride: 106 mmol/L (ref 98–111)
Creatinine, Ser: 0.85 mg/dL (ref 0.61–1.24)
GFR calc Af Amer: >60 mL/min (ref >60)
GFR calc non Af Amer: >60 mL/min (ref >60)
Glucose, Bld: 140 mg/dL — ABNORMAL HIGH (ref 70–99)
Potassium: 3.2 mmol/L — ABNORMAL LOW (ref 3.5–5.1)
Sodium: 146 mmol/L — ABNORMAL HIGH (ref 135–145)
Total Bilirubin: 0.8 mg/dL (ref 0.3–1.2)
Total Protein: 7 g/dL (ref 6.5–8.1)

## 2019-03-17 LAB — HEMOGLOBIN A1C
Hgb A1c MFr Bld: 5.7 % — ABNORMAL HIGH (ref 4.8–5.6)
Mean Plasma Glucose: 116.89 mg/dL

## 2019-03-17 LAB — POTASSIUM: Potassium: 3.5 mmol/L (ref 3.5–5.1)

## 2019-03-17 LAB — T4, FREE: Free T4: 1.09 ng/dL (ref 0.61–1.12)

## 2019-03-18 DIAGNOSIS — R7881 Bacteremia: Secondary | ICD-10-CM | POA: Diagnosis not present

## 2019-03-18 DIAGNOSIS — J9690 Respiratory failure, unspecified, unspecified whether with hypoxia or hypercapnia: Secondary | ICD-10-CM | POA: Diagnosis not present

## 2019-03-18 DIAGNOSIS — I482 Chronic atrial fibrillation, unspecified: Secondary | ICD-10-CM | POA: Diagnosis not present

## 2019-03-18 DIAGNOSIS — J151 Pneumonia due to Pseudomonas: Secondary | ICD-10-CM | POA: Diagnosis not present

## 2019-03-18 LAB — CBC
HCT: 31.6 % — ABNORMAL LOW (ref 39.0–52.0)
Hemoglobin: 9.8 g/dL — ABNORMAL LOW (ref 13.0–17.0)
MCH: 30.4 pg (ref 26.0–34.0)
MCHC: 31 g/dL (ref 30.0–36.0)
MCV: 98.1 fL (ref 80.0–100.0)
Platelets: 177 10*3/uL (ref 150–400)
RBC: 3.22 MIL/uL — ABNORMAL LOW (ref 4.22–5.81)
RDW: 17 % — ABNORMAL HIGH (ref 11.5–15.5)
WBC: 5.5 10*3/uL (ref 4.0–10.5)
nRBC: 0 % (ref 0.0–0.2)

## 2019-03-18 LAB — TSH: TSH: 4.686 u[IU]/mL — ABNORMAL HIGH (ref 0.350–4.500)

## 2019-03-18 LAB — BASIC METABOLIC PANEL
Anion gap: 9 (ref 5–15)
BUN: 24 mg/dL — ABNORMAL HIGH (ref 8–23)
CO2: 31 mmol/L (ref 22–32)
Calcium: 9 mg/dL (ref 8.9–10.3)
Chloride: 105 mmol/L (ref 98–111)
Creatinine, Ser: 0.89 mg/dL (ref 0.61–1.24)
GFR calc Af Amer: >60 mL/min (ref >60)
GFR calc non Af Amer: >60 mL/min (ref >60)
Glucose, Bld: 165 mg/dL — ABNORMAL HIGH (ref 70–99)
Potassium: 3.2 mmol/L — ABNORMAL LOW (ref 3.5–5.1)
Sodium: 145 mmol/L (ref 135–145)

## 2019-03-18 LAB — C DIFFICILE QUICK SCREEN W PCR REFLEX
C Diff antigen: NEGATIVE
C Diff interpretation: NOT DETECTED
C Diff toxin: NEGATIVE

## 2019-03-18 LAB — T4, FREE: Free T4: 1.11 ng/dL (ref 0.61–1.12)

## 2019-03-18 LAB — POTASSIUM: Potassium: 3.4 mmol/L — ABNORMAL LOW (ref 3.5–5.1)

## 2019-03-19 ENCOUNTER — Encounter: Payer: Self-pay | Admitting: Internal Medicine

## 2019-03-19 DIAGNOSIS — I482 Chronic atrial fibrillation, unspecified: Secondary | ICD-10-CM

## 2019-03-19 DIAGNOSIS — J151 Pneumonia due to Pseudomonas: Secondary | ICD-10-CM | POA: Diagnosis not present

## 2019-03-19 DIAGNOSIS — K8591 Acute pancreatitis with uninfected necrosis, unspecified: Secondary | ICD-10-CM

## 2019-03-19 DIAGNOSIS — R6520 Severe sepsis without septic shock: Secondary | ICD-10-CM

## 2019-03-19 DIAGNOSIS — K859 Acute pancreatitis without necrosis or infection, unspecified: Secondary | ICD-10-CM | POA: Diagnosis not present

## 2019-03-19 DIAGNOSIS — J9621 Acute and chronic respiratory failure with hypoxia: Secondary | ICD-10-CM

## 2019-03-19 DIAGNOSIS — I480 Paroxysmal atrial fibrillation: Secondary | ICD-10-CM

## 2019-03-19 DIAGNOSIS — G9341 Metabolic encephalopathy: Secondary | ICD-10-CM | POA: Diagnosis not present

## 2019-03-19 DIAGNOSIS — A419 Sepsis, unspecified organism: Secondary | ICD-10-CM | POA: Diagnosis not present

## 2019-03-19 DIAGNOSIS — Z9911 Dependence on respirator [ventilator] status: Secondary | ICD-10-CM | POA: Diagnosis not present

## 2019-03-19 DIAGNOSIS — J9690 Respiratory failure, unspecified, unspecified whether with hypoxia or hypercapnia: Secondary | ICD-10-CM | POA: Diagnosis not present

## 2019-03-19 NOTE — Consult Note (Signed)
Pulmonary Critical Care Medicine [place]  PULMONARY SERVICE  Date of Service: 03/19/2019  PULMONARY CRITICAL CARE CONSULT   [name]  OMV:[id number]  DOB: Jan 21, 1951   DOA: 03/16/2019  Referring Physician: Merton Border, MD  HPI: Andrew Compton is a 68 y.o. male seen for follow up of Acute on Chronic Respiratory Failure.  Patient has multiple medical problems including presence of hypertension type 2 diabetes chronic atrial fibrillation on Eliquis and gastroesophageal reflux who presents to the hospital because of abdominal pain and nausea.  Patient underwent evaluation was found to have acute pancreatitis and started on conservative management as well as antibiotics.  Apparently the patient deteriorated with increased work of breathing and ended up having to be intubated subsequently became hypotensive and required Levophed.  Apparently felt that the patient had ARDS.  Subsequently was not able to come off the ventilator eventually ended up with a trach and then transferred to our facility for further management.  Review of Systems:  ROS performed and is unremarkable other than noted above.  Past Medical History:  Diagnosis Date  . Essential hypertension, benign   . GERD (gastroesophageal reflux disease)   . Gouty arthropathy, unspecified   . Hypothyroidism   . Other and unspecified hyperlipidemia   . Paroxysmal atrial fibrillation [facility])     Past Surgical History:  Procedure Laterality Date  . ESOPHAGEAL DILATION  01/15/2016   Procedure: ESOPHAGEAL DILATION;  Surgeon: Rogene Houston, MD;  [facility];  Service: Endoscopy;;  . ESOPHAGOGASTRODUODENOSCOPY N/A 01/15/2016   Procedure: ESOPHAGOGASTRODUODENOSCOPY (EGD);  Surgeon: Rogene Houston, MD;  [facility];  Service: Endoscopy;  Laterality: N/A;  1030  . FRACTURE SURGERY Left    wrist, multiple surgeries  . NEUROLYSIS MEDIAN NERVE Right 11/27/2013   Neurolysis Brachial  Plexus  . NO PAST SURGERIES    . SHOULDER SURGERY Right     Social History:    reports that he quit smoking about 22 years ago. He has never used smokeless tobacco. He reports current alcohol use. He reports that he does not use drugs.  Family History: Non-Contributory to the present illness  Allergies  Allergen Reactions  . Azithromycin Hives  . Penicillins Other (See Comments)    Reaction: unknown , allergy since child per patient report  . Levophed [Norepinephrine Bitartrate]   . Sulfa Antibiotics     Medications: Reviewed on Rounds  Physical Exam:  Vitals: Pitcher 97.4 pulse 114 respiratory 24 blood pressure 172/64 saturations 99%  Ventilator Settings mode ventilation pressure support FiO2 40% pressure support 12 PEEP 5  . General: Comfortable at this time . Eyes: Grossly normal lids, irises & conjunctiva . ENT: grossly tongue is normal . Neck: no obvious mass . Cardiovascular: S1-S2 normal no gallop or rub . Respiratory: No rhonchi no rales are noted . Abdomen: Soft and nontender . Skin: no rash seen on limited exam . Musculoskeletal: not rigid . Psychiatric:unable to assess . Neurologic: no seizure no involuntary movements         Labs on Admission:  Basic Metabolic Panel: Recent Labs  Lab 03/17/19 0715 03/17/19 1350 03/18/19 0553 03/18/19 2254  NA 146*  --  145  --   K 3.2* 3.5 3.2* 3.4*  CL 106  --  105  --   CO2 29  --  31  --   GLUCOSE 140*  --  165*  --   BUN 30*  --  24*  --  CREATININE 0.85  --  0.89  --   CALCIUM 9.3  --  9.0  --     No results for input(s): PHART, PCO2ART, PO2ART, HCO3, O2SAT in the last 168 hours.  Liver Function Tests: Recent Labs  Lab 03/17/19 0715  AST 20  ALT 15  ALKPHOS 83  BILITOT 0.8  PROT 7.0  ALBUMIN 2.5*   No results for input(s): LIPASE, AMYLASE in the last 168 hours. No results for input(s): AMMONIA in the last 168 hours.  CBC: Recent Labs  Lab 03/17/19 0715 03/18/19 0553  WBC 6.0 5.5   NEUTROABS 3.7  --   HGB 10.4* 9.8*  HCT 34.4* 31.6*  MCV 100.3* 98.1  PLT 208 177    Cardiac Enzymes: No results for input(s): CKTOTAL, CKMB, CKMBINDEX, TROPONINI in the last 168 hours.  BNP (last 3 results) No results for input(s): BNP in the last 8760 hours.  ProBNP (last 3 results) No results for input(s): PROBNP in the last 8760 hours.   Radiological Exams on Admission: Dg Abdomen Peg Tube Location  Result Date: 03/16/2019 CLINICAL DATA:  Peg tube placement. EXAM: ABDOMEN - 1 VIEW COMPARISON:  None. FINDINGS: Contrast is present within stomach suggesting appropriate positioning of the PEG tube. Visualized bowel gas pattern is nonobstructive IMPRESSION: Contrast present within the stomach suggesting appropriate positioning of the PEG tube. Electronically Signed   By: Franki Cabot M.D.   On: 03/16/2019 18:33   Dg Chest Port 1 View  Result Date: 03/16/2019 CLINICAL DATA:  Pneumonia. EXAM: PORTABLE CHEST 1 VIEW COMPARISON:  None. FINDINGS: Tracheostomy tube in good position with the tip at the thoracic inlet. Right internal jugular central venous catheter with the tip at the cavoatrial junction. The heart size and mediastinal contours are within normal limits. Normal pulmonary vascularity. Low lung volumes with left lower lobe retrocardiac opacity. Possible small left pleural effusion. No pneumothorax. No acute osseous abnormality. IMPRESSION: 1. Left lower lobe pneumonia with possible small left pleural effusion. Electronically Signed   By: Titus Dubin M.D.   On: 03/16/2019 18:39    Assessment/Plan Active Problems:   Acute on chronic respiratory failure with hypoxia (HCC)   Paroxysmal atrial fibrillation (HCC)   Pneumonia due to Pseudomonas aeruginosa (HCC)   Chronic atrial fibrillation   Acute pancreatitis with uninfected necrosis, unspecified   Metabolic encephalopathy   Severe sepsis ([place])   1. Acute on chronic respiratory failure with hypoxia patient is on wean  protocol currently on pressure support mode the plan is to continue to advance to as tolerated today for 8 hours. 2. Pneumonia secondary to Pseudomonas has been treated patient has been completing the antibiotics with meropenem.  Chest x-ray results as noted above with a small pleural effusion and pneumonitis 3. Chronic atrial fibrillation rate controlled we will continue with supportive care right now is on amiodarone and Cardizem 4. Acute pancreatitis we will continue to monitor labs 5. Metabolic encephalopathy unchanged we will continue with supportive care 6. Severe sepsis related to gram-negative bacteremia treated improving we will continue with supportive care patient has completed antibiotics  I have personally seen and evaluated the patient, evaluated laboratory and imaging results, formulated the assessment and plan and placed orders. The Patient requires high complexity decision making for assessment and support.  Case was discussed on Rounds with the Respiratory Therapy Staff Time Spent 55minutes  Allyne Gee, MD [facility] Pulmonary Critical Care Medicine Sleep Medicine

## 2019-03-20 ENCOUNTER — Other Ambulatory Visit (HOSPITAL_COMMUNITY): Payer: Self-pay

## 2019-03-20 DIAGNOSIS — K8591 Acute pancreatitis with uninfected necrosis, unspecified: Secondary | ICD-10-CM | POA: Diagnosis not present

## 2019-03-20 DIAGNOSIS — Z978 Presence of other specified devices: Secondary | ICD-10-CM | POA: Diagnosis not present

## 2019-03-20 DIAGNOSIS — R918 Other nonspecific abnormal finding of lung field: Secondary | ICD-10-CM | POA: Diagnosis not present

## 2019-03-20 DIAGNOSIS — A419 Sepsis, unspecified organism: Secondary | ICD-10-CM | POA: Diagnosis not present

## 2019-03-20 DIAGNOSIS — J811 Chronic pulmonary edema: Secondary | ICD-10-CM | POA: Diagnosis not present

## 2019-03-20 DIAGNOSIS — Z930 Tracheostomy status: Secondary | ICD-10-CM | POA: Diagnosis not present

## 2019-03-20 DIAGNOSIS — Z9911 Dependence on respirator [ventilator] status: Secondary | ICD-10-CM | POA: Diagnosis not present

## 2019-03-20 DIAGNOSIS — J151 Pneumonia due to Pseudomonas: Secondary | ICD-10-CM | POA: Diagnosis not present

## 2019-03-20 DIAGNOSIS — G9341 Metabolic encephalopathy: Secondary | ICD-10-CM | POA: Diagnosis not present

## 2019-03-20 DIAGNOSIS — R7881 Bacteremia: Secondary | ICD-10-CM | POA: Diagnosis not present

## 2019-03-20 DIAGNOSIS — I517 Cardiomegaly: Secondary | ICD-10-CM | POA: Diagnosis not present

## 2019-03-20 DIAGNOSIS — R6520 Severe sepsis without septic shock: Secondary | ICD-10-CM | POA: Diagnosis not present

## 2019-03-20 DIAGNOSIS — I480 Paroxysmal atrial fibrillation: Secondary | ICD-10-CM | POA: Diagnosis not present

## 2019-03-20 DIAGNOSIS — R0989 Other specified symptoms and signs involving the circulatory and respiratory systems: Secondary | ICD-10-CM | POA: Diagnosis not present

## 2019-03-20 DIAGNOSIS — J9690 Respiratory failure, unspecified, unspecified whether with hypoxia or hypercapnia: Secondary | ICD-10-CM | POA: Diagnosis not present

## 2019-03-20 DIAGNOSIS — J9621 Acute and chronic respiratory failure with hypoxia: Secondary | ICD-10-CM | POA: Diagnosis not present

## 2019-03-20 DIAGNOSIS — I482 Chronic atrial fibrillation, unspecified: Secondary | ICD-10-CM | POA: Diagnosis not present

## 2019-03-20 LAB — POTASSIUM: Potassium: 3.2 mmol/L — ABNORMAL LOW (ref 3.5–5.1)

## 2019-03-20 NOTE — Progress Notes (Addendum)
Pulmonary Critical Care Medicine [place]   PULMONARY CRITICAL CARE SERVICE  PROGRESS NOTE  Date of Service: 03/20/2019  Andrew Compton  QMG:[id number]  DOB: 1950-10-27   DOA: 03/16/2019  Referring Physician: Merton Border, MD  HPI: Andrew Compton is a 68 y.o. male seen for follow up of Acute on Chronic Respiratory Failure.  Patient has a 12-hour goal today on pressure support 12/5 and FiO2 of 35%.  Currently satting well with no distress.  Medications: Reviewed on Rounds  Physical Exam:  Vitals: Pulse 120 respirations 20 BP 148/90 O2 sat 99% temp 98.1  Ventilator Settings pressure support 12/5 FiO2 35%  . General: Comfortable at this time . Eyes: Grossly normal lids, irises & conjunctiva . ENT: grossly tongue is normal . Neck: no obvious mass . Cardiovascular: S1 S2 normal no gallop . Respiratory: No rales or rhonchi noted . Abdomen: soft . Skin: no rash seen on limited exam . Musculoskeletal: not rigid . Psychiatric:unable to assess . Neurologic: no seizure no involuntary movements         Lab Data:   Basic Metabolic Panel: Recent Labs  Lab 03/17/19 0715 03/17/19 1350 03/18/19 0553 03/18/19 2254 03/20/19 1413  NA 146*  --  145  --   --   K 3.2* 3.5 3.2* 3.4* 3.2*  CL 106  --  105  --   --   CO2 29  --  31  --   --   GLUCOSE 140*  --  165*  --   --   BUN 30*  --  24*  --   --   CREATININE 0.85  --  0.89  --   --   CALCIUM 9.3  --  9.0  --   --     ABG: No results for input(s): PHART, PCO2ART, PO2ART, HCO3, O2SAT in the last 168 hours.  Liver Function Tests: Recent Labs  Lab 03/17/19 0715  AST 20  ALT 15  ALKPHOS 83  BILITOT 0.8  PROT 7.0  ALBUMIN 2.5*   No results for input(s): LIPASE, AMYLASE in the last 168 hours. No results for input(s): AMMONIA in the last 168 hours.  CBC: Recent Labs  Lab 03/17/19 0715 03/18/19 0553  WBC 6.0 5.5  NEUTROABS 3.7  --   HGB 10.4* 9.8*  HCT 34.4* 31.6*  MCV 100.3* 98.1  PLT  208 177    Cardiac Enzymes: No results for input(s): CKTOTAL, CKMB, CKMBINDEX, TROPONINI in the last 168 hours.  BNP (last 3 results) No results for input(s): BNP in the last 8760 hours.  ProBNP (last 3 results) No results for input(s): PROBNP in the last 8760 hours.  Radiological Exams: Dg Chest Port 1 View  Result Date: 03/20/2019 CLINICAL DATA:  Pneumonia EXAM: PORTABLE CHEST 1 VIEW COMPARISON:  03/16/2019 FINDINGS: Tracheostomy and right internal jugular central line remain in place, unchanged. Cardiomegaly vascular congestion. Increasing perihilar and lower lobe opacities concerning for edema. No effusions or acute bony abnormality. IMPRESSION: Increasing perihilar and lower lobe opacities concerning for pulmonary edema. Electronically Signed   By: Rolm Baptise M.D.   On: 03/20/2019 12:09    Assessment/Plan Active Problems:   Acute on chronic respiratory failure with hypoxia (HCC)   Paroxysmal atrial fibrillation (HCC)   Pneumonia due to Pseudomonas aeruginosa (HCC)   Chronic atrial fibrillation   Acute pancreatitis with uninfected necrosis, unspecified   Metabolic encephalopathy   Severe sepsis ([place])   1. Acute on chronic respiratory failure with hypoxia  patient is on wean protocol currently on pressure support mode the plan is to continue to advance to as tolerated today for 12 hours. 2. Pneumonia secondary to Pseudomonas has been treated patient has been completing the antibiotics with meropenem.  Chest x-ray results as noted above with a small pleural effusion and pneumonitis 3. Chronic atrial fibrillation rate controlled we will continue with supportive care right now is on amiodarone and Cardizem 4. Acute pancreatitis we will continue to monitor labs 5. Metabolic encephalopathy unchanged we will continue with supportive care 6. Severe sepsis related to gram-negative bacteremia treated improving we will continue with supportive care patient has completed antibiotics   I  have personally seen and evaluated the patient, evaluated laboratory and imaging results, formulated the assessment and plan and placed orders. The Patient requires high complexity decision making for assessment and support.  Case was discussed on Rounds with the Respiratory Therapy Staff  Allyne Gee, MD [facility] Pulmonary Critical Care Medicine Sleep Medicine

## 2019-03-21 DIAGNOSIS — J9690 Respiratory failure, unspecified, unspecified whether with hypoxia or hypercapnia: Secondary | ICD-10-CM | POA: Diagnosis not present

## 2019-03-21 DIAGNOSIS — J9621 Acute and chronic respiratory failure with hypoxia: Secondary | ICD-10-CM | POA: Diagnosis not present

## 2019-03-21 DIAGNOSIS — K8591 Acute pancreatitis with uninfected necrosis, unspecified: Secondary | ICD-10-CM | POA: Diagnosis not present

## 2019-03-21 DIAGNOSIS — I480 Paroxysmal atrial fibrillation: Secondary | ICD-10-CM | POA: Diagnosis not present

## 2019-03-21 DIAGNOSIS — G9341 Metabolic encephalopathy: Secondary | ICD-10-CM | POA: Diagnosis not present

## 2019-03-21 DIAGNOSIS — Z9911 Dependence on respirator [ventilator] status: Secondary | ICD-10-CM | POA: Diagnosis not present

## 2019-03-21 DIAGNOSIS — K859 Acute pancreatitis without necrosis or infection, unspecified: Secondary | ICD-10-CM | POA: Diagnosis not present

## 2019-03-21 DIAGNOSIS — R6520 Severe sepsis without septic shock: Secondary | ICD-10-CM | POA: Diagnosis not present

## 2019-03-21 DIAGNOSIS — J151 Pneumonia due to Pseudomonas: Secondary | ICD-10-CM | POA: Diagnosis not present

## 2019-03-21 DIAGNOSIS — I482 Chronic atrial fibrillation, unspecified: Secondary | ICD-10-CM | POA: Diagnosis not present

## 2019-03-21 DIAGNOSIS — A419 Sepsis, unspecified organism: Secondary | ICD-10-CM | POA: Diagnosis not present

## 2019-03-21 LAB — BASIC METABOLIC PANEL
Anion gap: 10 (ref 5–15)
BUN: 18 mg/dL (ref 8–23)
CO2: 28 mmol/L (ref 22–32)
Calcium: 8.7 mg/dL — ABNORMAL LOW (ref 8.9–10.3)
Chloride: 106 mmol/L (ref 98–111)
Creatinine, Ser: 0.74 mg/dL (ref 0.61–1.24)
GFR calc Af Amer: >60 mL/min (ref >60)
GFR calc non Af Amer: >60 mL/min (ref >60)
Glucose, Bld: 134 mg/dL — ABNORMAL HIGH (ref 70–99)
Potassium: 3.4 mmol/L — ABNORMAL LOW (ref 3.5–5.1)
Sodium: 144 mmol/L (ref 135–145)

## 2019-03-21 LAB — CBC
HCT: 33.1 % — ABNORMAL LOW (ref 39.0–52.0)
Hemoglobin: 9.9 g/dL — ABNORMAL LOW (ref 13.0–17.0)
MCH: 29.8 pg (ref 26.0–34.0)
MCHC: 29.9 g/dL — ABNORMAL LOW (ref 30.0–36.0)
MCV: 99.7 fL (ref 80.0–100.0)
Platelets: 109 10*3/uL — ABNORMAL LOW (ref 150–400)
RBC: 3.32 MIL/uL — ABNORMAL LOW (ref 4.22–5.81)
RDW: 17 % — ABNORMAL HIGH (ref 11.5–15.5)
WBC: 7 10*3/uL (ref 4.0–10.5)
nRBC: 0 % (ref 0.0–0.2)

## 2019-03-21 LAB — MAGNESIUM: Magnesium: 1.8 mg/dL (ref 1.7–2.4)

## 2019-03-21 NOTE — Progress Notes (Addendum)
Pulmonary Critical Care Medicine [place]   PULMONARY CRITICAL CARE SERVICE  PROGRESS NOTE  Date of Service: 03/21/2019  Andrew Compton  QQI:[id number]  DOB: 07/19/1951   DOA: 03/16/2019  Referring Physician: Merton Border, MD  HPI: Andrew Compton is a 68 y.o. male seen for follow up of Acute on Chronic Respiratory Failure.  Patient remains on pressure support 12/5 with FiO2 35%.  Satting well at this time has been tachycardic in the 140s today.  Medications: Reviewed on Rounds  Physical Exam:  Vitals: Pulse 109 respirations 27 BP 155/85 O2 sat 98% 98.8  Ventilator Settings pressure support 12/5 and FiO2 of 35%  . General: Comfortable at this time . Eyes: Grossly normal lids, irises & conjunctiva . ENT: grossly tongue is normal . Neck: no obvious mass . Cardiovascular: S1 S2 normal no gallop . Respiratory: No rales or rhonchi noted . Abdomen: soft . Skin: no rash seen on limited exam . Musculoskeletal: not rigid . Psychiatric:unable to assess . Neurologic: no seizure no involuntary movements         Lab Data:   Basic Metabolic Panel: Recent Labs  Lab 03/17/19 0715 03/17/19 1350 03/18/19 0553 03/18/19 2254 03/20/19 1413 03/21/19 0609  NA 146*  --  145  --   --  144  K 3.2* 3.5 3.2* 3.4* 3.2* 3.4*  CL 106  --  105  --   --  106  CO2 29  --  31  --   --  28  GLUCOSE 140*  --  165*  --   --  134*  BUN 30*  --  24*  --   --  18  CREATININE 0.85  --  0.89  --   --  0.74  CALCIUM 9.3  --  9.0  --   --  8.7*  MG  --   --   --   --   --  1.8    ABG: No results for input(s): PHART, PCO2ART, PO2ART, HCO3, O2SAT in the last 168 hours.  Liver Function Tests: Recent Labs  Lab 03/17/19 0715  AST 20  ALT 15  ALKPHOS 83  BILITOT 0.8  PROT 7.0  ALBUMIN 2.5*   No results for input(s): LIPASE, AMYLASE in the last 168 hours. No results for input(s): AMMONIA in the last 168 hours.  CBC: Recent Labs  Lab 03/17/19 0715 03/18/19 0553  03/21/19 0609  WBC 6.0 5.5 7.0  NEUTROABS 3.7  --   --   HGB 10.4* 9.8* 9.9*  HCT 34.4* 31.6* 33.1*  MCV 100.3* 98.1 99.7  PLT 208 177 109*    Cardiac Enzymes: No results for input(s): CKTOTAL, CKMB, CKMBINDEX, TROPONINI in the last 168 hours.  BNP (last 3 results) No results for input(s): BNP in the last 8760 hours.  ProBNP (last 3 results) No results for input(s): PROBNP in the last 8760 hours.  Radiological Exams: Dg Chest Port 1 View  Result Date: 03/20/2019 CLINICAL DATA:  Pneumonia EXAM: PORTABLE CHEST 1 VIEW COMPARISON:  03/16/2019 FINDINGS: Tracheostomy and right internal jugular central line remain in place, unchanged. Cardiomegaly vascular congestion. Increasing perihilar and lower lobe opacities concerning for edema. No effusions or acute bony abnormality. IMPRESSION: Increasing perihilar and lower lobe opacities concerning for pulmonary edema. Electronically Signed   By: Rolm Baptise M.D.   On: 03/20/2019 12:09    Assessment/Plan Active Problems:   Acute on chronic respiratory failure with hypoxia (HCC)   Paroxysmal atrial fibrillation ([place])  Pneumonia due to Pseudomonas aeruginosa ([place])   Chronic atrial fibrillation   Acute pancreatitis with uninfected necrosis, unspecified   Metabolic encephalopathy   Severe sepsis ([place])   1. Acute on chronic respiratory failure with hypoxia patient is on wean protocol currently on pressure support mode the plan is to continue to advance to as tolerated.   2. Pneumonia secondary to Pseudomonas has been treated patient has been completing the antibiotics with meropenem. Chest x-ray results as noted above with a small pleural effusion and pneumonitis 3. Chronic atrial fibrillation rate controlled we will continue with supportive care right now is on amiodarone and Cardizem 4. Acute pancreatitis we will continue to monitor labs 5. Metabolic encephalopathy unchanged we will continue with supportive care 6. Severe sepsis related to  gram-negative bacteremia treated improving we will continue with supportive care patient has completed antibiotics   I have personally seen and evaluated the patient, evaluated laboratory and imaging results, formulated the assessment and plan and placed orders. The Patient requires high complexity decision making for assessment and support.  Case was discussed on Rounds with the Respiratory Therapy Staff  Allyne Gee, MD [facility] Pulmonary Critical Care Medicine Sleep Medicine

## 2019-03-22 DIAGNOSIS — A419 Sepsis, unspecified organism: Secondary | ICD-10-CM | POA: Diagnosis not present

## 2019-03-22 DIAGNOSIS — G9341 Metabolic encephalopathy: Secondary | ICD-10-CM | POA: Diagnosis not present

## 2019-03-22 DIAGNOSIS — K859 Acute pancreatitis without necrosis or infection, unspecified: Secondary | ICD-10-CM | POA: Diagnosis not present

## 2019-03-22 DIAGNOSIS — J9621 Acute and chronic respiratory failure with hypoxia: Secondary | ICD-10-CM | POA: Diagnosis not present

## 2019-03-22 DIAGNOSIS — K8591 Acute pancreatitis with uninfected necrosis, unspecified: Secondary | ICD-10-CM | POA: Diagnosis not present

## 2019-03-22 DIAGNOSIS — I482 Chronic atrial fibrillation, unspecified: Secondary | ICD-10-CM | POA: Diagnosis not present

## 2019-03-22 DIAGNOSIS — R6520 Severe sepsis without septic shock: Secondary | ICD-10-CM | POA: Diagnosis not present

## 2019-03-22 DIAGNOSIS — J9690 Respiratory failure, unspecified, unspecified whether with hypoxia or hypercapnia: Secondary | ICD-10-CM | POA: Diagnosis not present

## 2019-03-22 DIAGNOSIS — Z9911 Dependence on respirator [ventilator] status: Secondary | ICD-10-CM | POA: Diagnosis not present

## 2019-03-22 DIAGNOSIS — J151 Pneumonia due to Pseudomonas: Secondary | ICD-10-CM | POA: Diagnosis not present

## 2019-03-22 DIAGNOSIS — I480 Paroxysmal atrial fibrillation: Secondary | ICD-10-CM | POA: Diagnosis not present

## 2019-03-22 NOTE — Progress Notes (Signed)
Pulmonary Critical Care Medicine [place]   PULMONARY CRITICAL CARE SERVICE  PROGRESS NOTE  Date of Service: 03/22/2019  PEDER ALLUMS  TFT:[id number]  DOB: 11-Apr-1951   DOA: 03/16/2019  Referring Physician: Merton Border, MD  HPI: Andrew Compton is a 68 y.o. male seen for follow up of Acute on Chronic Respiratory Failure.  Patient is on full support will going to try for pressure support today  Medications: Reviewed on Rounds  Physical Exam:  Vitals: Temperature 98.7 pulse 126 respiratory 18 blood pressure 130/70 saturations 95%  Ventilator Settings mode ventilation assist control FiO2 35% tidal volume 613 PEEP 5  . General: Comfortable at this time . Eyes: Grossly normal lids, irises & conjunctiva . ENT: grossly tongue is normal . Neck: no obvious mass . Cardiovascular: S1 S2 normal no gallop . Respiratory: Scattered distant rhonchi . Abdomen: soft . Skin: no rash seen on limited exam . Musculoskeletal: not rigid . Psychiatric:unable to assess . Neurologic: no seizure no involuntary movements         Lab Data:   Basic Metabolic Panel: Recent Labs  Lab 03/17/19 0715 03/17/19 1350 03/18/19 0553 03/18/19 2254 03/20/19 1413 03/21/19 0609  NA 146*  --  145  --   --  144  K 3.2* 3.5 3.2* 3.4* 3.2* 3.4*  CL 106  --  105  --   --  106  CO2 29  --  31  --   --  28  GLUCOSE 140*  --  165*  --   --  134*  BUN 30*  --  24*  --   --  18  CREATININE 0.85  --  0.89  --   --  0.74  CALCIUM 9.3  --  9.0  --   --  8.7*  MG  --   --   --   --   --  1.8    ABG: No results for input(s): PHART, PCO2ART, PO2ART, HCO3, O2SAT in the last 168 hours.  Liver Function Tests: Recent Labs  Lab 03/17/19 0715  AST 20  ALT 15  ALKPHOS 83  BILITOT 0.8  PROT 7.0  ALBUMIN 2.5*   No results for input(s): LIPASE, AMYLASE in the last 168 hours. No results for input(s): AMMONIA in the last 168 hours.  CBC: Recent Labs  Lab 03/17/19 0715 03/18/19 0553  03/21/19 0609  WBC 6.0 5.5 7.0  NEUTROABS 3.7  --   --   HGB 10.4* 9.8* 9.9*  HCT 34.4* 31.6* 33.1*  MCV 100.3* 98.1 99.7  PLT 208 177 109*    Cardiac Enzymes: No results for input(s): CKTOTAL, CKMB, CKMBINDEX, TROPONINI in the last 168 hours.  BNP (last 3 results) No results for input(s): BNP in the last 8760 hours.  ProBNP (last 3 results) No results for input(s): PROBNP in the last 8760 hours.  Radiological Exams: Dg Chest Port 1 View  Result Date: 03/20/2019 CLINICAL DATA:  Pneumonia EXAM: PORTABLE CHEST 1 VIEW COMPARISON:  03/16/2019 FINDINGS: Tracheostomy and right internal jugular central line remain in place, unchanged. Cardiomegaly vascular congestion. Increasing perihilar and lower lobe opacities concerning for edema. No effusions or acute bony abnormality. IMPRESSION: Increasing perihilar and lower lobe opacities concerning for pulmonary edema. Electronically Signed   By: Rolm Baptise M.D.   On: 03/20/2019 12:09    Assessment/Plan Active Problems:   Acute on chronic respiratory failure with hypoxia (HCC)   Paroxysmal atrial fibrillation (HCC)   Pneumonia due to Pseudomonas  aeruginosa ([place])   Chronic atrial fibrillation   Acute pancreatitis with uninfected necrosis, unspecified   Metabolic encephalopathy   Severe sepsis ([place])   1. Acute on chronic respiratory failure with hypoxia patient is going to be attempted on pressure support wean today we will continue to advance as tolerated 2. Paroxysmal atrial fibrillation rate is controlled 3. Pseudomonas pneumonia treated we will continue to follow 4. Chronic atrial fibrillation rate controlled 5. Acute pancreatitis grossly unchanged we will continue to follow along 6. Metabolic encephalopathy grossly unchanged 7. Severe sepsis hemodynamics are stable we will continue to follow   I have personally seen and evaluated the patient, evaluated laboratory and imaging results, formulated the assessment and plan and placed  orders. The Patient requires high complexity decision making for assessment and support.  Case was discussed on Rounds with the Respiratory Therapy Staff  Allyne Gee, MD [facility] Pulmonary Critical Care Medicine Sleep Medicine

## 2019-03-23 DIAGNOSIS — K859 Acute pancreatitis without necrosis or infection, unspecified: Secondary | ICD-10-CM | POA: Diagnosis not present

## 2019-03-23 DIAGNOSIS — G9341 Metabolic encephalopathy: Secondary | ICD-10-CM | POA: Diagnosis not present

## 2019-03-23 DIAGNOSIS — I480 Paroxysmal atrial fibrillation: Secondary | ICD-10-CM | POA: Diagnosis not present

## 2019-03-23 DIAGNOSIS — J151 Pneumonia due to Pseudomonas: Secondary | ICD-10-CM | POA: Diagnosis not present

## 2019-03-23 DIAGNOSIS — K8591 Acute pancreatitis with uninfected necrosis, unspecified: Secondary | ICD-10-CM | POA: Diagnosis not present

## 2019-03-23 DIAGNOSIS — R6520 Severe sepsis without septic shock: Secondary | ICD-10-CM | POA: Diagnosis not present

## 2019-03-23 DIAGNOSIS — I482 Chronic atrial fibrillation, unspecified: Secondary | ICD-10-CM | POA: Diagnosis not present

## 2019-03-23 DIAGNOSIS — Z9911 Dependence on respirator [ventilator] status: Secondary | ICD-10-CM | POA: Diagnosis not present

## 2019-03-23 DIAGNOSIS — J9690 Respiratory failure, unspecified, unspecified whether with hypoxia or hypercapnia: Secondary | ICD-10-CM | POA: Diagnosis not present

## 2019-03-23 DIAGNOSIS — J9621 Acute and chronic respiratory failure with hypoxia: Secondary | ICD-10-CM | POA: Diagnosis not present

## 2019-03-23 DIAGNOSIS — A419 Sepsis, unspecified organism: Secondary | ICD-10-CM | POA: Diagnosis not present

## 2019-03-23 NOTE — Progress Notes (Addendum)
Pulmonary Critical Care Medicine [place]   PULMONARY CRITICAL CARE SERVICE  PROGRESS NOTE  Date of Service: 03/23/2019  Andrew Compton  [id number]  DOB: 10-04-1950   DOA: 03/16/2019  Referring Physician: Merton Border, MD  HPI: Andrew Compton is a 68 y.o. male seen for follow up of Acute on Chronic Respiratory Failure.  Patient continues on pressure support 12/5 and FiO2 35% for 20-hour goal today.  Medications: Reviewed on Rounds  Physical Exam:  Vitals: Pulse 92 respirations 23 BP 138/81 O2 sat 97% temp 97.6  Ventilator Settings pressure support 12/5 FiO2 of 35%.  . General: Comfortable at this time . Eyes: Grossly normal lids, irises & conjunctiva . ENT: grossly tongue is normal . Neck: no obvious mass . Cardiovascular: S1 S2 normal no gallop . Respiratory: No rales or rhonchi noted. . Abdomen: soft . Skin: no rash seen on limited exam . Musculoskeletal: not rigid . Psychiatric:unable to assess . Neurologic: no seizure no involuntary movements         Lab Data:   Basic Metabolic Panel: Recent Labs  Lab 03/17/19 0715 03/17/19 1350 03/18/19 0553 03/18/19 2254 03/20/19 1413 03/21/19 0609  NA 146*  --  145  --   --  144  K 3.2* 3.5 3.2* 3.4* 3.2* 3.4*  CL 106  --  105  --   --  106  CO2 29  --  31  --   --  28  GLUCOSE 140*  --  165*  --   --  134*  BUN 30*  --  24*  --   --  18  CREATININE 0.85  --  0.89  --   --  0.74  CALCIUM 9.3  --  9.0  --   --  8.7*  MG  --   --   --   --   --  1.8    ABG: No results for input(s): PHART, PCO2ART, PO2ART, HCO3, O2SAT in the last 168 hours.  Liver Function Tests: Recent Labs  Lab 03/17/19 0715  AST 20  ALT 15  ALKPHOS 83  BILITOT 0.8  PROT 7.0  ALBUMIN 2.5*   No results for input(s): LIPASE, AMYLASE in the last 168 hours. No results for input(s): AMMONIA in the last 168 hours.  CBC: Recent Labs  Lab 03/17/19 0715 03/18/19 0553 03/21/19 0609  WBC 6.0 5.5 7.0  NEUTROABS  3.7  --   --   HGB 10.4* 9.8* 9.9*  HCT 34.4* 31.6* 33.1*  MCV 100.3* 98.1 99.7  PLT 208 177 109*    Cardiac Enzymes: No results for input(s): CKTOTAL, CKMB, CKMBINDEX, TROPONINI in the last 168 hours.  BNP (last 3 results) No results for input(s): BNP in the last 8760 hours.  ProBNP (last 3 results) No results for input(s): PROBNP in the last 8760 hours.  Radiological Exams: No results found.  Assessment/Plan Active Problems:   Acute on chronic respiratory failure with hypoxia (HCC)   Paroxysmal atrial fibrillation (HCC)   Pneumonia due to Pseudomonas aeruginosa (HCC)   Chronic atrial fibrillation   Acute pancreatitis with uninfected necrosis, unspecified   Metabolic encephalopathy   Severe sepsis ([place])   1. Acute on chronic respiratory failure with hypoxia patient is going to be attempted on pressure support wean today we will continue to advance as tolerated 2. Paroxysmal atrial fibrillation rate is controlled 3. Pseudomonas pneumonia treated we will continue to follow 4. Chronic atrial fibrillation rate controlled 5. Acute pancreatitis grossly  unchanged we will continue to follow along 6. Metabolic encephalopathy grossly unchanged 7. Severe sepsis hemodynamics are stable we will continue to follow   I have personally seen and evaluated the patient, evaluated laboratory and imaging results, formulated the assessment and plan and placed orders. The Patient requires high complexity decision making for assessment and support.  Case was discussed on Rounds with the Respiratory Therapy Staff  Allyne Gee, MD [facility] Pulmonary Critical Care Medicine Sleep Medicine

## 2019-03-24 DIAGNOSIS — K859 Acute pancreatitis without necrosis or infection, unspecified: Secondary | ICD-10-CM | POA: Diagnosis not present

## 2019-03-24 DIAGNOSIS — Z9911 Dependence on respirator [ventilator] status: Secondary | ICD-10-CM | POA: Diagnosis not present

## 2019-03-24 DIAGNOSIS — J9690 Respiratory failure, unspecified, unspecified whether with hypoxia or hypercapnia: Secondary | ICD-10-CM | POA: Diagnosis not present

## 2019-03-24 DIAGNOSIS — R6520 Severe sepsis without septic shock: Secondary | ICD-10-CM | POA: Diagnosis not present

## 2019-03-24 DIAGNOSIS — A419 Sepsis, unspecified organism: Secondary | ICD-10-CM | POA: Diagnosis not present

## 2019-03-24 DIAGNOSIS — J9621 Acute and chronic respiratory failure with hypoxia: Secondary | ICD-10-CM | POA: Diagnosis not present

## 2019-03-24 DIAGNOSIS — I480 Paroxysmal atrial fibrillation: Secondary | ICD-10-CM | POA: Diagnosis not present

## 2019-03-24 DIAGNOSIS — J151 Pneumonia due to Pseudomonas: Secondary | ICD-10-CM | POA: Diagnosis not present

## 2019-03-24 DIAGNOSIS — G9341 Metabolic encephalopathy: Secondary | ICD-10-CM | POA: Diagnosis not present

## 2019-03-24 DIAGNOSIS — I482 Chronic atrial fibrillation, unspecified: Secondary | ICD-10-CM | POA: Diagnosis not present

## 2019-03-24 DIAGNOSIS — K8591 Acute pancreatitis with uninfected necrosis, unspecified: Secondary | ICD-10-CM | POA: Diagnosis not present

## 2019-03-24 LAB — POTASSIUM: Potassium: 3 mmol/L — ABNORMAL LOW (ref 3.5–5.1)

## 2019-03-24 NOTE — Progress Notes (Addendum)
Pulmonary Critical Care Medicine [place]   PULMONARY CRITICAL CARE SERVICE  PROGRESS NOTE  Date of Service: 03/24/2019  [name]  [id number]  DOB: 05/08/1951   DOA: 03/16/2019  Referring Physician: Merton Border, MD  HPI: Andrew Compton is a 68 y.o. male seen for follow up of Acute on Chronic Respiratory Failure.  Patient remains on pressure support 12/5 with an O2 of 35% for 16-hour goal today currently satting well with no distress.  Medications: Reviewed on Rounds  Physical Exam:  Vitals: Pulse 128 respirations 30 BP 129/88 O2 sat 96% temp 98.1  Ventilator Settings pressure 12/5 FiO2 35%  . General: Comfortable at this time . Eyes: Grossly normal lids, irises & conjunctiva . ENT: grossly tongue is normal . Neck: no obvious mass . Cardiovascular: S1 S2 normal no gallop . Respiratory: No rales or rhonchi noted . Abdomen: soft . Skin: no rash seen on limited exam . Musculoskeletal: not rigid . Psychiatric:unable to assess . Neurologic: no seizure no involuntary movements         Lab Data:   Basic Metabolic Panel: Recent Labs  Lab 03/18/19 0553 03/18/19 2254 03/20/19 1413 03/21/19 0609 03/24/19 1313  NA 145  --   --  144  --   K 3.2* 3.4* 3.2* 3.4* 3.0*  CL 105  --   --  106  --   CO2 31  --   --  28  --   GLUCOSE 165*  --   --  134*  --   BUN 24*  --   --  18  --   CREATININE 0.89  --   --  0.74  --   CALCIUM 9.0  --   --  8.7*  --   MG  --   --   --  1.8  --     ABG: No results for input(s): PHART, PCO2ART, PO2ART, HCO3, O2SAT in the last 168 hours.  Liver Function Tests: No results for input(s): AST, ALT, ALKPHOS, BILITOT, PROT, ALBUMIN in the last 168 hours. No results for input(s): LIPASE, AMYLASE in the last 168 hours. No results for input(s): AMMONIA in the last 168 hours.  CBC: Recent Labs  Lab 03/18/19 0553 03/21/19 0609  WBC 5.5 7.0  HGB 9.8* 9.9*  HCT 31.6* 33.1*  MCV 98.1 99.7  PLT 177 109*     Cardiac Enzymes: No results for input(s): CKTOTAL, CKMB, CKMBINDEX, TROPONINI in the last 168 hours.  BNP (last 3 results) No results for input(s): BNP in the last 8760 hours.  ProBNP (last 3 results) No results for input(s): PROBNP in the last 8760 hours.  Radiological Exams: No results found.  Assessment/Plan Active Problems:   Acute on chronic respiratory failure with hypoxia (HCC)   Paroxysmal atrial fibrillation (HCC)   Pneumonia due to Pseudomonas aeruginosa (HCC)   Chronic atrial fibrillation   Acute pancreatitis with uninfected necrosis, unspecified   Metabolic encephalopathy   Severe sepsis ([place])   1. Acute on chronic respiratory failure with hypoxia patient has a goal of 16 hours today on pressure support 12/5 FiO2 35%.  Continue aggressive control and supportive measures. 2. Paroxysmal atrial fibrillation rate is controlled 3. Pseudomonas pneumonia treated we will continue to follow 4. Chronic atrial fibrillation rate controlled 5. Acute pancreatitis grossly unchanged we will continue to follow along 6. Metabolic encephalopathy grossly unchanged 7. Severe sepsis hemodynamics are stable we will continue to follow   I have personally seen and evaluated  the patient, evaluated laboratory and imaging results, formulated the assessment and plan and placed orders. The Patient requires high complexity decision making for assessment and support.  Case was discussed on Rounds with the Respiratory Therapy Staff  Allyne Gee, MD [facility] Pulmonary Critical Care Medicine Sleep Medicine

## 2019-03-25 DIAGNOSIS — J9690 Respiratory failure, unspecified, unspecified whether with hypoxia or hypercapnia: Secondary | ICD-10-CM | POA: Diagnosis not present

## 2019-03-25 DIAGNOSIS — G9341 Metabolic encephalopathy: Secondary | ICD-10-CM | POA: Diagnosis not present

## 2019-03-25 DIAGNOSIS — I480 Paroxysmal atrial fibrillation: Secondary | ICD-10-CM | POA: Diagnosis not present

## 2019-03-25 DIAGNOSIS — J151 Pneumonia due to Pseudomonas: Secondary | ICD-10-CM | POA: Diagnosis not present

## 2019-03-25 DIAGNOSIS — Z9911 Dependence on respirator [ventilator] status: Secondary | ICD-10-CM | POA: Diagnosis not present

## 2019-03-25 DIAGNOSIS — I482 Chronic atrial fibrillation, unspecified: Secondary | ICD-10-CM | POA: Diagnosis not present

## 2019-03-25 DIAGNOSIS — K859 Acute pancreatitis without necrosis or infection, unspecified: Secondary | ICD-10-CM | POA: Diagnosis not present

## 2019-03-25 DIAGNOSIS — K8591 Acute pancreatitis with uninfected necrosis, unspecified: Secondary | ICD-10-CM | POA: Diagnosis not present

## 2019-03-25 DIAGNOSIS — J9621 Acute and chronic respiratory failure with hypoxia: Secondary | ICD-10-CM | POA: Diagnosis not present

## 2019-03-25 DIAGNOSIS — R6520 Severe sepsis without septic shock: Secondary | ICD-10-CM | POA: Diagnosis not present

## 2019-03-25 DIAGNOSIS — A419 Sepsis, unspecified organism: Secondary | ICD-10-CM | POA: Diagnosis not present

## 2019-03-25 LAB — BLOOD GAS, ARTERIAL
Acid-Base Excess: 7.9 mmol/L — ABNORMAL HIGH (ref 0.0–2.0)
Bicarbonate: 31.6 mmol/L — ABNORMAL HIGH (ref 20.0–28.0)
FIO2: 40
MECHVT: 500 mL
O2 Saturation: 92 %
PEEP: 5 cmH2O
Patient temperature: 98.6
RATE: 12 resp/min
pCO2 arterial: 41.8 mmHg (ref 32.0–48.0)
pH, Arterial: 7.49 — ABNORMAL HIGH (ref 7.350–7.450)
pO2, Arterial: 64.9 mmHg — ABNORMAL LOW (ref 83.0–108.0)

## 2019-03-25 LAB — POTASSIUM: Potassium: 3.8 mmol/L (ref 3.5–5.1)

## 2019-03-25 NOTE — Progress Notes (Addendum)
Pulmonary Critical Care Medicine [place]   PULMONARY CRITICAL CARE SERVICE  PROGRESS NOTE  Date of Service: 03/25/2019  MACLAREN ARROYAVE  [id number]  DOB: [birth year]   DOA: 03/16/2019  Referring Physician: Merton Border, MD  HPI: Andrew Compton is a 68 y.o. male seen for follow up of Acute on Chronic Respiratory Failure.  Patient remains on full support on the ventilator at this time assist-control rate of 12 FiO2 40%.  Medications: Reviewed on Rounds  Physical Exam:  Vitals: Pulse 132 respirations 34 BP 140/84 O2 sat 99% temp 97.9  Ventilator Settings ventilator mode AC VC plus rate of 12 tidal 500 PEEP of 5 FiO2 40%.  . General: Comfortable at this time . Eyes: Grossly normal lids, irises & conjunctiva . ENT: grossly tongue is normal . Neck: no obvious mass . Cardiovascular: S1 S2 normal no gallop . Respiratory: No rales or rhonchi noted . Abdomen: soft . Skin: no rash seen on limited exam . Musculoskeletal: not rigid . Psychiatric:unable to assess . Neurologic: no seizure no involuntary movements         Lab Data:   Basic Metabolic Panel: Recent Labs  Lab 03/18/19 2254 03/20/19 1413 03/21/19 0609 03/24/19 1313 03/25/19 1235  NA  --   --  144  --   --   K 3.4* 3.2* 3.4* 3.0* 3.8  CL  --   --  106  --   --   CO2  --   --  28  --   --   GLUCOSE  --   --  134*  --   --   BUN  --   --  18  --   --   CREATININE  --   --  0.74  --   --   CALCIUM  --   --  8.7*  --   --   MG  --   --  1.8  --   --     ABG: Recent Labs  Lab 03/25/19 1200  PHART 7.490*  PCO2ART 41.8  PO2ART 64.9*  HCO3 31.6*  O2SAT 92.0    Liver Function Tests: No results for input(s): AST, ALT, ALKPHOS, BILITOT, PROT, ALBUMIN in the last 168 hours. No results for input(s): LIPASE, AMYLASE in the last 168 hours. No results for input(s): AMMONIA in the last 168 hours.  CBC: Recent Labs  Lab 03/21/19 0609  WBC 7.0  HGB 9.9*  HCT 33.1*  MCV 99.7  PLT  109*    Cardiac Enzymes: No results for input(s): CKTOTAL, CKMB, CKMBINDEX, TROPONINI in the last 168 hours.  BNP (last 3 results) No results for input(s): BNP in the last 8760 hours.  ProBNP (last 3 results) No results for input(s): PROBNP in the last 8760 hours.  Radiological Exams: No results found.  Assessment/Plan Active Problems:   Acute on chronic respiratory failure with hypoxia (HCC)   Paroxysmal atrial fibrillation (HCC)   Pneumonia due to Pseudomonas aeruginosa (HCC)   Chronic atrial fibrillation   Acute pancreatitis with uninfected necrosis, unspecified   Metabolic encephalopathy   Severe sepsis ([place])   1. Acute on chronic respiratory failure with hypoxia  patient remains on assist control mode rate of 12 tidal volume 500 PEEP 5 and FiO2 of 40% continue supportive measures and pulmonary toilet. 2. Paroxysmal atrial fibrillation rate is controlled 3. Pseudomonas pneumonia treated we will continue to follow 4. Chronic atrial fibrillation rate controlled 5. Acute pancreatitis grossly unchanged we will continue  to follow along 6. Metabolic encephalopathy grossly unchanged 7. Severe sepsis hemodynamics are stable we will continue to follow   I have personally seen and evaluated the patient, evaluated laboratory and imaging results, formulated the assessment and plan and placed orders. The Patient requires high complexity decision making for assessment and support.  Case was discussed on Rounds with the Respiratory Therapy Staff  Allyne Gee, MD [facility] Pulmonary Critical Care Medicine Sleep Medicine

## 2019-03-26 DIAGNOSIS — A419 Sepsis, unspecified organism: Secondary | ICD-10-CM | POA: Diagnosis not present

## 2019-03-26 DIAGNOSIS — J151 Pneumonia due to Pseudomonas: Secondary | ICD-10-CM | POA: Diagnosis not present

## 2019-03-26 DIAGNOSIS — J9621 Acute and chronic respiratory failure with hypoxia: Secondary | ICD-10-CM | POA: Diagnosis not present

## 2019-03-26 DIAGNOSIS — J9690 Respiratory failure, unspecified, unspecified whether with hypoxia or hypercapnia: Secondary | ICD-10-CM | POA: Diagnosis not present

## 2019-03-26 DIAGNOSIS — Z9911 Dependence on respirator [ventilator] status: Secondary | ICD-10-CM | POA: Diagnosis not present

## 2019-03-26 DIAGNOSIS — I482 Chronic atrial fibrillation, unspecified: Secondary | ICD-10-CM | POA: Diagnosis not present

## 2019-03-26 DIAGNOSIS — R6520 Severe sepsis without septic shock: Secondary | ICD-10-CM | POA: Diagnosis not present

## 2019-03-26 DIAGNOSIS — K859 Acute pancreatitis without necrosis or infection, unspecified: Secondary | ICD-10-CM | POA: Diagnosis not present

## 2019-03-26 DIAGNOSIS — I480 Paroxysmal atrial fibrillation: Secondary | ICD-10-CM | POA: Diagnosis not present

## 2019-03-26 DIAGNOSIS — K8591 Acute pancreatitis with uninfected necrosis, unspecified: Secondary | ICD-10-CM | POA: Diagnosis not present

## 2019-03-26 DIAGNOSIS — G9341 Metabolic encephalopathy: Secondary | ICD-10-CM | POA: Diagnosis not present

## 2019-03-26 NOTE — Progress Notes (Signed)
Pulmonary Critical Care Medicine [place]   PULMONARY CRITICAL CARE SERVICE  PROGRESS NOTE  Date of Service: 03/26/2019  MARSH BEDOY  [id number]  DOB: Jun 06, 1951   DOA: 03/16/2019  Referring Physician: Merton Border, MD  HPI: Andrew Compton is a 68 y.o. male seen for follow up of Acute on Chronic Respiratory Failure.  Patient currently is on T collar is requiring 40% oxygen and the goal today is for approximately 2 hours off the ventilator  Medications: Reviewed on Rounds  Physical Exam:  Vitals: Temperature 97.4 pulse 76 respiratory rate 24 blood pressure 156/87 saturations 94%  Ventilator Settings off the ventilator on T collar currently on 40% FiO2  . General: Comfortable at this time . Eyes: Grossly normal lids, irises & conjunctiva . ENT: grossly tongue is normal . Neck: no obvious mass . Cardiovascular: S1 S2 normal no gallop . Respiratory: No rhonchi no rales are noted at this time . Abdomen: soft . Skin: no rash seen on limited exam . Musculoskeletal: not rigid . Psychiatric:unable to assess . Neurologic: no seizure no involuntary movements         Lab Data:   Basic Metabolic Panel: Recent Labs  Lab 03/20/19 1413 03/21/19 0609 03/24/19 1313 03/25/19 1235  NA  --  144  --   --   K 3.2* 3.4* 3.0* 3.8  CL  --  106  --   --   CO2  --  28  --   --   GLUCOSE  --  134*  --   --   BUN  --  18  --   --   CREATININE  --  0.74  --   --   CALCIUM  --  8.7*  --   --   MG  --  1.8  --   --     ABG: Recent Labs  Lab 03/25/19 1200  PHART 7.490*  PCO2ART 41.8  PO2ART 64.9*  HCO3 31.6*  O2SAT 92.0    Liver Function Tests: No results for input(s): AST, ALT, ALKPHOS, BILITOT, PROT, ALBUMIN in the last 168 hours. No results for input(s): LIPASE, AMYLASE in the last 168 hours. No results for input(s): AMMONIA in the last 168 hours.  CBC: Recent Labs  Lab 03/21/19 0609  WBC 7.0  HGB 9.9*  HCT 33.1*  MCV 99.7  PLT 109*     Cardiac Enzymes: No results for input(s): CKTOTAL, CKMB, CKMBINDEX, TROPONINI in the last 168 hours.  BNP (last 3 results) No results for input(s): BNP in the last 8760 hours.  ProBNP (last 3 results) No results for input(s): PROBNP in the last 8760 hours.  Radiological Exams: No results found.  Assessment/Plan Active Problems:   Acute on chronic respiratory failure with hypoxia (HCC)   Paroxysmal atrial fibrillation (HCC)   Pneumonia due to Pseudomonas aeruginosa (HCC)   Chronic atrial fibrillation   Acute pancreatitis with uninfected necrosis, unspecified   Metabolic encephalopathy   Severe sepsis ([place])   1. Acute on chronic respiratory failure with hypoxia we will continue with T collar trials titrate oxygen continue pulmonary toilet. 2. Paroxysmal atrial fibrillation rate controlled 3. Pseudomonas pneumonia treated 4. Chronic atrial fibrillation rate controlled 5. Metabolic encephalopathy 6. Pancreatitis slowly improving 7. Severe sepsis resolved   I have personally seen and evaluated the patient, evaluated laboratory and imaging results, formulated the assessment and plan and placed orders. The Patient requires high complexity decision making for assessment and support.  Case was discussed  on Rounds with the Respiratory Therapy Staff  Allyne Gee, MD [facility] Pulmonary Critical Care Medicine Sleep Medicine

## 2019-03-27 DIAGNOSIS — J9621 Acute and chronic respiratory failure with hypoxia: Secondary | ICD-10-CM | POA: Diagnosis not present

## 2019-03-27 DIAGNOSIS — R6520 Severe sepsis without septic shock: Secondary | ICD-10-CM | POA: Diagnosis not present

## 2019-03-27 DIAGNOSIS — K859 Acute pancreatitis without necrosis or infection, unspecified: Secondary | ICD-10-CM | POA: Diagnosis not present

## 2019-03-27 DIAGNOSIS — K8591 Acute pancreatitis with uninfected necrosis, unspecified: Secondary | ICD-10-CM | POA: Diagnosis not present

## 2019-03-27 DIAGNOSIS — I482 Chronic atrial fibrillation, unspecified: Secondary | ICD-10-CM | POA: Diagnosis not present

## 2019-03-27 DIAGNOSIS — J151 Pneumonia due to Pseudomonas: Secondary | ICD-10-CM | POA: Diagnosis not present

## 2019-03-27 DIAGNOSIS — G9341 Metabolic encephalopathy: Secondary | ICD-10-CM | POA: Diagnosis not present

## 2019-03-27 DIAGNOSIS — A419 Sepsis, unspecified organism: Secondary | ICD-10-CM | POA: Diagnosis not present

## 2019-03-27 DIAGNOSIS — J9690 Respiratory failure, unspecified, unspecified whether with hypoxia or hypercapnia: Secondary | ICD-10-CM | POA: Diagnosis not present

## 2019-03-27 NOTE — Progress Notes (Addendum)
Pulmonary Critical Care Medicine [place]   PULMONARY CRITICAL CARE SERVICE  PROGRESS NOTE  Date of Service: 03/27/2019  Andrew Compton  [id number]  DOB: 06/12/1951   DOA: 03/16/2019  Referring Physician: Merton Border, MD  HPI: Andrew Compton is a 68 y.o. male seen for follow up of Acute on Chronic Respiratory Failure.  Patient currently is the ventilator on pressure support mode failed an attempt at T collar  Medications: Reviewed on Rounds  Physical Exam:  Vitals: Temperature 98.7 pulse 81 respiratory 22 blood pressure 157/87 saturations 95%  Ventilator Settings mode ventilation pressure support FiO2 40% pressure support 12 PEEP 5  . General: Comfortable at this time . Eyes: Grossly normal lids, irises & conjunctiva . ENT: grossly tongue is normal . Neck: no obvious mass . Cardiovascular: S1 S2 normal no gallop . Respiratory: No rhonchi no rales are noted at this time . Abdomen: soft . Skin: no rash seen on limited exam . Musculoskeletal: not rigid . Psychiatric:unable to assess . Neurologic: no seizure no involuntary movements         Lab Data:   Basic Metabolic Panel: Recent Labs  Lab 03/21/19 0609 03/24/19 1313 03/25/19 1235  NA 144  --   --   K 3.4* 3.0* 3.8  CL 106  --   --   CO2 28  --   --   GLUCOSE 134*  --   --   BUN 18  --   --   CREATININE 0.74  --   --   CALCIUM 8.7*  --   --   MG 1.8  --   --     ABG: Recent Labs  Lab 03/25/19 1200  PHART 7.490*  PCO2ART 41.8  PO2ART 64.9*  HCO3 31.6*  O2SAT 92.0    Liver Function Tests: No results for input(s): AST, ALT, ALKPHOS, BILITOT, PROT, ALBUMIN in the last 168 hours. No results for input(s): LIPASE, AMYLASE in the last 168 hours. No results for input(s): AMMONIA in the last 168 hours.  CBC: Recent Labs  Lab 03/21/19 0609  WBC 7.0  HGB 9.9*  HCT 33.1*  MCV 99.7  PLT 109*    Cardiac Enzymes: No results for input(s): CKTOTAL, CKMB, CKMBINDEX,  TROPONINI in the last 168 hours.  BNP (last 3 results) No results for input(s): BNP in the last 8760 hours.  ProBNP (last 3 results) No results for input(s): PROBNP in the last 8760 hours.  Radiological Exams: No results found.  Assessment/Plan Active Problems:   Acute on chronic respiratory failure with hypoxia (HCC)   Pneumonia due to Pseudomonas aeruginosa (HCC)   Chronic atrial fibrillation   Acute pancreatitis with uninfected necrosis, unspecified   Metabolic encephalopathy   Severe sepsis ([place])   1. Acute on chronic respiratory failure with hypoxia we will continue with some pressure support mode recheck the RSB I and try to wean once again. 2. Paroxysmal atrial fibrillation rate controlled 3. Pseudomonas pneumonia treated 4. Chronic atrial fibrillation rate controlled 5. Acute pancreatitis at baseline we will continue with supportive care 6. Metabolic encephalopathy no change 7. Severe sepsis resolved   I have personally seen and evaluated the patient, evaluated laboratory and imaging results, formulated the assessment and plan and placed orders. The Patient requires high complexity decision making for assessment and support.  Case was discussed on Rounds with the Respiratory Therapy Staff  Allyne Gee, MD [facility] Pulmonary Critical Care Medicine Sleep Medicine

## 2019-03-28 ENCOUNTER — Other Ambulatory Visit (HOSPITAL_COMMUNITY): Payer: Self-pay

## 2019-03-28 DIAGNOSIS — A419 Sepsis, unspecified organism: Secondary | ICD-10-CM | POA: Diagnosis not present

## 2019-03-28 DIAGNOSIS — J151 Pneumonia due to Pseudomonas: Secondary | ICD-10-CM | POA: Diagnosis not present

## 2019-03-28 DIAGNOSIS — I482 Chronic atrial fibrillation, unspecified: Secondary | ICD-10-CM | POA: Diagnosis not present

## 2019-03-28 DIAGNOSIS — K859 Acute pancreatitis without necrosis or infection, unspecified: Secondary | ICD-10-CM | POA: Diagnosis not present

## 2019-03-28 DIAGNOSIS — G9341 Metabolic encephalopathy: Secondary | ICD-10-CM | POA: Diagnosis not present

## 2019-03-28 DIAGNOSIS — J9690 Respiratory failure, unspecified, unspecified whether with hypoxia or hypercapnia: Secondary | ICD-10-CM | POA: Diagnosis not present

## 2019-03-28 DIAGNOSIS — R6520 Severe sepsis without septic shock: Secondary | ICD-10-CM | POA: Diagnosis not present

## 2019-03-28 DIAGNOSIS — K8591 Acute pancreatitis with uninfected necrosis, unspecified: Secondary | ICD-10-CM | POA: Diagnosis not present

## 2019-03-28 DIAGNOSIS — J9621 Acute and chronic respiratory failure with hypoxia: Secondary | ICD-10-CM | POA: Diagnosis not present

## 2019-03-28 LAB — BASIC METABOLIC PANEL
Anion gap: 13 (ref 5–15)
BUN: 21 mg/dL (ref 8–23)
CO2: 28 mmol/L (ref 22–32)
Calcium: 9 mg/dL (ref 8.9–10.3)
Chloride: 98 mmol/L (ref 98–111)
Creatinine, Ser: 0.9 mg/dL (ref 0.61–1.24)
GFR calc Af Amer: >60 mL/min (ref >60)
GFR calc non Af Amer: >60 mL/min (ref >60)
Glucose, Bld: 170 mg/dL — ABNORMAL HIGH (ref 70–99)
Potassium: 3.6 mmol/L (ref 3.5–5.1)
Sodium: 139 mmol/L (ref 135–145)

## 2019-03-28 LAB — TROPONIN I (HIGH SENSITIVITY)
Troponin I (High Sensitivity): 21 ng/L — ABNORMAL HIGH (ref <18)
Troponin I (High Sensitivity): 20 ng/L — ABNORMAL HIGH (ref <18)
Troponin I (High Sensitivity): 21 ng/L — ABNORMAL HIGH (ref <18)

## 2019-03-28 LAB — BLOOD GAS, ARTERIAL
Acid-Base Excess: 3.1 mmol/L — ABNORMAL HIGH (ref 0.0–2.0)
Bicarbonate: 28.7 mmol/L — ABNORMAL HIGH (ref 20.0–28.0)
FIO2: 100
O2 Saturation: 99 %
Patient temperature: 98.6
pCO2 arterial: 57.6 mmHg — ABNORMAL HIGH (ref 32.0–48.0)
pH, Arterial: 7.318 — ABNORMAL LOW (ref 7.350–7.450)
pO2, Arterial: 185 mmHg — ABNORMAL HIGH (ref 83.0–108.0)

## 2019-03-28 LAB — CK: Total CK: <5 U/L — ABNORMAL LOW (ref 49–397)

## 2019-03-28 NOTE — Progress Notes (Addendum)
Pulmonary Critical Care Medicine [place]   PULMONARY CRITICAL CARE SERVICE  PROGRESS NOTE  Date of Service: 03/28/2019  Andrew Compton  [id number]  DOB: [birth year]   DOA: 03/16/2019  Referring Physician: Merton Border, MD  HPI: Andrew Compton is a 68 y.o. male seen for follow up of Acute on Chronic Respiratory Failure.  Patient has an aerosol trach collar goal of 8 hours.  Currently doing well at this time.  Yesterday was able to tolerate 6 hours before he became unresponsive and had to be placed back on the vent.  Medications: Reviewed on Rounds  Physical Exam:  Vitals: Pulse 103 respiration 25 BP 125/78 O2 sat 97% T 98.0  Ventilator Settings ATC 28%  . General: Comfortable at this time . Eyes: Grossly normal lids, irises & conjunctiva . ENT: grossly tongue is normal . Neck: no obvious mass . Cardiovascular: S1 S2 normal no gallop . Respiratory: No rales or rhonchi noted . Abdomen: soft . Skin: no rash seen on limited exam . Musculoskeletal: not rigid . Psychiatric:unable to assess . Neurologic: no seizure no involuntary movements         Lab Data:   Basic Metabolic Panel: Recent Labs  Lab 03/24/19 1313 03/25/19 1235 03/28/19 1230  NA  --   --  139  K 3.0* 3.8 3.6  CL  --   --  98  CO2  --   --  28  GLUCOSE  --   --  170*  BUN  --   --  21  CREATININE  --   --  0.90  CALCIUM  --   --  9.0    ABG: Recent Labs  Lab 03/25/19 1200 03/28/19 1215  PHART 7.490* 7.318*  PCO2ART 41.8 57.6*  PO2ART 64.9* 185*  HCO3 31.6* 28.7*  O2SAT 92.0 99.0    Liver Function Tests: No results for input(s): AST, ALT, ALKPHOS, BILITOT, PROT, ALBUMIN in the last 168 hours. No results for input(s): LIPASE, AMYLASE in the last 168 hours. No results for input(s): AMMONIA in the last 168 hours.  CBC: No results for input(s): WBC, NEUTROABS, HGB, HCT, MCV, PLT in the last 168 hours.  Cardiac Enzymes: Recent Labs  Lab 03/28/19 1230  CKTOTAL  <5*    BNP (last 3 results) No results for input(s): BNP in the last 8760 hours.  ProBNP (last 3 results) No results for input(s): PROBNP in the last 8760 hours.  Radiological Exams: Dg Chest Port 1 View  Result Date: 03/28/2019 CLINICAL DATA:  Respiratory failure. The patient coded and CPR was performed. EXAM: PORTABLE CHEST 1 VIEW COMPARISON:  03/20/2019 FINDINGS: Tracheostomy tube is in good position. The right central venous catheter has been removed since the prior study. Heart size and vascularity are within normal limits. Right lung is clear, improved since the prior study. There is improved aeration at the left lung base although there is residual atelectasis at the left base posterior medially. IMPRESSION: Improved aeration bilaterally. Tracheostomy tube remains in good position. Electronically Signed   By: Lorriane Shire M.D.   On: 03/28/2019 12:43    Assessment/Plan Active Problems:   Acute on chronic respiratory failure with hypoxia (HCC)   Pneumonia due to Pseudomonas aeruginosa (HCC)   Chronic atrial fibrillation   Acute pancreatitis with uninfected necrosis, unspecified   Metabolic encephalopathy   Severe sepsis ([place])   1. Acute on chronic respiratory failure with hypoxia continue with aerosol trach collar trials at this time. 2. Paroxysmal  atrial fibrillation rate controlled 3. Pseudomonas pneumonia treated 4. Chronic atrial fibrillation rate controlled 5. Acute pancreatitis at baseline we will continue with supportive care 6. Metabolic encephalopathy no change 7. Severe sepsis resolved   I have personally seen and evaluated the patient, evaluated laboratory and imaging results, formulated the assessment and plan and placed orders. The Patient requires high complexity decision making for assessment and support.  Case was discussed on Rounds with the Respiratory Therapy Staff  Allyne Gee, MD [facility] Pulmonary Critical Care Medicine Sleep Medicine

## 2019-03-29 DIAGNOSIS — I482 Chronic atrial fibrillation, unspecified: Secondary | ICD-10-CM | POA: Diagnosis not present

## 2019-03-29 DIAGNOSIS — G9341 Metabolic encephalopathy: Secondary | ICD-10-CM | POA: Diagnosis not present

## 2019-03-29 DIAGNOSIS — K8591 Acute pancreatitis with uninfected necrosis, unspecified: Secondary | ICD-10-CM | POA: Diagnosis not present

## 2019-03-29 DIAGNOSIS — J9621 Acute and chronic respiratory failure with hypoxia: Secondary | ICD-10-CM | POA: Diagnosis not present

## 2019-03-29 DIAGNOSIS — J9690 Respiratory failure, unspecified, unspecified whether with hypoxia or hypercapnia: Secondary | ICD-10-CM | POA: Diagnosis not present

## 2019-03-29 DIAGNOSIS — I81 Portal vein thrombosis: Secondary | ICD-10-CM | POA: Diagnosis not present

## 2019-03-29 DIAGNOSIS — A419 Sepsis, unspecified organism: Secondary | ICD-10-CM | POA: Diagnosis not present

## 2019-03-29 DIAGNOSIS — R6520 Severe sepsis without septic shock: Secondary | ICD-10-CM | POA: Diagnosis not present

## 2019-03-29 DIAGNOSIS — J151 Pneumonia due to Pseudomonas: Secondary | ICD-10-CM | POA: Diagnosis not present

## 2019-03-29 NOTE — Progress Notes (Signed)
Pulmonary Critical Care Medicine [place]   PULMONARY CRITICAL CARE SERVICE  PROGRESS NOTE  Date of Service: 03/29/2019  Andrew Compton  [id number]  DOB: Dec 12, 1950   DOA: 03/16/2019  Referring Physician: Merton Border, MD  HPI: Andrew Compton is a 68 y.o. male seen for follow up of Acute on Chronic Respiratory Failure.  Patient had a rapid response apparently with increased work of breathing needed to be bagged and suctioned now is on the ventilator and full support  Medications: Reviewed on Rounds  Physical Exam:  Vitals: Temperature 98.0 pulse 88 respiratory rate 22 blood pressure 144/97 saturations 98%  Ventilator Settings mode ventilation assist control FiO2 35% tidal volume 500 PEEP 5  . General: Comfortable at this time . Eyes: Grossly normal lids, irises & conjunctiva . ENT: grossly tongue is normal . Neck: no obvious mass . Cardiovascular: S1 S2 normal no gallop . Respiratory: Coarse breath sounds with a few rhonchi . Abdomen: soft . Skin: no rash seen on limited exam . Musculoskeletal: not rigid . Psychiatric:unable to assess . Neurologic: no seizure no involuntary movements         Lab Data:   Basic Metabolic Panel: Recent Labs  Lab 03/24/19 1313 03/25/19 1235 03/28/19 1230  NA  --   --  139  K 3.0* 3.8 3.6  CL  --   --  98  CO2  --   --  28  GLUCOSE  --   --  170*  BUN  --   --  21  CREATININE  --   --  0.90  CALCIUM  --   --  9.0    ABG: Recent Labs  Lab 03/25/19 1200 03/28/19 1215  PHART 7.490* 7.318*  PCO2ART 41.8 57.6*  PO2ART 64.9* 185*  HCO3 31.6* 28.7*  O2SAT 92.0 99.0    Liver Function Tests: No results for input(s): AST, ALT, ALKPHOS, BILITOT, PROT, ALBUMIN in the last 168 hours. No results for input(s): LIPASE, AMYLASE in the last 168 hours. No results for input(s): AMMONIA in the last 168 hours.  CBC: No results for input(s): WBC, NEUTROABS, HGB, HCT, MCV, PLT in the last 168 hours.  Cardiac  Enzymes: Recent Labs  Lab 03/28/19 1230  CKTOTAL <5*    BNP (last 3 results) No results for input(s): BNP in the last 8760 hours.  ProBNP (last 3 results) No results for input(s): PROBNP in the last 8760 hours.  Radiological Exams: Dg Chest Port 1 View  Result Date: 03/28/2019 CLINICAL DATA:  Respiratory failure. The patient coded and CPR was performed. EXAM: PORTABLE CHEST 1 VIEW COMPARISON:  03/20/2019 FINDINGS: Tracheostomy tube is in good position. The right central venous catheter has been removed since the prior study. Heart size and vascularity are within normal limits. Right lung is clear, improved since the prior study. There is improved aeration at the left lung base although there is residual atelectasis at the left base posterior medially. IMPRESSION: Improved aeration bilaterally. Tracheostomy tube remains in good position. Electronically Signed   By: Lorriane Shire M.D.   On: 03/28/2019 12:43    Assessment/Plan Active Problems:   Acute on chronic respiratory failure with hypoxia (HCC)   Pneumonia due to Pseudomonas aeruginosa (HCC)   Chronic atrial fibrillation   Acute pancreatitis with uninfected necrosis, unspecified   Metabolic encephalopathy   Severe sepsis ([place])   1. Acute on chronic respiratory failure with hypoxia patient had acute decompensation now seems to be doing better back on  35% FiO2 will reassess 2. Pseudomonas pneumonia treated we will continue with present management 3. Chronic atrial fibrillation rate controlled 4. Acute pancreatitis grossly unchanged 5. Metabolic encephalopathy unchanged we will continue supportive care 6. Severe sepsis resolved hemodynamics are stable   I have personally seen and evaluated the patient, evaluated laboratory and imaging results, formulated the assessment and plan and placed orders. The Patient requires high complexity decision making for assessment and support.  Case was discussed on Rounds with the Respiratory  Therapy Staff  Allyne Gee, MD [facility] Pulmonary Critical Care Medicine Sleep Medicine

## 2019-03-30 DIAGNOSIS — R6520 Severe sepsis without septic shock: Secondary | ICD-10-CM | POA: Diagnosis not present

## 2019-03-30 DIAGNOSIS — I482 Chronic atrial fibrillation, unspecified: Secondary | ICD-10-CM | POA: Diagnosis not present

## 2019-03-30 DIAGNOSIS — K8591 Acute pancreatitis with uninfected necrosis, unspecified: Secondary | ICD-10-CM | POA: Diagnosis not present

## 2019-03-30 DIAGNOSIS — J9621 Acute and chronic respiratory failure with hypoxia: Secondary | ICD-10-CM | POA: Diagnosis not present

## 2019-03-30 DIAGNOSIS — G9341 Metabolic encephalopathy: Secondary | ICD-10-CM | POA: Diagnosis not present

## 2019-03-30 DIAGNOSIS — J151 Pneumonia due to Pseudomonas: Secondary | ICD-10-CM | POA: Diagnosis not present

## 2019-03-30 DIAGNOSIS — A419 Sepsis, unspecified organism: Secondary | ICD-10-CM | POA: Diagnosis not present

## 2019-03-30 DIAGNOSIS — J9690 Respiratory failure, unspecified, unspecified whether with hypoxia or hypercapnia: Secondary | ICD-10-CM | POA: Diagnosis not present

## 2019-03-30 LAB — CBC
HCT: 30.6 % — ABNORMAL LOW (ref 39.0–52.0)
Hemoglobin: 9.5 g/dL — ABNORMAL LOW (ref 13.0–17.0)
MCH: 30.3 pg (ref 26.0–34.0)
MCHC: 31 g/dL (ref 30.0–36.0)
MCV: 97.5 fL (ref 80.0–100.0)
Platelets: 156 10*3/uL (ref 150–400)
RBC: 3.14 MIL/uL — ABNORMAL LOW (ref 4.22–5.81)
RDW: 17 % — ABNORMAL HIGH (ref 11.5–15.5)
WBC: 6.3 10*3/uL (ref 4.0–10.5)
nRBC: 0 % (ref 0.0–0.2)

## 2019-03-30 LAB — RENAL FUNCTION PANEL
Albumin: 2.5 g/dL — ABNORMAL LOW (ref 3.5–5.0)
Anion gap: 10 (ref 5–15)
BUN: 16 mg/dL (ref 8–23)
CO2: 32 mmol/L (ref 22–32)
Calcium: 8.9 mg/dL (ref 8.9–10.3)
Chloride: 98 mmol/L (ref 98–111)
Creatinine, Ser: 0.66 mg/dL (ref 0.61–1.24)
GFR calc Af Amer: >60 mL/min (ref >60)
GFR calc non Af Amer: >60 mL/min (ref >60)
Glucose, Bld: 112 mg/dL — ABNORMAL HIGH (ref 70–99)
Phosphorus: 3.8 mg/dL (ref 2.5–4.6)
Potassium: 3.2 mmol/L — ABNORMAL LOW (ref 3.5–5.1)
Sodium: 140 mmol/L (ref 135–145)

## 2019-03-30 NOTE — Progress Notes (Addendum)
Pulmonary Critical Care Medicine [place]   PULMONARY CRITICAL CARE SERVICE  PROGRESS NOTE  Date of Service: 03/30/2019  Andrew Compton  [id number]  DOB: [birth year]   DOA: 03/16/2019  Referring Physician: Merton Border, MD  HPI: Andrew Compton is a 68 y.o. male seen for follow up of Acute on Chronic Respiratory Failure.  Patient continues to wean at this time with a goal of 8 hours on aerosol trach collar 35% today.  Medications: Reviewed on Rounds  Physical Exam:  Vitals: Pulse 90 respirations 20 BP 131/83 O2 sat 95% temp 98.7  Ventilator Settings ATC 35%  . General: Comfortable at this time . Eyes: Grossly normal lids, irises & conjunctiva . ENT: grossly tongue is normal . Neck: no obvious mass . Cardiovascular: S1 S2 normal no gallop . Respiratory: Coarse breath sounds . Abdomen: soft . Skin: no rash seen on limited exam . Musculoskeletal: not rigid . Psychiatric:unable to assess . Neurologic: no seizure no involuntary movements         Lab Data:   Basic Metabolic Panel: Recent Labs  Lab 03/24/19 1313 03/25/19 1235 03/28/19 1230 03/30/19 0814  NA  --   --  139 140  K 3.0* 3.8 3.6 3.2*  CL  --   --  98 98  CO2  --   --  28 32  GLUCOSE  --   --  170* 112*  BUN  --   --  21 16  CREATININE  --   --  0.90 0.66  CALCIUM  --   --  9.0 8.9  PHOS  --   --   --  3.8    ABG: Recent Labs  Lab 03/25/19 1200 03/28/19 1215  PHART 7.490* 7.318*  PCO2ART 41.8 57.6*  PO2ART 64.9* 185*  HCO3 31.6* 28.7*  O2SAT 92.0 99.0    Liver Function Tests: Recent Labs  Lab 03/30/19 0814  ALBUMIN 2.5*   No results for input(s): LIPASE, AMYLASE in the last 168 hours. No results for input(s): AMMONIA in the last 168 hours.  CBC: Recent Labs  Lab 03/30/19 0814  WBC 6.3  HGB 9.5*  HCT 30.6*  MCV 97.5  PLT 156    Cardiac Enzymes: Recent Labs  Lab 03/28/19 1230  CKTOTAL <5*    BNP (last 3 results) No results for input(s): BNP  in the last 8760 hours.  ProBNP (last 3 results) No results for input(s): PROBNP in the last 8760 hours.  Radiological Exams: No results found.  Assessment/Plan Active Problems:   Acute on chronic respiratory failure with hypoxia (HCC)   Pneumonia due to Pseudomonas aeruginosa (HCC)   Chronic atrial fibrillation   Acute pancreatitis with uninfected necrosis, unspecified   Metabolic encephalopathy   Severe sepsis ([place])   1. Acute on chronic respiratory failure with hypoxia patient patient continue to wean on 35% trach collar for goal of 8 hours today.  Then placed back on full support on vent.  Continue supportive measures pulmonary toilet. 2. Pseudomonas pneumonia treated we will continue with present management 3. Chronic atrial fibrillation rate controlled 4. Acute pancreatitis grossly unchanged 5. Metabolic encephalopathy unchanged we will continue supportive care 6. Severe sepsis resolved hemodynamics are stable   I have personally seen and evaluated the patient, evaluated laboratory and imaging results, formulated the assessment and plan and placed orders. The Patient requires high complexity decision making for assessment and support.  Case was discussed on Rounds with the Respiratory Therapy Staff  [facility]  Richardson Dopp, MD [facility] Pulmonary Critical Care Medicine Sleep Medicine

## 2019-03-31 ENCOUNTER — Other Ambulatory Visit (HOSPITAL_COMMUNITY): Payer: Self-pay

## 2019-03-31 DIAGNOSIS — I482 Chronic atrial fibrillation, unspecified: Secondary | ICD-10-CM | POA: Diagnosis not present

## 2019-03-31 DIAGNOSIS — R319 Hematuria, unspecified: Secondary | ICD-10-CM | POA: Diagnosis not present

## 2019-03-31 DIAGNOSIS — A419 Sepsis, unspecified organism: Secondary | ICD-10-CM | POA: Diagnosis not present

## 2019-03-31 DIAGNOSIS — G9341 Metabolic encephalopathy: Secondary | ICD-10-CM | POA: Diagnosis not present

## 2019-03-31 DIAGNOSIS — J9621 Acute and chronic respiratory failure with hypoxia: Secondary | ICD-10-CM | POA: Diagnosis not present

## 2019-03-31 DIAGNOSIS — K8591 Acute pancreatitis with uninfected necrosis, unspecified: Secondary | ICD-10-CM | POA: Diagnosis not present

## 2019-03-31 DIAGNOSIS — R6520 Severe sepsis without septic shock: Secondary | ICD-10-CM | POA: Diagnosis not present

## 2019-03-31 DIAGNOSIS — J151 Pneumonia due to Pseudomonas: Secondary | ICD-10-CM | POA: Diagnosis not present

## 2019-03-31 LAB — CBC
HCT: 28.3 % — ABNORMAL LOW (ref 39.0–52.0)
Hemoglobin: 8.7 g/dL — ABNORMAL LOW (ref 13.0–17.0)
MCH: 30.4 pg (ref 26.0–34.0)
MCHC: 30.7 g/dL (ref 30.0–36.0)
MCV: 99 fL (ref 80.0–100.0)
Platelets: 156 10*3/uL (ref 150–400)
RBC: 2.86 MIL/uL — ABNORMAL LOW (ref 4.22–5.81)
RDW: 16.6 % — ABNORMAL HIGH (ref 11.5–15.5)
WBC: 5.3 10*3/uL (ref 4.0–10.5)
nRBC: 0 % (ref 0.0–0.2)

## 2019-03-31 LAB — APTT: aPTT: 31 seconds (ref 24–36)

## 2019-03-31 LAB — MAGNESIUM: Magnesium: 1.6 mg/dL — ABNORMAL LOW (ref 1.7–2.4)

## 2019-03-31 LAB — PROTIME-INR
INR: 1.2 (ref 0.8–1.2)
Prothrombin Time: 15.5 seconds — ABNORMAL HIGH (ref 11.4–15.2)

## 2019-03-31 NOTE — Progress Notes (Addendum)
Pulmonary Critical Care Medicine [place]   PULMONARY CRITICAL CARE SERVICE  PROGRESS NOTE  Date of Service: 03/31/2019  Andrew Compton  [id number]  DOB: 10-Apr-1951   DOA: 03/16/2019  Referring Physician: Merton Border, MD  HPI: Andrew Compton is a 68 y.o. male seen for follow up of Acute on Chronic Respiratory Failure.  Patient remains on aerosol trach collar 35% FiO2 for 12-hour goal today.  Medications: Reviewed on Rounds  Physical Exam:  Vitals: Pulse 77 respirations 20 BP 120/78 O2 sat 99% temp 97.6  Ventilator Settings ATC 35%  . General: Comfortable at this time . Eyes: Grossly normal lids, irises & conjunctiva . ENT: grossly tongue is normal . Neck: no obvious mass . Cardiovascular: S1 S2 normal no gallop . Respiratory: Coarse heart sounds . Abdomen: soft . Skin: no rash seen on limited exam . Musculoskeletal: not rigid . Psychiatric:unable to assess . Neurologic: no seizure no involuntary movements         Lab Data:   Basic Metabolic Panel: Recent Labs  Lab 03/25/19 1235 03/28/19 1230 03/30/19 0814 03/31/19 0605  NA  --  139 140  --   K 3.8 3.6 3.2*  --   CL  --  98 98  --   CO2  --  28 32  --   GLUCOSE  --  170* 112*  --   BUN  --  21 16  --   CREATININE  --  0.90 0.66  --   CALCIUM  --  9.0 8.9  --   MG  --   --   --  1.6*  PHOS  --   --  3.8  --     ABG: Recent Labs  Lab 03/25/19 1200 03/28/19 1215  PHART 7.490* 7.318*  PCO2ART 41.8 57.6*  PO2ART 64.9* 185*  HCO3 31.6* 28.7*  O2SAT 92.0 99.0    Liver Function Tests: Recent Labs  Lab 03/30/19 0814  ALBUMIN 2.5*   No results for input(s): LIPASE, AMYLASE in the last 168 hours. No results for input(s): AMMONIA in the last 168 hours.  CBC: Recent Labs  Lab 03/30/19 0814 03/31/19 0605  WBC 6.3 5.3  HGB 9.5* 8.7*  HCT 30.6* 28.3*  MCV 97.5 99.0  PLT 156 156    Cardiac Enzymes: Recent Labs  Lab 03/28/19 1230  CKTOTAL <5*    BNP (last 3  results) No results for input(s): BNP in the last 8760 hours.  ProBNP (last 3 results) No results for input(s): PROBNP in the last 8760 hours.  Radiological Exams: US Renal  Result Date: 03/31/2019 CLINICAL DATA:  Hematuria. EXAM: RENAL / URINARY TRACT ULTRASOUND COMPLETE COMPARISON:  None. FINDINGS: Right Kidney: Renal measurements: 12 x 5.8 x 5.7 cm = volume: 188.3 mL . Echogenicity within normal limits. No mass or hydronephrosis visualized. Left Kidney: Renal measurements: 10.2 x 4.8 x 4.3 cm = volume: 110.3 mL. Echogenicity within normal limits. No mass or hydronephrosis visualized. Bladder: The bladder is decompressed with a Foley catheter. Bladder may be thick walled. IMPRESSION: 1. The bladder appears thick walled but is poorly evaluated due to decompression with a Foley catheter. 2. The kidneys are normal. Electronically Signed   By: Dorise Bullion III M.D   On: 03/31/2019 13:36    Assessment/Plan Active Problems:   Acute on chronic respiratory failure with hypoxia ([place])   Pneumonia due to Pseudomonas aeruginosa ([place])   Chronic atrial fibrillation   Acute pancreatitis with uninfected necrosis, unspecified  Metabolic encephalopathy   Severe sepsis ([place])   1. Acute on chronic respiratory failure with hypoxia patient patient continue to wean on 35% trach collar for goal of 12 hours today.  Then placed back on full support on vent.  Continue supportive measures pulmonary toilet. 2. Pseudomonas pneumonia treated we will continue with present management 3. Chronic atrial fibrillation rate controlled 4. Acute pancreatitis grossly unchanged 5. Metabolic encephalopathy unchanged we will continue supportive care 6. Severe sepsis resolved hemodynamics are stable   I have personally seen and evaluated the patient, evaluated laboratory and imaging results, formulated the assessment and plan and placed orders. The Patient requires high complexity decision making for assessment and support.   Case was discussed on Rounds with the Respiratory Therapy Staff  Allyne Gee, MD [facility] Pulmonary Critical Care Medicine Sleep Medicine

## 2019-04-01 DIAGNOSIS — I482 Chronic atrial fibrillation, unspecified: Secondary | ICD-10-CM | POA: Diagnosis not present

## 2019-04-01 DIAGNOSIS — J9621 Acute and chronic respiratory failure with hypoxia: Secondary | ICD-10-CM | POA: Diagnosis not present

## 2019-04-01 DIAGNOSIS — G9341 Metabolic encephalopathy: Secondary | ICD-10-CM | POA: Diagnosis not present

## 2019-04-01 DIAGNOSIS — K859 Acute pancreatitis without necrosis or infection, unspecified: Secondary | ICD-10-CM | POA: Diagnosis not present

## 2019-04-01 DIAGNOSIS — K8591 Acute pancreatitis with uninfected necrosis, unspecified: Secondary | ICD-10-CM | POA: Diagnosis not present

## 2019-04-01 DIAGNOSIS — J9690 Respiratory failure, unspecified, unspecified whether with hypoxia or hypercapnia: Secondary | ICD-10-CM | POA: Diagnosis not present

## 2019-04-01 DIAGNOSIS — J151 Pneumonia due to Pseudomonas: Secondary | ICD-10-CM | POA: Diagnosis not present

## 2019-04-01 DIAGNOSIS — R6520 Severe sepsis without septic shock: Secondary | ICD-10-CM | POA: Diagnosis not present

## 2019-04-01 DIAGNOSIS — A419 Sepsis, unspecified organism: Secondary | ICD-10-CM | POA: Diagnosis not present

## 2019-04-01 LAB — CBC
HCT: 28.1 % — ABNORMAL LOW (ref 39.0–52.0)
Hemoglobin: 8.8 g/dL — ABNORMAL LOW (ref 13.0–17.0)
MCH: 30.3 pg (ref 26.0–34.0)
MCHC: 31.3 g/dL (ref 30.0–36.0)
MCV: 96.9 fL (ref 80.0–100.0)
Platelets: 170 10*3/uL (ref 150–400)
RBC: 2.9 MIL/uL — ABNORMAL LOW (ref 4.22–5.81)
RDW: 16.6 % — ABNORMAL HIGH (ref 11.5–15.5)
WBC: 6 10*3/uL (ref 4.0–10.5)
nRBC: 0 % (ref 0.0–0.2)

## 2019-04-01 LAB — BASIC METABOLIC PANEL
Anion gap: 12 (ref 5–15)
BUN: 16 mg/dL (ref 8–23)
CO2: 31 mmol/L (ref 22–32)
Calcium: 8.8 mg/dL — ABNORMAL LOW (ref 8.9–10.3)
Chloride: 97 mmol/L — ABNORMAL LOW (ref 98–111)
Creatinine, Ser: 0.69 mg/dL (ref 0.61–1.24)
GFR calc Af Amer: >60 mL/min (ref >60)
GFR calc non Af Amer: >60 mL/min (ref >60)
Glucose, Bld: 124 mg/dL — ABNORMAL HIGH (ref 70–99)
Potassium: 3.3 mmol/L — ABNORMAL LOW (ref 3.5–5.1)
Sodium: 140 mmol/L (ref 135–145)

## 2019-04-01 NOTE — Progress Notes (Addendum)
Pulmonary Critical Care Medicine [place]   PULMONARY CRITICAL CARE SERVICE  PROGRESS NOTE  Date of Service: 04/01/2019  Andrew Compton  [id number]  DOB: 1951-05-27   DOA: 03/16/2019  Referring Physician: Merton Border, MD  HPI: Andrew Compton is a 68 y.o. male seen for follow up of Acute on Chronic Respiratory Failure.  Patient is on nasal trach collar 35% FiO2 for goal of 16 hours today currently satting well no distress.  Medications: Reviewed on Rounds  Physical Exam:  Vitals: Pulse 63 respirations 22 BP 126/82 O2 sat 99% temp 98.3  Ventilator Settings ATC 35%  . General: Comfortable at this time . Eyes: Grossly normal lids, irises & conjunctiva . ENT: grossly tongue is normal . Neck: no obvious mass . Cardiovascular: S1 S2 normal no gallop . Respiratory: Coarse breath sounds . Abdomen: soft . Skin: no rash seen on limited exam . Musculoskeletal: not rigid . Psychiatric:unable to assess . Neurologic: no seizure no involuntary movements         Lab Data:   Basic Metabolic Panel: Recent Labs  Lab 03/28/19 1230 03/30/19 0814 03/31/19 0605 04/01/19 0558  NA 139 140  --  140  K 3.6 3.2*  --  3.3*  CL 98 98  --  97*  CO2 28 32  --  31  GLUCOSE 170* 112*  --  124*  BUN 21 16  --  16  CREATININE 0.90 0.66  --  0.69  CALCIUM 9.0 8.9  --  8.8*  MG  --   --  1.6*  --   PHOS  --  3.8  --   --     ABG: Recent Labs  Lab 03/28/19 1215  PHART 7.318*  PCO2ART 57.6*  PO2ART 185*  HCO3 28.7*  O2SAT 99.0    Liver Function Tests: Recent Labs  Lab 03/30/19 0814  ALBUMIN 2.5*   No results for input(s): LIPASE, AMYLASE in the last 168 hours. No results for input(s): AMMONIA in the last 168 hours.  CBC: Recent Labs  Lab 03/30/19 0814 03/31/19 0605 04/01/19 0558  WBC 6.3 5.3 6.0  HGB 9.5* 8.7* 8.8*  HCT 30.6* 28.3* 28.1*  MCV 97.5 99.0 96.9  PLT 156 156 170    Cardiac Enzymes: Recent Labs  Lab 03/28/19 1230  CKTOTAL  <5*    BNP (last 3 results) No results for input(s): BNP in the last 8760 hours.  ProBNP (last 3 results) No results for input(s): PROBNP in the last 8760 hours.  Radiological Exams: US Renal  Result Date: 03/31/2019 CLINICAL DATA:  Hematuria. EXAM: RENAL / URINARY TRACT ULTRASOUND COMPLETE COMPARISON:  None. FINDINGS: Right Kidney: Renal measurements: 12 x 5.8 x 5.7 cm = volume: 188.3 mL . Echogenicity within normal limits. No mass or hydronephrosis visualized. Left Kidney: Renal measurements: 10.2 x 4.8 x 4.3 cm = volume: 110.3 mL. Echogenicity within normal limits. No mass or hydronephrosis visualized. Bladder: The bladder is decompressed with a Foley catheter. Bladder may be thick walled. IMPRESSION: 1. The bladder appears thick walled but is poorly evaluated due to decompression with a Foley catheter. 2. The kidneys are normal. Electronically Signed   By: Dorise Bullion III M.D   On: 03/31/2019 13:36    Assessment/Plan Active Problems:   Acute on chronic respiratory failure with hypoxia ([place])   Pneumonia due to Pseudomonas aeruginosa ([place])   Chronic atrial fibrillation   Acute pancreatitis with uninfected necrosis, unspecified   Metabolic encephalopathy   Severe  sepsis ([place])   1. Acute on chronic respiratory failure with hypoxia patientpatient continue to wean on 35% trach collar for goal of 16 hours today. Then placed back on full support on vent. Continue supportive measures pulmonary toilet. 2. Pseudomonas pneumonia treated we will continue with present management 3. Chronic atrial fibrillation rate controlled 4. Acute pancreatitis grossly unchanged 5. Metabolic encephalopathy unchanged we will continue supportive care 6. Severe sepsis resolved hemodynamics are stable   I have personally seen and evaluated the patient, evaluated laboratory and imaging results, formulated the assessment and plan and placed orders. The Patient requires high complexity decision making for  assessment and support.  Case was discussed on Rounds with the Respiratory Therapy Staff  Allyne Gee, MD [facility] Pulmonary Critical Care Medicine Sleep Medicine

## 2019-04-02 DIAGNOSIS — J9621 Acute and chronic respiratory failure with hypoxia: Secondary | ICD-10-CM | POA: Diagnosis not present

## 2019-04-02 DIAGNOSIS — R6520 Severe sepsis without septic shock: Secondary | ICD-10-CM | POA: Diagnosis not present

## 2019-04-02 DIAGNOSIS — A419 Sepsis, unspecified organism: Secondary | ICD-10-CM | POA: Diagnosis not present

## 2019-04-02 DIAGNOSIS — K8591 Acute pancreatitis with uninfected necrosis, unspecified: Secondary | ICD-10-CM | POA: Diagnosis not present

## 2019-04-02 DIAGNOSIS — J9690 Respiratory failure, unspecified, unspecified whether with hypoxia or hypercapnia: Secondary | ICD-10-CM | POA: Diagnosis not present

## 2019-04-02 DIAGNOSIS — I482 Chronic atrial fibrillation, unspecified: Secondary | ICD-10-CM | POA: Diagnosis not present

## 2019-04-02 DIAGNOSIS — K859 Acute pancreatitis without necrosis or infection, unspecified: Secondary | ICD-10-CM | POA: Diagnosis not present

## 2019-04-02 DIAGNOSIS — G9341 Metabolic encephalopathy: Secondary | ICD-10-CM | POA: Diagnosis not present

## 2019-04-02 DIAGNOSIS — J151 Pneumonia due to Pseudomonas: Secondary | ICD-10-CM | POA: Diagnosis not present

## 2019-04-02 NOTE — Progress Notes (Addendum)
Pulmonary Critical Care Medicine [place]   PULMONARY CRITICAL CARE SERVICE  PROGRESS NOTE  Date of Service: 04/02/2019  Andrew Compton  [id number]  DOB: 1951/03/12   DOA: 03/16/2019  Referring Physician: Merton Border, MD  HPI: Andrew Compton is a 68 y.o. male seen for follow up of Acute on Chronic Respiratory Failure.  Patient remains on aerosol trach collar 35% FiO2 with a wean goal of 20 hours today.  Medications: Reviewed on Rounds  Physical Exam:  Vitals: Pulse 71 respirations 20 BP 133/78 O2 sat 99% temp 98.1  Ventilator Settings ATC 35%  . General: Comfortable at this time . Eyes: Grossly normal lids, irises & conjunctiva . ENT: grossly tongue is normal . Neck: no obvious mass . Cardiovascular: S1 S2 normal no gallop . Respiratory: Coarse breath sounds . Abdomen: soft . Skin: no rash seen on limited exam . Musculoskeletal: not rigid . Psychiatric:unable to assess . Neurologic: no seizure no involuntary movements         Lab Data:   Basic Metabolic Panel: Recent Labs  Lab 03/28/19 1230 03/30/19 0814 03/31/19 0605 04/01/19 0558  NA 139 140  --  140  K 3.6 3.2*  --  3.3*  CL 98 98  --  97*  CO2 28 32  --  31  GLUCOSE 170* 112*  --  124*  BUN 21 16  --  16  CREATININE 0.90 0.66  --  0.69  CALCIUM 9.0 8.9  --  8.8*  MG  --   --  1.6*  --   PHOS  --  3.8  --   --     ABG: Recent Labs  Lab 03/28/19 1215  PHART 7.318*  PCO2ART 57.6*  PO2ART 185*  HCO3 28.7*  O2SAT 99.0    Liver Function Tests: Recent Labs  Lab 03/30/19 0814  ALBUMIN 2.5*   No results for input(s): LIPASE, AMYLASE in the last 168 hours. No results for input(s): AMMONIA in the last 168 hours.  CBC: Recent Labs  Lab 03/30/19 0814 03/31/19 0605 04/01/19 0558  WBC 6.3 5.3 6.0  HGB 9.5* 8.7* 8.8*  HCT 30.6* 28.3* 28.1*  MCV 97.5 99.0 96.9  PLT 156 156 170    Cardiac Enzymes: Recent Labs  Lab 03/28/19 1230  CKTOTAL <5*    BNP (last  3 results) No results for input(s): BNP in the last 8760 hours.  ProBNP (last 3 results) No results for input(s): PROBNP in the last 8760 hours.  Radiological Exams: No results found.  Assessment/Plan Active Problems:   Acute on chronic respiratory failure with hypoxia (HCC)   Pneumonia due to Pseudomonas aeruginosa (HCC)   Chronic atrial fibrillation   Acute pancreatitis with uninfected necrosis, unspecified   Metabolic encephalopathy   Severe sepsis ([place])   1. Acute on chronic respiratory failure with hypoxia patientpatient continue to wean on 35% trach collar for goal of20hours today. Then placed back on full support on vent. Continue supportive measures pulmonary toilet. 2. Pseudomonas pneumonia treated we will continue with present management 3. Chronic atrial fibrillation rate controlled 4. Acute pancreatitis grossly unchanged 5. Metabolic encephalopathy unchanged we will continue supportive care 6. Severe sepsis resolved hemodynamics are stable   I have personally seen and evaluated the patient, evaluated laboratory and imaging results, formulated the assessment and plan and placed orders. The Patient requires high complexity decision making for assessment and support.  Case was discussed on Rounds with the Respiratory Therapy Staff   A  Humphrey Rolls, MD [facility] Pulmonary Critical Care Medicine Sleep Medicine

## 2019-04-03 DIAGNOSIS — K8591 Acute pancreatitis with uninfected necrosis, unspecified: Secondary | ICD-10-CM | POA: Diagnosis not present

## 2019-04-03 DIAGNOSIS — J9621 Acute and chronic respiratory failure with hypoxia: Secondary | ICD-10-CM | POA: Diagnosis not present

## 2019-04-03 DIAGNOSIS — A419 Sepsis, unspecified organism: Secondary | ICD-10-CM | POA: Diagnosis not present

## 2019-04-03 DIAGNOSIS — I81 Portal vein thrombosis: Secondary | ICD-10-CM | POA: Diagnosis not present

## 2019-04-03 DIAGNOSIS — I482 Chronic atrial fibrillation, unspecified: Secondary | ICD-10-CM | POA: Diagnosis not present

## 2019-04-03 DIAGNOSIS — G9341 Metabolic encephalopathy: Secondary | ICD-10-CM | POA: Diagnosis not present

## 2019-04-03 DIAGNOSIS — R6520 Severe sepsis without septic shock: Secondary | ICD-10-CM | POA: Diagnosis not present

## 2019-04-03 DIAGNOSIS — J9690 Respiratory failure, unspecified, unspecified whether with hypoxia or hypercapnia: Secondary | ICD-10-CM | POA: Diagnosis not present

## 2019-04-03 DIAGNOSIS — J151 Pneumonia due to Pseudomonas: Secondary | ICD-10-CM | POA: Diagnosis not present

## 2019-04-03 LAB — BASIC METABOLIC PANEL
Anion gap: 9 (ref 5–15)
BUN: 15 mg/dL (ref 8–23)
CO2: 29 mmol/L (ref 22–32)
Calcium: 8.9 mg/dL (ref 8.9–10.3)
Chloride: 101 mmol/L (ref 98–111)
Creatinine, Ser: 0.81 mg/dL (ref 0.61–1.24)
GFR calc Af Amer: >60 mL/min (ref >60)
GFR calc non Af Amer: >60 mL/min (ref >60)
Glucose, Bld: 154 mg/dL — ABNORMAL HIGH (ref 70–99)
Potassium: 3.5 mmol/L (ref 3.5–5.1)
Sodium: 139 mmol/L (ref 135–145)

## 2019-04-03 NOTE — Progress Notes (Addendum)
Pulmonary Critical Care Medicine [place]   PULMONARY CRITICAL CARE SERVICE  PROGRESS NOTE  Date of Service: 04/03/2019  Andrew Compton  [id number]  DOB: 1951-05-20   DOA: 03/16/2019  Referring Physician: Merton Border, MD  HPI: Andrew Compton is a 68 y.o. male seen for follow up of Acute on Chronic Respiratory Failure.  Patient continues on 35% aerosol trach collar for 24-hour goal today currently satting well with no distress.  Medications: Reviewed on Rounds  Physical Exam:  Vitals: Pulse 141 respirations 29 BP 119/76 O2 sat 3% temp 96.9  Ventilator Settings ATC 35%  . General: Comfortable at this time . Eyes: Grossly normal lids, irises & conjunctiva . ENT: grossly tongue is normal . Neck: no obvious mass . Cardiovascular: S1 S2 normal no gallop . Respiratory: Coarse breath sounds . Abdomen: soft . Skin: no rash seen on limited exam . Musculoskeletal: not rigid . Psychiatric:unable to assess . Neurologic: no seizure no involuntary movements         Lab Data:   Basic Metabolic Panel: Recent Labs  Lab 03/28/19 1230 03/30/19 0814 03/31/19 0605 04/01/19 0558 04/03/19 0638  NA 139 140  --  140 139  K 3.6 3.2*  --  3.3* 3.5  CL 98 98  --  97* 101  CO2 28 32  --  31 29  GLUCOSE 170* 112*  --  124* 154*  BUN 21 16  --  16 15  CREATININE 0.90 0.66  --  0.69 0.81  CALCIUM 9.0 8.9  --  8.8* 8.9  MG  --   --  1.6*  --   --   PHOS  --  3.8  --   --   --     ABG: Recent Labs  Lab 03/28/19 1215  PHART 7.318*  PCO2ART 57.6*  PO2ART 185*  HCO3 28.7*  O2SAT 99.0    Liver Function Tests: Recent Labs  Lab 03/30/19 0814  ALBUMIN 2.5*   No results for input(s): LIPASE, AMYLASE in the last 168 hours. No results for input(s): AMMONIA in the last 168 hours.  CBC: Recent Labs  Lab 03/30/19 0814 03/31/19 0605 04/01/19 0558  WBC 6.3 5.3 6.0  HGB 9.5* 8.7* 8.8*  HCT 30.6* 28.3* 28.1*  MCV 97.5 99.0 96.9  PLT 156 156 170     Cardiac Enzymes: Recent Labs  Lab 03/28/19 1230  CKTOTAL <5*    BNP (last 3 results) No results for input(s): BNP in the last 8760 hours.  ProBNP (last 3 results) No results for input(s): PROBNP in the last 8760 hours.  Radiological Exams: No results found.  Assessment/Plan Active Problems:   Acute on chronic respiratory failure with hypoxia (HCC)   Pneumonia due to Pseudomonas aeruginosa (HCC)   Chronic atrial fibrillation   Acute pancreatitis with uninfected necrosis, unspecified   Metabolic encephalopathy   Severe sepsis ([place])   1. Acute on chronic respiratory failure with hypoxia patientpatient continue to wean on 35% trach collar for goal of24hours today. Then placed back on full support on vent. Continue supportive measures pulmonary toilet. 2. Pseudomonas pneumonia treated we will continue with present management 3. Chronic atrial fibrillation rate controlled 4. Acute pancreatitis grossly unchanged 5. Metabolic encephalopathy unchanged we will continue supportive care 6. Severe sepsis resolved hemodynamics are stable   I have personally seen and evaluated the patient, evaluated laboratory and imaging results, formulated the assessment and plan and placed orders. The Patient requires high complexity decision making for  assessment and support.  Case was discussed on Rounds with the Respiratory Therapy Staff  Allyne Gee, MD [facility] Pulmonary Critical Care Medicine Sleep Medicine

## 2019-04-04 DIAGNOSIS — J9621 Acute and chronic respiratory failure with hypoxia: Secondary | ICD-10-CM | POA: Diagnosis not present

## 2019-04-04 DIAGNOSIS — J9690 Respiratory failure, unspecified, unspecified whether with hypoxia or hypercapnia: Secondary | ICD-10-CM | POA: Diagnosis not present

## 2019-04-04 DIAGNOSIS — A419 Sepsis, unspecified organism: Secondary | ICD-10-CM | POA: Diagnosis not present

## 2019-04-04 DIAGNOSIS — K8591 Acute pancreatitis with uninfected necrosis, unspecified: Secondary | ICD-10-CM | POA: Diagnosis not present

## 2019-04-04 DIAGNOSIS — I482 Chronic atrial fibrillation, unspecified: Secondary | ICD-10-CM | POA: Diagnosis not present

## 2019-04-04 DIAGNOSIS — J151 Pneumonia due to Pseudomonas: Secondary | ICD-10-CM | POA: Diagnosis not present

## 2019-04-04 DIAGNOSIS — G9341 Metabolic encephalopathy: Secondary | ICD-10-CM | POA: Diagnosis not present

## 2019-04-04 DIAGNOSIS — I81 Portal vein thrombosis: Secondary | ICD-10-CM | POA: Diagnosis not present

## 2019-04-04 DIAGNOSIS — R6520 Severe sepsis without septic shock: Secondary | ICD-10-CM | POA: Diagnosis not present

## 2019-04-04 LAB — BLOOD GAS, ARTERIAL
Acid-Base Excess: 8.8 mmol/L — ABNORMAL HIGH (ref 0.0–2.0)
Bicarbonate: 32.4 mmol/L — ABNORMAL HIGH (ref 20.0–28.0)
FIO2: 28
O2 Saturation: 95.4 %
Patient temperature: 98.6
pCO2 arterial: 41.3 mmHg (ref 32.0–48.0)
pH, Arterial: 7.506 — ABNORMAL HIGH (ref 7.350–7.450)
pO2, Arterial: 74.6 mmHg — ABNORMAL LOW (ref 83.0–108.0)

## 2019-04-04 NOTE — Progress Notes (Addendum)
Pulmonary Critical Care Medicine [place]   PULMONARY CRITICAL CARE SERVICE  PROGRESS NOTE  Date of Service: 04/04/2019  Andrew Compton  [id number]  DOB: [birth year]   DOA: 03/16/2019  Referring Physician: Merton Border, MD  HPI: Andrew Compton is a 68 y.o. male seen for follow up of Acute on Chronic Respiratory Failure.  Patient has a 48-hour goal today on aerosol trach collar 28% FiO2 [facility] at this time satting well with no distress.  Medications: Reviewed on Rounds  Physical Exam:  Vitals: Pulse 91 respirations 20 BP 49/95 O2 sat 97% temp 96.8  Ventilator Settings ATC 28%  . General: Comfortable at this time . Eyes: Grossly normal lids, irises & conjunctiva . ENT: grossly tongue is normal . Neck: no obvious mass . Cardiovascular: S1 S2 normal no gallop . Respiratory: Coarse breath sounds . Abdomen: soft . Skin: no rash seen on limited exam . Musculoskeletal: not rigid . Psychiatric:unable to assess . Neurologic: no seizure no involuntary movements         Lab Data:   Basic Metabolic Panel: Recent Labs  Lab 03/30/19 0814 03/31/19 0605 04/01/19 0558 04/03/19 0638  NA 140  --  140 139  K 3.2*  --  3.3* 3.5  CL 98  --  97* 101  CO2 32  --  31 29  GLUCOSE 112*  --  124* 154*  BUN 16  --  16 15  CREATININE 0.66  --  0.69 0.81  CALCIUM 8.9  --  8.8* 8.9  MG  --  1.6*  --   --   PHOS 3.8  --   --   --     ABG: Recent Labs  Lab 04/04/19 1708  PHART 7.506*  PCO2ART 41.3  PO2ART 74.6*  HCO3 32.4*  O2SAT 95.4    Liver Function Tests: Recent Labs  Lab 03/30/19 0814  ALBUMIN 2.5*   No results for input(s): LIPASE, AMYLASE in the last 168 hours. No results for input(s): AMMONIA in the last 168 hours.  CBC: Recent Labs  Lab 03/30/19 0814 03/31/19 0605 04/01/19 0558  WBC 6.3 5.3 6.0  HGB 9.5* 8.7* 8.8*  HCT 30.6* 28.3* 28.1*  MCV 97.5 99.0 96.9  PLT 156 156 170    Cardiac Enzymes: No results for input(s): CKTOTAL,  CKMB, CKMBINDEX, TROPONINI in the last 168 hours.  BNP (last 3 results) No results for input(s): BNP in the last 8760 hours.  ProBNP (last 3 results) No results for input(s): PROBNP in the last 8760 hours.  Radiological Exams: No results found.  Assessment/Plan Active Problems:   Acute on chronic respiratory failure with hypoxia (HCC)   Pneumonia due to Pseudomonas aeruginosa (HCC)   Chronic atrial fibrillation   Acute pancreatitis with uninfected necrosis, unspecified   Metabolic encephalopathy   Severe sepsis ([place])   1. Acute on chronic respiratory failure with hypoxia patientpatient continue to wean on 28% trach collar for goal of48hours today. Then placed back on full support on vent. Continue supportive measures pulmonary toilet. 2. Pseudomonas pneumonia treated we will continue with present management 3. Chronic atrial fibrillation rate controlled 4. Acute pancreatitis grossly unchanged 5. Metabolic encephalopathy unchanged we will continue supportive care 6. Severe sepsis resolved hemodynamics are stable   I have personally seen and evaluated the patient, evaluated laboratory and imaging results, formulated the assessment and plan and placed orders. The Patient requires high complexity decision making for assessment and support.  Case was discussed on Rounds with  the Respiratory Therapy Staff  Allyne Gee, MD [facility] Pulmonary Critical Care Medicine Sleep Medicine

## 2019-04-05 DIAGNOSIS — A419 Sepsis, unspecified organism: Secondary | ICD-10-CM | POA: Diagnosis not present

## 2019-04-05 DIAGNOSIS — I81 Portal vein thrombosis: Secondary | ICD-10-CM | POA: Diagnosis not present

## 2019-04-05 DIAGNOSIS — I482 Chronic atrial fibrillation, unspecified: Secondary | ICD-10-CM | POA: Diagnosis not present

## 2019-04-05 DIAGNOSIS — G9341 Metabolic encephalopathy: Secondary | ICD-10-CM | POA: Diagnosis not present

## 2019-04-05 DIAGNOSIS — J151 Pneumonia due to Pseudomonas: Secondary | ICD-10-CM | POA: Diagnosis not present

## 2019-04-05 DIAGNOSIS — R6520 Severe sepsis without septic shock: Secondary | ICD-10-CM | POA: Diagnosis not present

## 2019-04-05 DIAGNOSIS — J9690 Respiratory failure, unspecified, unspecified whether with hypoxia or hypercapnia: Secondary | ICD-10-CM | POA: Diagnosis not present

## 2019-04-05 DIAGNOSIS — J9621 Acute and chronic respiratory failure with hypoxia: Secondary | ICD-10-CM | POA: Diagnosis not present

## 2019-04-05 DIAGNOSIS — K8591 Acute pancreatitis with uninfected necrosis, unspecified: Secondary | ICD-10-CM | POA: Diagnosis not present

## 2019-04-05 NOTE — Progress Notes (Addendum)
Pulmonary Critical Care Medicine [place]   PULMONARY CRITICAL CARE SERVICE  PROGRESS NOTE  Date of Service: 04/05/2019  Andrew Compton  [id number]  DOB: Aug 05, 1950   DOA: 03/16/2019  Referring Physician: Merton Border, MD  HPI: Andrew Compton is a 68 y.o. male seen for follow up of Acute on Chronic Respiratory Failure.  Patient currently on 20% aerosol trach collar for more than 48 hours.  Trach changed today to a #6 cuffless and capping as tolerated.  Medications: Reviewed on Rounds  Physical Exam:  Vitals: Pulse 100 respirations 25 BP 148/98 O2 sat 96 and temp 97.6  Ventilator Settings ATC 28%  . General: Comfortable at this time . Eyes: Grossly normal lids, irises & conjunctiva . ENT: grossly tongue is normal . Neck: no obvious mass . Cardiovascular: S1 S2 normal no gallop . Respiratory: Coarse breath sounds . Abdomen: soft . Skin: no rash seen on limited exam . Musculoskeletal: not rigid . Psychiatric:unable to assess . Neurologic: no seizure no involuntary movements         Lab Data:   Basic Metabolic Panel: Recent Labs  Lab 03/30/19 0814 03/31/19 0605 04/01/19 0558 04/03/19 0638  NA 140  --  140 139  K 3.2*  --  3.3* 3.5  CL 98  --  97* 101  CO2 32  --  31 29  GLUCOSE 112*  --  124* 154*  BUN 16  --  16 15  CREATININE 0.66  --  0.69 0.81  CALCIUM 8.9  --  8.8* 8.9  MG  --  1.6*  --   --   PHOS 3.8  --   --   --     ABG: Recent Labs  Lab 04/04/19 1708  PHART 7.506*  PCO2ART 41.3  PO2ART 74.6*  HCO3 32.4*  O2SAT 95.4    Liver Function Tests: Recent Labs  Lab 03/30/19 0814  ALBUMIN 2.5*   No results for input(s): LIPASE, AMYLASE in the last 168 hours. No results for input(s): AMMONIA in the last 168 hours.  CBC: Recent Labs  Lab 03/30/19 0814 03/31/19 0605 04/01/19 0558  WBC 6.3 5.3 6.0  HGB 9.5* 8.7* 8.8*  HCT 30.6* 28.3* 28.1*  MCV 97.5 99.0 96.9  PLT 156 156 170    Cardiac Enzymes: No  results for input(s): CKTOTAL, CKMB, CKMBINDEX, TROPONINI in the last 168 hours.  BNP (last 3 results) No results for input(s): BNP in the last 8760 hours.  ProBNP (last 3 results) No results for input(s): PROBNP in the last 8760 hours.  Radiological Exams: No results found.  Assessment/Plan Active Problems:   Acute on chronic respiratory failure with hypoxia (HCC)   Pneumonia due to Pseudomonas aeruginosa (HCC)   Chronic atrial fibrillation   Acute pancreatitis with uninfected necrosis, unspecified   Metabolic encephalopathy   Severe sepsis ([place])   1. Acute on chronic respiratory failure with hypoxia patient is been more than 48 hours on aerosol trach collar 20% FiO2 trach downsized to #6 cuffless today will progress with capping as tolerated. Continue supportive measures pulmonary toilet. 2. Pseudomonas pneumonia treated we will continue with present management 3. Chronic atrial fibrillation rate controlled 4. Acute pancreatitis grossly unchanged 5. Metabolic encephalopathy unchanged we will continue supportive care 6. Severe sepsis resolved hemodynamics are stable   I have personally seen and evaluated the patient, evaluated laboratory and imaging results, formulated the assessment and plan and placed orders. The Patient requires high complexity decision making for assessment  and support.  Case was discussed on Rounds with the Respiratory Therapy Staff  Allyne Gee, MD [facility] Pulmonary Critical Care Medicine Sleep Medicine

## 2019-04-06 ENCOUNTER — Other Ambulatory Visit (HOSPITAL_COMMUNITY): Payer: Self-pay

## 2019-04-06 DIAGNOSIS — I482 Chronic atrial fibrillation, unspecified: Secondary | ICD-10-CM | POA: Diagnosis not present

## 2019-04-06 DIAGNOSIS — G9341 Metabolic encephalopathy: Secondary | ICD-10-CM | POA: Diagnosis not present

## 2019-04-06 DIAGNOSIS — J9621 Acute and chronic respiratory failure with hypoxia: Secondary | ICD-10-CM | POA: Diagnosis not present

## 2019-04-06 DIAGNOSIS — A419 Sepsis, unspecified organism: Secondary | ICD-10-CM | POA: Diagnosis not present

## 2019-04-06 DIAGNOSIS — R7881 Bacteremia: Secondary | ICD-10-CM | POA: Diagnosis not present

## 2019-04-06 DIAGNOSIS — K8591 Acute pancreatitis with uninfected necrosis, unspecified: Secondary | ICD-10-CM | POA: Diagnosis not present

## 2019-04-06 DIAGNOSIS — I81 Portal vein thrombosis: Secondary | ICD-10-CM | POA: Diagnosis not present

## 2019-04-06 DIAGNOSIS — J151 Pneumonia due to Pseudomonas: Secondary | ICD-10-CM | POA: Diagnosis not present

## 2019-04-06 DIAGNOSIS — R6520 Severe sepsis without septic shock: Secondary | ICD-10-CM | POA: Diagnosis not present

## 2019-04-06 NOTE — Progress Notes (Addendum)
Pulmonary Critical Care Medicine [place]   PULMONARY CRITICAL CARE SERVICE  PROGRESS NOTE  Date of Service: 04/06/2019  Andrew Compton  [id number]  DOB: 1951-07-27   DOA: 03/16/2019  Referring Physician: Merton Border, MD  HPI: Andrew Compton is a 68 y.o. male seen for follow up of Acute on Chronic Respiratory Failure.  Patient continues to be capped on 1 L of oxygen via nasal cannula satting well with no distress.  Medications: Reviewed on Rounds  Physical Exam:  Vitals: Pulse 111 respirations 27 BP 132/89 O2 sat 98% temp 97.8  Ventilator Settings 1 L nasal cannula  . General: Comfortable at this time . Eyes: Grossly normal lids, irises & conjunctiva . ENT: grossly tongue is normal . Neck: no obvious mass . Cardiovascular: S1 S2 normal no gallop . Respiratory: No rales or rhonchi noted . Abdomen: soft . Skin: no rash seen on limited exam . Musculoskeletal: not rigid . Psychiatric:unable to assess . Neurologic: no seizure no involuntary movements         Lab Data:   Basic Metabolic Panel: Recent Labs  Lab 03/31/19 0605 04/01/19 0558 04/03/19 0638  NA  --  140 139  K  --  3.3* 3.5  CL  --  97* 101  CO2  --  31 29  GLUCOSE  --  124* 154*  BUN  --  16 15  CREATININE  --  0.69 0.81  CALCIUM  --  8.8* 8.9  MG 1.6*  --   --     ABG: Recent Labs  Lab 04/04/19 1708  PHART 7.506*  PCO2ART 41.3  PO2ART 74.6*  HCO3 32.4*  O2SAT 95.4    Liver Function Tests: No results for input(s): AST, ALT, ALKPHOS, BILITOT, PROT, ALBUMIN in the last 168 hours. No results for input(s): LIPASE, AMYLASE in the last 168 hours. No results for input(s): AMMONIA in the last 168 hours.  CBC: Recent Labs  Lab 03/31/19 0605 04/01/19 0558  WBC 5.3 6.0  HGB 8.7* 8.8*  HCT 28.3* 28.1*  MCV 99.0 96.9  PLT 156 170    Cardiac Enzymes: No results for input(s): CKTOTAL, CKMB, CKMBINDEX, TROPONINI in the last 168 hours.  BNP (last 3 results) No  results for input(s): BNP in the last 8760 hours.  ProBNP (last 3 results) No results for input(s): PROBNP in the last 8760 hours.  Radiological Exams: No results found.  Assessment/Plan Active Problems:   Acute on chronic respiratory failure with hypoxia (HCC)   Pneumonia due to Pseudomonas aeruginosa (HCC)   Chronic atrial fibrillation   Acute pancreatitis with uninfected necrosis, unspecified   Metabolic encephalopathy   Severe sepsis ([place])   1. Acute on chronic respiratory failure with hypoxia  patient continues on 1 L nasal cannula capped at this time.  Continue supportive measures and pulmonary toilet. 2. Pseudomonas pneumonia treated we will continue with present management 3. Chronic atrial fibrillation rate controlled 4. Acute pancreatitis grossly unchanged 5. Metabolic encephalopathy unchanged we will continue supportive care 6. Severe sepsis resolved hemodynamics are stable   I have personally seen and evaluated the patient, evaluated laboratory and imaging results, formulated the assessment and plan and placed orders. The Patient requires high complexity decision making for assessment and support.  Case was discussed on Rounds with the Respiratory Therapy Staff  Allyne Gee, MD [facility] Pulmonary Critical Care Medicine Sleep Medicine

## 2019-04-07 DIAGNOSIS — J151 Pneumonia due to Pseudomonas: Secondary | ICD-10-CM | POA: Diagnosis not present

## 2019-04-07 DIAGNOSIS — J9621 Acute and chronic respiratory failure with hypoxia: Secondary | ICD-10-CM | POA: Diagnosis not present

## 2019-04-07 DIAGNOSIS — J9690 Respiratory failure, unspecified, unspecified whether with hypoxia or hypercapnia: Secondary | ICD-10-CM | POA: Diagnosis not present

## 2019-04-07 DIAGNOSIS — R6520 Severe sepsis without septic shock: Secondary | ICD-10-CM

## 2019-04-07 DIAGNOSIS — I482 Chronic atrial fibrillation, unspecified: Secondary | ICD-10-CM | POA: Diagnosis not present

## 2019-04-07 DIAGNOSIS — A419 Sepsis, unspecified organism: Secondary | ICD-10-CM

## 2019-04-07 DIAGNOSIS — I81 Portal vein thrombosis: Secondary | ICD-10-CM | POA: Diagnosis not present

## 2019-04-07 DIAGNOSIS — G9341 Metabolic encephalopathy: Secondary | ICD-10-CM | POA: Diagnosis not present

## 2019-04-08 DIAGNOSIS — I81 Portal vein thrombosis: Secondary | ICD-10-CM | POA: Diagnosis not present

## 2019-04-08 DIAGNOSIS — A419 Sepsis, unspecified organism: Secondary | ICD-10-CM | POA: Diagnosis not present

## 2019-04-08 DIAGNOSIS — R6520 Severe sepsis without septic shock: Secondary | ICD-10-CM | POA: Diagnosis not present

## 2019-04-08 DIAGNOSIS — J9690 Respiratory failure, unspecified, unspecified whether with hypoxia or hypercapnia: Secondary | ICD-10-CM | POA: Diagnosis not present

## 2019-04-08 DIAGNOSIS — G9341 Metabolic encephalopathy: Secondary | ICD-10-CM | POA: Diagnosis not present

## 2019-04-08 DIAGNOSIS — I482 Chronic atrial fibrillation, unspecified: Secondary | ICD-10-CM | POA: Diagnosis not present

## 2019-04-08 DIAGNOSIS — J151 Pneumonia due to Pseudomonas: Secondary | ICD-10-CM | POA: Diagnosis not present

## 2019-04-08 DIAGNOSIS — J9621 Acute and chronic respiratory failure with hypoxia: Secondary | ICD-10-CM | POA: Diagnosis not present

## 2019-04-09 DIAGNOSIS — J151 Pneumonia due to Pseudomonas: Secondary | ICD-10-CM | POA: Diagnosis not present

## 2019-04-09 DIAGNOSIS — I482 Chronic atrial fibrillation, unspecified: Secondary | ICD-10-CM | POA: Diagnosis not present

## 2019-04-09 DIAGNOSIS — I81 Portal vein thrombosis: Secondary | ICD-10-CM | POA: Diagnosis not present

## 2019-04-09 DIAGNOSIS — R7881 Bacteremia: Secondary | ICD-10-CM | POA: Diagnosis not present

## 2019-04-09 LAB — COMPREHENSIVE METABOLIC PANEL
ALT: 14 U/L (ref 0–44)
AST: 16 U/L (ref 15–41)
Albumin: 2.6 g/dL — ABNORMAL LOW (ref 3.5–5.0)
Alkaline Phosphatase: 99 U/L (ref 38–126)
Anion gap: 10 (ref 5–15)
BUN: 14 mg/dL (ref 8–23)
CO2: 29 mmol/L (ref 22–32)
Calcium: 9.1 mg/dL (ref 8.9–10.3)
Chloride: 99 mmol/L (ref 98–111)
Creatinine, Ser: 0.82 mg/dL (ref 0.61–1.24)
GFR calc Af Amer: >60 mL/min (ref >60)
GFR calc non Af Amer: >60 mL/min (ref >60)
Glucose, Bld: 122 mg/dL — ABNORMAL HIGH (ref 70–99)
Potassium: 3.1 mmol/L — ABNORMAL LOW (ref 3.5–5.1)
Sodium: 138 mmol/L (ref 135–145)
Total Bilirubin: 0.6 mg/dL (ref 0.3–1.2)
Total Protein: 6.3 g/dL — ABNORMAL LOW (ref 6.5–8.1)

## 2019-04-09 LAB — URINE CULTURE

## 2019-04-09 LAB — URINALYSIS, ROUTINE W REFLEX MICROSCOPIC
Bilirubin Urine: NEGATIVE
Glucose, UA: NEGATIVE mg/dL
Ketones, ur: NEGATIVE mg/dL
Nitrite: NEGATIVE
Protein, ur: 100 mg/dL — AB
RBC / HPF: >50 RBC/hpf — ABNORMAL HIGH (ref 0–5)
Specific Gravity, Urine: 1.014 (ref 1.005–1.030)
pH: 6 (ref 5.0–8.0)

## 2019-04-09 LAB — CBC
HCT: 31.8 % — ABNORMAL LOW (ref 39.0–52.0)
Hemoglobin: 10.1 g/dL — ABNORMAL LOW (ref 13.0–17.0)
MCH: 30.3 pg (ref 26.0–34.0)
MCHC: 31.8 g/dL (ref 30.0–36.0)
MCV: 95.5 fL (ref 80.0–100.0)
Platelets: 177 10*3/uL (ref 150–400)
RBC: 3.33 MIL/uL — ABNORMAL LOW (ref 4.22–5.81)
RDW: 16.3 % — ABNORMAL HIGH (ref 11.5–15.5)
WBC: 5.6 10*3/uL (ref 4.0–10.5)
nRBC: 0 % (ref 0.0–0.2)

## 2019-04-10 ENCOUNTER — Other Ambulatory Visit (HOSPITAL_COMMUNITY): Payer: Self-pay

## 2019-04-10 DIAGNOSIS — J151 Pneumonia due to Pseudomonas: Secondary | ICD-10-CM | POA: Diagnosis not present

## 2019-04-10 DIAGNOSIS — R319 Hematuria, unspecified: Secondary | ICD-10-CM | POA: Diagnosis not present

## 2019-04-10 DIAGNOSIS — I482 Chronic atrial fibrillation, unspecified: Secondary | ICD-10-CM | POA: Diagnosis not present

## 2019-04-10 DIAGNOSIS — I81 Portal vein thrombosis: Secondary | ICD-10-CM | POA: Diagnosis not present

## 2019-04-10 DIAGNOSIS — R7881 Bacteremia: Secondary | ICD-10-CM | POA: Diagnosis not present

## 2019-04-10 LAB — SARS CORONAVIRUS 2 BY RT PCR (HOSPITAL ORDER, PERFORMED IN ~~LOC~~ HOSPITAL LAB): SARS Coronavirus 2: NEGATIVE

## 2019-04-11 ENCOUNTER — Encounter: Payer: Self-pay | Admitting: Physical Medicine and Rehabilitation

## 2019-04-11 ENCOUNTER — Encounter (HOSPITAL_COMMUNITY): Payer: Self-pay

## 2019-04-11 ENCOUNTER — Inpatient Hospital Stay (HOSPITAL_COMMUNITY)
Admission: RE | Admit: 2019-04-11 | Discharge: 2019-04-26 | DRG: 945 | Disposition: A | Discharge status: Home / Self Care | Payer: Medicare Other | Source: Other Acute Inpatient Hospital | Attending: Physical Medicine & Rehabilitation | Admitting: Physical Medicine & Rehabilitation

## 2019-04-11 ENCOUNTER — Other Ambulatory Visit: Payer: Self-pay

## 2019-04-11 ENCOUNTER — Encounter: Payer: Self-pay | Admitting: *Deleted

## 2019-04-11 DIAGNOSIS — E039 Hypothyroidism, unspecified: Secondary | ICD-10-CM | POA: Diagnosis present

## 2019-04-11 DIAGNOSIS — Z931 Gastrostomy status: Secondary | ICD-10-CM

## 2019-04-11 DIAGNOSIS — R339 Retention of urine, unspecified: Secondary | ICD-10-CM | POA: Diagnosis not present

## 2019-04-11 DIAGNOSIS — J151 Pneumonia due to Pseudomonas: Secondary | ICD-10-CM | POA: Diagnosis not present

## 2019-04-11 DIAGNOSIS — M109 Gout, unspecified: Secondary | ICD-10-CM | POA: Diagnosis present

## 2019-04-11 DIAGNOSIS — D638 Anemia in other chronic diseases classified elsewhere: Secondary | ICD-10-CM | POA: Diagnosis present

## 2019-04-11 DIAGNOSIS — E119 Type 2 diabetes mellitus without complications: Secondary | ICD-10-CM | POA: Diagnosis not present

## 2019-04-11 DIAGNOSIS — Z79890 Hormone replacement therapy (postmenopausal): Secondary | ICD-10-CM

## 2019-04-11 DIAGNOSIS — I4892 Unspecified atrial flutter: Secondary | ICD-10-CM | POA: Diagnosis not present

## 2019-04-11 DIAGNOSIS — N401 Enlarged prostate with lower urinary tract symptoms: Secondary | ICD-10-CM | POA: Diagnosis present

## 2019-04-11 DIAGNOSIS — K219 Gastro-esophageal reflux disease without esophagitis: Secondary | ICD-10-CM | POA: Diagnosis not present

## 2019-04-11 DIAGNOSIS — Z7984 Long term (current) use of oral hypoglycemic drugs: Secondary | ICD-10-CM | POA: Diagnosis not present

## 2019-04-11 DIAGNOSIS — E876 Hypokalemia: Secondary | ICD-10-CM | POA: Diagnosis present

## 2019-04-11 DIAGNOSIS — K2270 Barrett's esophagus without dysplasia: Secondary | ICD-10-CM | POA: Diagnosis present

## 2019-04-11 DIAGNOSIS — I10 Essential (primary) hypertension: Secondary | ICD-10-CM | POA: Diagnosis present

## 2019-04-11 DIAGNOSIS — R7309 Other abnormal glucose: Secondary | ICD-10-CM | POA: Diagnosis present

## 2019-04-11 DIAGNOSIS — Z87891 Personal history of nicotine dependence: Secondary | ICD-10-CM

## 2019-04-11 DIAGNOSIS — I4811 Longstanding persistent atrial fibrillation: Secondary | ICD-10-CM | POA: Diagnosis not present

## 2019-04-11 DIAGNOSIS — I714 Abdominal aortic aneurysm, without rupture: Secondary | ICD-10-CM | POA: Diagnosis present

## 2019-04-11 DIAGNOSIS — Z833 Family history of diabetes mellitus: Secondary | ICD-10-CM

## 2019-04-11 DIAGNOSIS — R0989 Other specified symptoms and signs involving the circulatory and respiratory systems: Secondary | ICD-10-CM

## 2019-04-11 DIAGNOSIS — Z7901 Long term (current) use of anticoagulants: Secondary | ICD-10-CM

## 2019-04-11 DIAGNOSIS — I4821 Permanent atrial fibrillation: Secondary | ICD-10-CM | POA: Diagnosis present

## 2019-04-11 DIAGNOSIS — R310 Gross hematuria: Secondary | ICD-10-CM | POA: Diagnosis not present

## 2019-04-11 DIAGNOSIS — I4891 Unspecified atrial fibrillation: Secondary | ICD-10-CM | POA: Diagnosis not present

## 2019-04-11 DIAGNOSIS — Z880 Allergy status to penicillin: Secondary | ICD-10-CM | POA: Diagnosis not present

## 2019-04-11 DIAGNOSIS — M25462 Effusion, left knee: Secondary | ICD-10-CM | POA: Diagnosis not present

## 2019-04-11 DIAGNOSIS — Z881 Allergy status to other antibiotic agents status: Secondary | ICD-10-CM | POA: Diagnosis not present

## 2019-04-11 DIAGNOSIS — Z888 Allergy status to other drugs, medicaments and biological substances status: Secondary | ICD-10-CM | POA: Diagnosis not present

## 2019-04-11 DIAGNOSIS — R338 Other retention of urine: Secondary | ICD-10-CM | POA: Diagnosis present

## 2019-04-11 DIAGNOSIS — E785 Hyperlipidemia, unspecified: Secondary | ICD-10-CM | POA: Diagnosis present

## 2019-04-11 DIAGNOSIS — Z803 Family history of malignant neoplasm of breast: Secondary | ICD-10-CM | POA: Diagnosis not present

## 2019-04-11 DIAGNOSIS — R5381 Other malaise: Principal | ICD-10-CM

## 2019-04-11 DIAGNOSIS — Z85820 Personal history of malignant melanoma of skin: Secondary | ICD-10-CM | POA: Diagnosis not present

## 2019-04-11 DIAGNOSIS — Z882 Allergy status to sulfonamides status: Secondary | ICD-10-CM | POA: Diagnosis not present

## 2019-04-11 DIAGNOSIS — I483 Typical atrial flutter: Secondary | ICD-10-CM | POA: Diagnosis not present

## 2019-04-11 LAB — CBC WITH DIFFERENTIAL/PLATELET
Abs Immature Granulocytes: 0.02 10*3/uL (ref 0.00–0.07)
Basophils Absolute: 0 10*3/uL (ref 0.0–0.1)
Basophils Relative: 1 %
Eosinophils Absolute: 0.2 10*3/uL (ref 0.0–0.5)
Eosinophils Relative: 4 %
HCT: 31 % — ABNORMAL LOW (ref 39.0–52.0)
Hemoglobin: 9.6 g/dL — ABNORMAL LOW (ref 13.0–17.0)
Immature Granulocytes: 0 %
Lymphocytes Relative: 23 %
Lymphs Abs: 1.3 10*3/uL (ref 0.7–4.0)
MCH: 30.1 pg (ref 26.0–34.0)
MCHC: 31 g/dL (ref 30.0–36.0)
MCV: 97.2 fL (ref 80.0–100.0)
Monocytes Absolute: 0.4 10*3/uL (ref 0.1–1.0)
Monocytes Relative: 7 %
Neutro Abs: 3.6 10*3/uL (ref 1.7–7.7)
Neutrophils Relative %: 65 %
Platelets: 193 10*3/uL (ref 150–400)
RBC: 3.19 MIL/uL — ABNORMAL LOW (ref 4.22–5.81)
RDW: 16.3 % — ABNORMAL HIGH (ref 11.5–15.5)
WBC: 5.5 10*3/uL (ref 4.0–10.5)
nRBC: 0 % (ref 0.0–0.2)

## 2019-04-11 LAB — COMPREHENSIVE METABOLIC PANEL
ALT: 16 U/L (ref 0–44)
AST: 16 U/L (ref 15–41)
Albumin: 2.7 g/dL — ABNORMAL LOW (ref 3.5–5.0)
Alkaline Phosphatase: 100 U/L (ref 38–126)
Anion gap: 10 (ref 5–15)
BUN: 15 mg/dL (ref 8–23)
CO2: 27 mmol/L (ref 22–32)
Calcium: 9.1 mg/dL (ref 8.9–10.3)
Chloride: 102 mmol/L (ref 98–111)
Creatinine, Ser: 0.92 mg/dL (ref 0.61–1.24)
GFR calc Af Amer: >60 mL/min (ref >60)
GFR calc non Af Amer: >60 mL/min (ref >60)
Glucose, Bld: 131 mg/dL — ABNORMAL HIGH (ref 70–99)
Potassium: 3.8 mmol/L (ref 3.5–5.1)
Sodium: 139 mmol/L (ref 135–145)
Total Bilirubin: 0.4 mg/dL (ref 0.3–1.2)
Total Protein: 6.7 g/dL (ref 6.5–8.1)

## 2019-04-11 LAB — GLUCOSE, CAPILLARY
Glucose-Capillary: 112 mg/dL — ABNORMAL HIGH (ref 70–99)
Glucose-Capillary: 120 mg/dL — ABNORMAL HIGH (ref 70–99)

## 2019-04-11 LAB — MAGNESIUM: Magnesium: 1.6 mg/dL — ABNORMAL LOW (ref 1.7–2.4)

## 2019-04-11 MED ORDER — AMANTADINE HCL 100 MG PO CAPS
100.0000 mg | ORAL_CAPSULE | Freq: Every day | ORAL | Status: DC
  Administered 2019-04-12 – 2019-04-26 (×15): 100 mg via ORAL
  Filled 2019-04-11 (×15): qty 1

## 2019-04-11 MED ORDER — PROCHLORPERAZINE MALEATE 5 MG PO TABS: 5.0000 mg | ORAL_TABLET | Freq: Four times a day (QID) | ORAL | Status: DC | PRN

## 2019-04-11 MED ORDER — POLYETHYLENE GLYCOL 3350 17 G PO PACK: 17.0000 g | PACK | Freq: Every day | ORAL | Status: DC | PRN

## 2019-04-11 MED ORDER — SERTRALINE HCL 50 MG PO TABS
50.0000 mg | ORAL_TABLET | Freq: Every day | ORAL | Status: DC
  Administered 2019-04-12 – 2019-04-26 (×15): 50 mg via ORAL
  Filled 2019-04-11 (×15): qty 1

## 2019-04-11 MED ORDER — FLEET ENEMA 7-19 GM/118ML RE ENEM: 1.0000 | ENEMA | Freq: Once | RECTAL | Status: DC | PRN

## 2019-04-11 MED ORDER — AMIODARONE HCL 200 MG PO TABS
200.0000 mg | ORAL_TABLET | Freq: Every day | ORAL | Status: DC
  Administered 2019-04-12 – 2019-04-13 (×2): 200 mg via ORAL
  Filled 2019-04-11 (×2): qty 1

## 2019-04-11 MED ORDER — SODIUM CHLORIDE 0.9% FLUSH
10.0000 mL | INTRAVENOUS | Status: DC | PRN
  Administered 2019-04-13: 10 mL
  Filled 2019-04-11: qty 40

## 2019-04-11 MED ORDER — PROCHLORPERAZINE EDISYLATE 10 MG/2ML IJ SOLN: 5.0000 mg | Freq: Four times a day (QID) | INTRAMUSCULAR | Status: DC | PRN

## 2019-04-11 MED ORDER — ADULT MULTIVITAMIN W/MINERALS CH
1.0000 | ORAL_TABLET | Freq: Every day | ORAL | Status: DC
  Administered 2019-04-12 – 2019-04-26 (×15): 1 via ORAL
  Filled 2019-04-11 (×15): qty 1

## 2019-04-11 MED ORDER — SODIUM CHLORIDE 0.9% FLUSH
10.0000 mL | Freq: Two times a day (BID) | INTRAVENOUS | Status: DC
  Administered 2019-04-11 – 2019-04-14 (×7): 10 mL

## 2019-04-11 MED ORDER — LEVOTHYROXINE SODIUM 50 MCG PO TABS
50.0000 ug | ORAL_TABLET | Freq: Every day | ORAL | Status: DC
  Administered 2019-04-12 – 2019-04-26 (×15): 50 ug via ORAL
  Filled 2019-04-11 (×15): qty 1

## 2019-04-11 MED ORDER — SODIUM CHLORIDE 0.9 % IV SOLN
1.0000 g | INTRAVENOUS | Status: DC
  Administered 2019-04-11 – 2019-04-13 (×2): 1 g via INTRAVENOUS
  Filled 2019-04-11 (×2): qty 1

## 2019-04-11 MED ORDER — NON FORMULARY: 3.0000 mg | Freq: Every day | Status: DC

## 2019-04-11 MED ORDER — INSULIN ASPART 100 UNIT/ML ~~LOC~~ SOLN
0.0000 [IU] | Freq: Every day | SUBCUTANEOUS | Status: DC
  Administered 2019-04-18: 2 [IU] via SUBCUTANEOUS

## 2019-04-11 MED ORDER — ACETAMINOPHEN 325 MG PO TABS
325.0000 mg | ORAL_TABLET | ORAL | Status: DC | PRN
  Administered 2019-04-15 – 2019-04-23 (×3): 650 mg via ORAL
  Filled 2019-04-11 (×4): qty 2

## 2019-04-11 MED ORDER — ALUM & MAG HYDROXIDE-SIMETH 200-200-20 MG/5ML PO SUSP: 30.0000 mL | ORAL | Status: DC | PRN

## 2019-04-11 MED ORDER — MELATONIN 3 MG PO TABS
3.0000 mg | ORAL_TABLET | Freq: Every day | ORAL | Status: DC
  Administered 2019-04-11 – 2019-04-25 (×14): 3 mg via ORAL
  Filled 2019-04-11 (×16): qty 1

## 2019-04-11 MED ORDER — GUAIFENESIN-DM 100-10 MG/5ML PO SYRP: 5.0000 mL | ORAL_SOLUTION | Freq: Four times a day (QID) | ORAL | Status: DC | PRN

## 2019-04-11 MED ORDER — FAMOTIDINE 20 MG PO TABS
20.0000 mg | ORAL_TABLET | Freq: Two times a day (BID) | ORAL | Status: DC
  Administered 2019-04-11 – 2019-04-13 (×4): 20 mg via ORAL
  Filled 2019-04-11 (×4): qty 1

## 2019-04-11 MED ORDER — BISACODYL 10 MG RE SUPP: 10.0000 mg | Freq: Every day | RECTAL | Status: DC | PRN

## 2019-04-11 MED ORDER — PROCHLORPERAZINE 25 MG RE SUPP: 12.5000 mg | Freq: Four times a day (QID) | RECTAL | Status: DC | PRN

## 2019-04-11 MED ORDER — TAMSULOSIN HCL 0.4 MG PO CAPS
0.4000 mg | ORAL_CAPSULE | Freq: Every day | ORAL | Status: DC
  Administered 2019-04-11 – 2019-04-25 (×15): 0.4 mg via ORAL
  Filled 2019-04-11 (×15): qty 1

## 2019-04-11 MED ORDER — POTASSIUM CHLORIDE CRYS ER 20 MEQ PO TBCR
20.0000 meq | EXTENDED_RELEASE_TABLET | Freq: Every day | ORAL | Status: DC
  Administered 2019-04-11 – 2019-04-26 (×16): 20 meq via ORAL
  Filled 2019-04-11 (×16): qty 1

## 2019-04-11 MED ORDER — DIPHENHYDRAMINE HCL 12.5 MG/5ML PO ELIX: 12.5000 mg | ORAL_SOLUTION | Freq: Four times a day (QID) | ORAL | Status: DC | PRN

## 2019-04-11 MED ORDER — METOPROLOL TARTRATE 25 MG PO TABS
25.0000 mg | ORAL_TABLET | Freq: Two times a day (BID) | ORAL | Status: DC
  Administered 2019-04-11 – 2019-04-18 (×14): 25 mg via ORAL
  Filled 2019-04-11 (×14): qty 1

## 2019-04-11 MED ORDER — CHLORHEXIDINE GLUCONATE CLOTH 2 % EX PADS
6.0000 | MEDICATED_PAD | Freq: Every day | CUTANEOUS | Status: DC
  Administered 2019-04-11 – 2019-04-16 (×6): 6 via TOPICAL

## 2019-04-11 MED ORDER — DILTIAZEM HCL 60 MG PO TABS
60.0000 mg | ORAL_TABLET | Freq: Four times a day (QID) | ORAL | Status: DC
  Administered 2019-04-11 – 2019-04-14 (×11): 60 mg via ORAL
  Filled 2019-04-11 (×12): qty 1

## 2019-04-11 MED ORDER — QUETIAPINE FUMARATE 25 MG PO TABS
25.0000 mg | ORAL_TABLET | Freq: Three times a day (TID) | ORAL | Status: DC
  Administered 2019-04-11 – 2019-04-12 (×3): 25 mg via ORAL
  Filled 2019-04-11 (×4): qty 1

## 2019-04-11 MED ORDER — INSULIN ASPART 100 UNIT/ML ~~LOC~~ SOLN
0.0000 [IU] | Freq: Three times a day (TID) | SUBCUTANEOUS | Status: DC
  Administered 2019-04-13 – 2019-04-25 (×8): 1 [IU] via SUBCUTANEOUS

## 2019-04-11 MED ORDER — TRAZODONE HCL 50 MG PO TABS: 25.0000 mg | ORAL_TABLET | Freq: Every evening | ORAL | Status: DC | PRN

## 2019-04-11 MED ORDER — APIXABAN 2.5 MG PO TABS
2.5000 mg | ORAL_TABLET | Freq: Two times a day (BID) | ORAL | Status: DC
  Administered 2019-04-11 – 2019-04-26 (×30): 2.5 mg via ORAL
  Filled 2019-04-11 (×30): qty 1

## 2019-04-11 NOTE — Progress Notes (Addendum)
Pulmonary Critical Care Medicine [place]   PULMONARY CRITICAL CARE SERVICE  PROGRESS NOTE  Date of Service: 04/11/2019  JARVELL PYON  [id number]  DOB: 1950-12-01   DOA: 04/11/2019  Referring Physician: Merton Border, MD  HPI: Andrew Compton is a 68 y.o. male seen for follow up of Acute on Chronic Respiratory Failure.  Patient was capped and on room air at this time will be decannulated today.  Satting well at this time with no distress.  Medications: Reviewed on Rounds  Physical Exam:  Vitals: Pulse 86 respirations 15 BP 144/102 O2 sat 97% temp 96.5  Ventilator Settings capped on room air  . General: Comfortable at this time . Eyes: Grossly normal lids, irises & conjunctiva . ENT: grossly tongue is normal . Neck: no obvious mass . Cardiovascular: S1 S2 normal no gallop . Respiratory: No rales or rhonchi noted . Abdomen: soft . Skin: no rash seen on limited exam . Musculoskeletal: not rigid . Psychiatric:unable to assess . Neurologic: no seizure no involuntary movements         Lab Data:   Basic Metabolic Panel: Recent Labs  Lab 04/09/19 0401  NA 138  K 3.1*  CL 99  CO2 29  GLUCOSE 122*  BUN 14  CREATININE 0.82  CALCIUM 9.1    ABG: No results for input(s): PHART, PCO2ART, PO2ART, HCO3, O2SAT in the last 168 hours.  Liver Function Tests: Recent Labs  Lab 04/09/19 0401  AST 16  ALT 14  ALKPHOS 99  BILITOT 0.6  PROT 6.3*  ALBUMIN 2.6*   No results for input(s): LIPASE, AMYLASE in the last 168 hours. No results for input(s): AMMONIA in the last 168 hours.  CBC: Recent Labs  Lab 04/09/19 0401  WBC 5.6  HGB 10.1*  HCT 31.8*  MCV 95.5  PLT 177    Cardiac Enzymes: No results for input(s): CKTOTAL, CKMB, CKMBINDEX, TROPONINI in the last 168 hours.  BNP (last 3 results) No results for input(s): BNP in the last 8760 hours.  ProBNP (last 3 results) No results for input(s): PROBNP in the last 8760  hours.  Radiological Exams: US Renal  Result Date: 04/10/2019 CLINICAL DATA:  68 year old male with hematuria. EXAM: RENAL / URINARY TRACT ULTRASOUND COMPLETE COMPARISON:  Renal ultrasound 03/31/2019. FINDINGS: Right Kidney: Renal measurements: 10.5 x 5.6 x 6.4 centimeters = volume: 183 mL . Echogenicity within normal limits. No mass or hydronephrosis visualized. Left Kidney: Renal measurements: 10.9 x 5.9 x 7.6 centimeters = volume: 255 mL. Better visualized than on the August exam although still mildly obscured by bowel gas. Cortical thickness and echogenicity appear normal. No left hydronephrosis or renal mass is evident. Bladder: Foley catheter balloon in place (image 22) with decompressed urinary bladder and generalized bladder wall thickening. Other findings: Prostate with a diameter of 54 millimeters and estimated volume of 73 milliliters. IMPRESSION: 1. Bladder remains decompressed by a Foley catheter and appears thick walled as before. 2. Negative ultrasound appearance of both kidneys. 3. Prostate enlargement with estimated volume of 73 mL. Electronically Signed   By: Genevie Ann M.D.   On: 04/10/2019 09:57    Assessment/Plan Active Problems:   Physical debility   1. Acute on chronic respiratory failure with hypoxiapatient remains capped on room air will be decannulated today continue supportive measures and pulmonary toilet. 2. Pseudomonas pneumonia treated we will continue with present management 3. Chronic atrial fibrillation rate controlled 4. Acute pancreatitis grossly unchanged 5. Metabolic encephalopathy unchanged we will continue  supportive care 6. Severe sepsis resolved hemodynamics are stable   I have personally seen and evaluated the patient, evaluated laboratory and imaging results, formulated the assessment and plan and placed orders. The Patient requires high complexity decision making for assessment and support.  Case was discussed on Rounds with the Respiratory Therapy  Staff  Allyne Gee, MD [facility] Pulmonary Critical Care Medicine Sleep Medicine

## 2019-04-11 NOTE — PMR Pre-admission (Shared)
PMR Admission Coordinator Pre-Admission Assessment  Patient: Andrew Compton is an 68 y.o., male MRN: [id number] DOB: 07-Nov-1950 Height: 6' (1.829 m) Weight: 94.3 kg  Insurance Information HMO:     PPO:      PCP:      IPA:      80/20:      OTHER:  PRIMARY: Medicare a and b      Policy#: [id number]      Subscriber: pt Benefits:  Phone #: passport one     Name: 04/11/2019 Eff. Date: 05/03/2015     Deduct: $1408      Out of Pocket Max: none      Life Max: none CIR: 100%      SNF: 20 full days Outpatient: 80%     Co-Pay: 20% Home Health: 100%      Co-Pay: none DME: 80%     Co-Pay: 20% Providers: pt choice  SECONDARY: [employer]      Policy#: [id number]      Subscriber: pt  Medicaid Application Date:       Case Manager:  Disability Application Date:       Case Worker:   The "Data Collection Information Summary" for patients in Inpatient Rehabilitation Facilities with attached "Privacy Act [place] Records" was provided and verbally reviewed with: Patient  Emergency Contact Information Contact Information    Name Relation Home Work [place] Spouse [phone]   No name specified          Current Medical History  Patient Admitting Diagnosis: Debility, respiratory failure  History of Present Illness: 68 year old male with medical history including hypertension, type 2 Diabetes, atrial fibrillation on Eliquis and GERD. Presented to [facility] on February 20, 2019 with complaints of abdominal pain, nausea and abdominal distention. CT abdomen and pelvis demonstrated acute pancreatitis. Patient on vacation and reportedly drank a few beers, but does not drink in excess regularly. Elevated bilirubin and was hypoxic on arrival to the ED.  Began empiric meropenem. Pt became hypotensive with increase work of breathing requiring Levophed and BIPAP. He continued to decompensate and required intubation on July 21 st. Initial  blood cultures were gram negative rods later determined to be Klebsiella pneumonia. There was concern for the possibility of acute cholangitis due to gallstones noted on CT abdomen and pelvis however MRCP on 7/25 demonstrated no evidence . 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 Respiratory distress syndrome likely secondary to sepsis and septic shock on admission. On lovenox for the portal vein thrombosis. Broad spectrum antibiotics lastly meropenem for sputum cultures demonstrating pseudomonas. Pt with encephalopathy and underwent PEG and trach placement on 8/12. Pt on amiodarone and diltiazem for his atrial fibrillation.   Patient admitted to [place] on 03/16/2019. Pt remained on Meropenem until 8/16. Heart rate difficult to control initially and Cardizem increased. Malnutrition managed with PEG feeds. Continued on Lovenox and then switched to Eliquis, which was withheld twice due to hematuria. Hematuria felt to be traumatic in origin. COude catheter. Renal ultrasounds twice were negative. Patient weaned from ventilator and de cannulated trach. Now on room air. MBS completed 9/4 and pt began on regular diet with thin liquids. Encephalopathy resolved.     Patient's medical record from [facility] has been reviewed by the rehabilitation admission coordinator and physician.  Past Medical History  Past Medical History:  Diagnosis Date  . Acute on chronic respiratory failure  with hypoxia ([place])   . Acute pancreatitis with uninfected necrosis, unspecified   . Chronic atrial fibrillation   . Essential hypertension, benign   . GERD (gastroesophageal reflux disease)   . Gouty arthropathy, unspecified   . Hypothyroidism   . Metabolic encephalopathy   . Other and unspecified hyperlipidemia   . Paroxysmal atrial fibrillation (HCC)   . Pneumonia due to Pseudomonas aeruginosa ([place])   . Severe sepsis ([place])     Family History   family history  includes Breast cancer in his mother; Other in his father.  Prior Rehab/Hospitalizations Has the patient had prior rehab or hospitalizations prior to admission? Yes  Has the patient had major surgery during 100 days prior to admission? Yes   Current Medications Potassium Humalog Amantadine Amiodarone Diltiazem Famotidine Fish oil Levothyroxine Melatonin Metoprolol Modafinil Seroquel Sertraline Flomax Thera m plus  Patients Current Diet:  Regular with thin liquids. MBS 04/06/19  Precautions / Restrictions Fall precautions; patient states left leg weaker and buckles since at [facility]  Has the patient had 2 or more falls or a fall with injury in the past year? No  Prior Activity Level Retired, independent; drives, active outside mowing, etc  Prior Functional Level Self Care: Did the patient need help bathing, dressing, using the toilet or eating? Independent  Indoor Mobility: Did the patient need assistance with walking from room to room (with or without device)? Independent  Stairs: Did the patient need assistance with internal or external stairs (with or without device)? Independent  Functional Cognition: Did the patient need help planning regular tasks such as shopping or remembering to take medications? Independent  Home Assistive Devices / Equipment    Prior Device Use: Indicate devices/aids used by the patient prior to current illness, exacerbation or injury? None of the above   Prior Functional Level Current Functional Level  Bed Mobility  Independent Supine to EOB min assist with siderails   Transfers  Independent  Sit to stand mod assist   Mobility - Walk/Wheelchair  Independent  Takes 2 to 5 steps min assist with left leg weaker and buckles to recliner. Heart rate increases with activity. On room air   Upper Body Dressing  Independent  min   Lower Body Dressing  Independent  max   Grooming  Independent  min   Eating/Drinking  Independent   Set up   Toilet Transfer  Independent  mod   Bladder Continence   continent  Coude catheter   Bowel Management  continent  Continent LBM 9/8   Stair Climbing  Independent  Not attempted   Communication  Intact; Independent  independent   Memory  Intact Intact; does not remember after admitted to hospital stay [place] until he arrived to [facility]   Driving  yes     Special needs/care consideration BiPAP/CPAP n/a CPM  N/a Continuous Drip IV  N/a Dialysis n/a Life Vest  N/a Oxygen room air Special Bed n/a Trach Size  De cannulated; Band-Aid to trach site Wound Vac n/a Skin  Right anterior leg abrasion 3x.5 with scab; right medial lower leg 3.0 x 6.0 blister; right 1st metatarsal head slow to blanch; right heel slow to blanche; bilateral buttocks pink beefy tissue; left heel pink beefy tissue Bowel mgmt: continent LBM 9/8 Bladder mgmt: coude catheter; UTI on 9/8 on [facility] Diabetic mgmt:  yes Behavioral consideration  N/a Chemo/radiation  N/a Wife, Clarene Critchley to be visitor designee   Previous Home Environment  One level home with level entry; bathroom with  tub /shower with door, handicapped height toilet, walker access to bathroom.Lives with wife, both retired and active   Discharge Living Setting Same as pta  Many Lives with wife, Clarene Critchley. She can provide 24/7 assist. Cell [phone]  Goals/Additional Needs Mod I to supervision with PT, OT, and SLP ELOS 10 to 14 days  Decrease burden of Care through IP rehab admission: n/a  Possible need for SNF placement upon discharge: not anticipated  Patient Condition: I have reviewed medical records from [facility] , spoken with CSW, and patient. I met with patient at the bedside for inpatient rehabilitation assessment.  Patient will benefit from ongoing PT, OT and SLP, can actively participate in 3 hours of therapy a day 5 days of the week, and can make measurable gains  during the admission.  Patient will also benefit from the coordinated team approach during an Inpatient Acute Rehabilitation admission.  The patient will receive intensive therapy as well as Rehabilitation physician, nursing, social worker, and care management interventions.  Due to bladder management, bowel management, safety, skin/wound care, disease management, medication administration, pain management and patient education the patient requires 24 hour a day rehabilitation nursing.  The patient is currently min to mod assist with mobility and basic ADLs.  Discharge setting and therapy post discharge at home with home health is anticipated.  Patient has agreed to participate in the Acute Inpatient Rehabilitation Program and will admit today.  Preadmission Screen Completed By:  Cleatrice Burke, 04/11/2019 10:37 AM ______________________________________________________________________   Discussed status with Dr. Aretta Nip on  04/11/2019 at 1125 and received approval for admission today.  Admission Coordinator:  Cleatrice Burke, RN MSN time 1125 date 04/11/2019  Assessment/Plan: Diagnosis: 1. Does the need for close, 24 hr/day Medical supervision in concert with the patient's rehab needs make it unreasonable for this patient to be served in a less intensive setting? {yes_no_potentially:3041433} 2. Co-Morbidities requiring supervision/potential complications: *** 3. Due to {due MK:3491791}, does the patient require 24 hr/day rehab nursing? {yes_no_potentially:3041433} 4. Does the patient require coordinated care of a physician, rehab nurse, {coordinated TAVW:9794801} to address physical and functional deficits in the context of the above medical diagnosis(es)? {yes_no_potentially:3041433} Addressing deficits in the following areas: {deficits:3041436} 5. Can the patient actively participate in an intensive therapy program of at least 3 hrs of therapy 5 days a week?  {yes_no_potentially:3041433} 6. The potential for patient to make measurable gains while on inpatient rehab is {potential:3041437} 7. Anticipated functional outcomes upon discharge from inpatients are: {functional outcomes:304600100} PT, {functional outcomes:304600100} OT, {functional outcomes:304600100} SLP 8. Estimated rehab length of stay to reach the above functional goals is: *** 9. Anticipated D/C setting: {anticipated dc setting:21604} 10. Anticipated post D/C treatments: {post dc treatment:21605} 11. Overall Rehab/Functional Prognosis: {potential:3041437}  MD Signature ***

## 2019-04-11 NOTE — Progress Notes (Signed)
Andrew Compton, Andrew Salk, MD  Physician  Physical Medicine and Rehabilitation  PMR Pre-admission  Signed  Date of Service:  04/11/2019 11:34 AM      Related encounter: Admission (Current) from 03/16/2019 in [place] all [x] Manual[x] Template[x] Copied  Added by: [x] Andrew Compton, Andrew Salk, MD  [] Hover for details PMR Admission Coordinator Pre-Admission Assessment  Patient: Andrew Compton is an 68 y.o., male MRN: [id number] DOB: 1950-08-04 Height: 6' (1.829 m) Weight: 94.3 kg  Insurance Information HMO:     PPO:      PCP:      IPA:      80/20:      OTHER:  PRIMARY: Medicare a and b      Policy#: [id number]      Subscriber: pt Benefits:  Phone #: passport one     Name: 04/11/2019 Eff. Date: 05/03/2015     Deduct: $1408      Out of Pocket Max: none      Life Max: none CIR: 100%      SNF: 20 full days Outpatient: 80%     Co-Pay: 20% Home Health: 100%      Co-Pay: none DME: 80%     Co-Pay: 20% Providers: pt choice  SECONDARY: [employer]      Policy#: [id number]      Subscriber: pt  Medicaid Application Date:       Case Manager:  Disability Application Date:       Case Worker:   The "Data Collection Information Summary" for patients in Inpatient Rehabilitation Facilities with attached "Privacy Act [place] Records" was provided and verbally reviewed with: Patient  Emergency Contact Information         Contact Information    Name Relation Home Work Mobile   [place] Spouse [phone]  [phone]      Current Medical History  Patient Admitting Diagnosis: Debility, respiratory failure  History of Present Illness: 68 year old male with medical history including hypertension, type 2 Diabetes, atrial fibrillation on Eliquis and GERD. Presented to [facility] on February 20, 2019 with complaints of abdominal pain, nausea and abdominal distention. CT abdomen and pelvis demonstrated acute  pancreatitis. Patient on vacation and reportedly drank a few beers, but does not drink in excess regularly. Elevated bilirubin and was hypoxic on arrival to the ED.  Began empiric meropenem. Pt became hypotensive with increase work of breathing requiring Levophed and BIPAP. He continued to decompensate and required intubation on July 21 st. Initial blood cultures were gram negative rods later determined to be Klebsiella pneumonia. There was concern for the possibility of acute cholangitis due to gallstones noted on CT abdomen and pelvis however MRCP on 7/25 demonstrated no evidence . 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 Respiratory distress syndrome likely secondary to sepsis and septic shock on admission. On lovenox for the portal vein thrombosis. Broad spectrum antibiotics lastly meropenem for sputum cultures demonstrating pseudomonas. Pt with encephalopathy and underwent PEG and trach placement on 8/12. Pt on amiodarone and diltiazem for his atrial fibrillation.   Patient admitted to [place] on 03/16/2019. Pt remained on Meropenem until 8/16. Heart rate difficult to control initially and Cardizem increased. Malnutrition managed with PEG feeds. Continued on Lovenox and then switched to Eliquis, which was withheld twice due to hematuria. Hematuria felt to be traumatic in origin. COude catheter. Renal ultrasounds twice were  negative. Patient weaned from ventilator and de cannulated trach. Now on room air. MBS completed 9/4 and pt began on regular diet with thin liquids. Encephalopathy resolved.     Patient's medical record from [facility] has been reviewed by the rehabilitation admission coordinator and physician.  Past Medical History      Past Medical History:  Diagnosis Date  . Acute on chronic respiratory failure with hypoxia ([place])   . Acute pancreatitis with uninfected necrosis, unspecified   . Chronic atrial fibrillation    . Essential hypertension, benign   . GERD (gastroesophageal reflux disease)   . Gouty arthropathy, unspecified   . Hypothyroidism   . Melanoma ([place])   . Metabolic encephalopathy   . Other and unspecified hyperlipidemia   . Paroxysmal atrial fibrillation (HCC)   . Pneumonia due to Pseudomonas aeruginosa ([place])   . Severe sepsis ([place])     Family History   family history includes Breast cancer in his mother; Diabetes in his father; Other in his father.  Prior Rehab/Hospitalizations Has the patient had prior rehab or hospitalizations prior to admission? Yes  Has the patient had major surgery during 100 days prior to admission? Yes             Current Medications Potassium Humalog Amantadine Amiodarone Diltiazem Famotidine Fish oil Levothyroxine Melatonin Metoprolol Modafinil Seroquel Sertraline Flomax Thera m plus  Patients Current Diet:  Regular with thin liquids. MBS 04/06/19  Precautions / Restrictions Fall precautions; patient states left leg weaker and buckles since at [facility]  Has the patient had 2 or more falls or a fall with injury in the past year? No  Prior Activity Level Retired, independent; drives, active outside mowing, etc  Prior Functional Level Self Care: Did the patient need help bathing, dressing, using the toilet or eating? Independent  Indoor Mobility: Did the patient need assistance with walking from room to room (with or without device)? Independent  Stairs: Did the patient need assistance with internal or external stairs (with or without device)? Independent  Functional Cognition: Did the patient need help planning regular tasks such as shopping or remembering to take medications? Independent  Home Assistive Devices / Equipment    Prior Device Use: Indicate devices/aids used by the patient prior to current illness, exacerbation or injury? None of the above   Prior Functional Level Current Functional Level  Bed  Mobility  Independent Supine to EOB min assist with siderails   Transfers  Independent  Sit to stand mod assist   Mobility - Walk/Wheelchair  Independent  Takes 2 to 5 steps min assist with left leg weaker and buckles to recliner. Heart rate increases with activity. On room air   Upper Body Dressing  Independent  min   Lower Body Dressing  Independent  max   Grooming  Independent  min   Eating/Drinking  Independent  Set up   Toilet Transfer  Independent  mod   Bladder Continence   continent  Coude catheter   Bowel Management  continent  Continent LBM 9/8   Stair Climbing  Independent  Not attempted   Communication  Intact; Independent  independent   Memory  Intact Intact; does not remember after admitted to hospital stay [place] until he arrived to [facility]   Driving  yes     Special needs/care consideration BiPAP/CPAP n/a CPM  N/a Continuous Drip IV  N/a Dialysis n/a Life Vest  N/a Oxygen room air Special Bed n/a Trach Size  De cannulated; Band-Aid to  trach site Wound Vac n/a Skin  Right anterior leg abrasion 3x.5 with scab; right medial lower leg 3.0 x 6.0 blister; right 1st metatarsal head slow to blanch; right heel slow to blanche; bilateral buttocks pink beefy tissue; left heel pink beefy tissue Bowel mgmt: continent LBM 9/8 Bladder mgmt: coude catheter; UTI on 9/8 on [facility] Diabetic mgmt:  yes Behavioral consideration  N/a Chemo/radiation  N/a Wife, Clarene Critchley to be visitor designee   Previous Home Environment  One level home with level entry; bathroom with tub /shower with door, handicapped height toilet, walker access to bathroom.Lives with wife, both retired and active   Discharge Living Setting Same as pta  [place] Lives with wife, Clarene Critchley. She can provide 24/7 assist. Cell [phone]  Goals/Additional Needs Mod I to supervision with PT, OT, and SLP ELOS  10 to 14 days  Decrease burden of Care through IP rehab admission: n/a  Possible need for SNF placement upon discharge: not anticipated  Patient Condition: I have reviewed medical records from [facility] , spoken with CSW, and patient. I met with patient at the bedside for inpatient rehabilitation assessment.  Patient will benefit from ongoing PT, OT and SLP, can actively participate in 3 hours of therapy a day 5 days of the week, and can make measurable gains during the admission.  Patient will also benefit from the coordinated team approach during an Inpatient Acute Rehabilitation admission.  The patient will receive intensive therapy as well as Rehabilitation physician, nursing, social worker, and care management interventions.  Due to bladder management, bowel management, safety, skin/wound care, disease management, medication administration, pain management and patient education the patient requires 24 hour a day rehabilitation nursing.  The patient is currently min to mod assist with mobility and basic ADLs.  Discharge setting and therapy post discharge at home with home health is anticipated.  Patient has agreed to participate in the Acute Inpatient Rehabilitation Program and will admit today.  Preadmission Screen Completed By:  Charlett Blake, 04/11/2019 11:34 AM ______________________________________________________________________   Discussed status with Dr. Aretta Nip on  04/11/2019 at 1125 and received approval for admission today.  Admission Coordinator:  Charlett Blake, MD MSN time 1125 date 04/11/2019  Assessment/Plan: Diagnosis:debility  1. Does the need for close, 24 hr/day Medical supervision in concert with the patient's rehab needs make it unreasonable for this patient to be served in a less intensive setting? Yes 2. Co-Morbidities requiring supervision/potential complications: portal vein thrombosis, chronic atrial fib, HTN 3. Due to bladder management,  bowel management, safety, skin/wound care, disease management, medication administration, pain management and patient education, does the patient require 24 hr/day rehab nursing? Yes 4. Does the patient require coordinated care of a physician, rehab nurse, PT (1-2 hrs/day, 5 days/week), OT (1-2 hrs/day, 5 days/week) and SLP (.5-1 hrs/day, 5 days/week) to address physical and functional deficits in the context of the above medical diagnosis(es)? Yes Addressing deficits in the following areas: balance, endurance, locomotion, strength, transferring, bowel/bladder control, bathing, dressing, feeding, grooming, toileting, swallowing and psychosocial support 5. Can the patient actively participate in an intensive therapy program of at least 3 hrs of therapy 5 days a week? Yes 6. The potential for patient to make measurable gains while on inpatient rehab is good 7. Anticipated functional outcomes upon discharge from inpatients are: supervision PT, supervision OT, supervision SLP 8. Estimated rehab length of stay to reach the above functional goals is: 10-14d 9. Anticipated D/C setting: Home 10. Anticipated post D/C treatments: HH therapy  11. Overall Rehab/Functional Prognosis: good  MD [facility] Charlett Blake M.D. [facility] FAAPM&R (Sports Med, Neuromuscular Med) Diplomate Am Board of Electrodiagnostic Med

## 2019-04-11 NOTE — PMR Pre-admission (Signed)
PMR Admission Coordinator Pre-Admission Assessment  Patient: Andrew Compton is an 68 y.o., male MRN: [id number] DOB: Aug 10, 1950 Height: 6' (1.829 m) Weight: 94.3 kg  Insurance Information HMO:     PPO:      PCP:      IPA:      80/20:      OTHER:  PRIMARY: Medicare a and b      Policy#: [id number]      Subscriber: pt Benefits:  Phone #: passport one     Name: 04/11/2019 Eff. Date: 05/03/2015     Deduct: $1408      Out of Pocket Max: none      Life Max: none CIR: 100%      SNF: 20 full days Outpatient: 80%     Co-Pay: 20% Home Health: 100%      Co-Pay: none DME: 80%     Co-Pay: 20% Providers: pt choice  SECONDARY: [employer]      Policy#: [id number]      Subscriber: pt  Medicaid Application Date:       Case Manager:  Disability Application Date:       Case Worker:   The "Data Collection Information Summary" for patients in Inpatient Rehabilitation Facilities with attached "Privacy Act [place] Records" was provided and verbally reviewed with: Patient  Emergency Contact Information Contact Information    Name Relation Home Work Mobile   [place] Spouse [phone]  [phone]      Current Medical History  Patient Admitting Diagnosis: Debility, respiratory failure  History of Present Illness: 68 year old male with medical history including hypertension, type 2 Diabetes, atrial fibrillation on Eliquis and GERD. Presented to [facility] on February 20, 2019 with complaints of abdominal pain, nausea and abdominal distention. CT abdomen and pelvis demonstrated acute pancreatitis. Patient on vacation and reportedly drank a few beers, but does not drink in excess regularly. Elevated bilirubin and was hypoxic on arrival to the ED.  Began empiric meropenem. Pt became hypotensive with increase work of breathing requiring Levophed and BIPAP. He continued to decompensate and required intubation on July 21 st. Initial blood cultures were gram negative rods  later determined to be Klebsiella pneumonia. There was concern for the possibility of acute cholangitis due to gallstones noted on CT abdomen and pelvis however MRCP on 7/25 demonstrated no evidence . 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 Respiratory distress syndrome likely secondary to sepsis and septic shock on admission. On lovenox for the portal vein thrombosis. Broad spectrum antibiotics lastly meropenem for sputum cultures demonstrating pseudomonas. Pt with encephalopathy and underwent PEG and trach placement on 8/12. Pt on amiodarone and diltiazem for his atrial fibrillation.   Patient admitted to [facility] on 03/16/2019. Pt remained on Meropenem until 8/16. Heart rate difficult to control initially and Cardizem increased. Malnutrition managed with PEG feeds. Continued on Lovenox and then switched to Eliquis, which was withheld twice due to hematuria. Hematuria felt to be traumatic in origin. COude catheter. Renal ultrasounds twice were negative. Patient weaned from ventilator and de cannulated trach. Now on room air. MBS completed 9/4 and pt began on regular diet with thin liquids. Encephalopathy resolved.     Patient's medical record from [facility] has been reviewed by the rehabilitation admission coordinator and physician.  Past Medical History  Past Medical History:  Diagnosis Date  . Acute on chronic respiratory failure with hypoxia ([place])   . Acute pancreatitis with  uninfected necrosis, unspecified   . Chronic atrial fibrillation   . Essential hypertension, benign   . GERD (gastroesophageal reflux disease)   . Gouty arthropathy, unspecified   . Hypothyroidism   . Melanoma ([place])   . Metabolic encephalopathy   . Other and unspecified hyperlipidemia   . Paroxysmal atrial fibrillation (HCC)   . Pneumonia due to Pseudomonas aeruginosa ([place])   . Severe sepsis ([place])     Family History   family history includes Breast  cancer in his mother; Diabetes in his father; Other in his father.  Prior Rehab/Hospitalizations Has the patient had prior rehab or hospitalizations prior to admission? Yes  Has the patient had major surgery during 100 days prior to admission? Yes   Current Medications Potassium Humalog Amantadine Amiodarone Diltiazem Famotidine Fish oil Levothyroxine Melatonin Metoprolol Modafinil Seroquel Sertraline Flomax Thera m plus  Patients Current Diet:  Regular with thin liquids. MBS 04/06/19  Precautions / Restrictions Fall precautions; patient states left leg weaker and buckles since at [facility]  Has the patient had 2 or more falls or a fall with injury in the past year? No  Prior Activity Level Retired, independent; drives, active outside mowing, etc  Prior Functional Level Self Care: Did the patient need help bathing, dressing, using the toilet or eating? Independent  Indoor Mobility: Did the patient need assistance with walking from room to room (with or without device)? Independent  Stairs: Did the patient need assistance with internal or external stairs (with or without device)? Independent  Functional Cognition: Did the patient need help planning regular tasks such as shopping or remembering to take medications? Independent  Home Assistive Devices / Equipment    Prior Device Use: Indicate devices/aids used by the patient prior to current illness, exacerbation or injury? None of the above   Prior Functional Level Current Functional Level  Bed Mobility  Independent Supine to EOB min assist with siderails   Transfers  Independent  Sit to stand mod assist   Mobility - Walk/Wheelchair  Independent  Takes 2 to 5 steps min assist with left leg weaker and buckles to recliner. Heart rate increases with activity. On room air   Upper Body Dressing  Independent  min   Lower Body Dressing  Independent  max   Grooming  Independent  min   Eating/Drinking   Independent  Set up   Toilet Transfer  Independent  mod   Bladder Continence   continent  Coude catheter   Bowel Management  continent  Continent LBM 9/8   Stair Climbing  Independent  Not attempted   Communication  Intact; Independent  independent   Memory  Intact Intact; does not remember after admitted to hospital stay [place] until he arrived to [facility]   Driving  yes     Special needs/care consideration BiPAP/CPAP n/a CPM  N/a Continuous Drip IV  N/a Dialysis n/a Life Vest  N/a Oxygen room air Special Bed n/a Trach Size  De cannulated; Band-Aid to trach site Wound Vac n/a Skin  Right anterior leg abrasion 3x.5 with scab; right medial lower leg 3.0 x 6.0 blister; right 1st metatarsal head slow to blanch; right heel slow to blanche; bilateral buttocks pink beefy tissue; left heel pink beefy tissue Bowel mgmt: continent LBM 9/8 Bladder mgmt: coude catheter; UTI on 9/8 on [facility] Diabetic mgmt:  yes Behavioral consideration  N/a Chemo/radiation  N/a Wife, Clarene Critchley to be visitor designee   Previous Home Environment  One level home with level entry; bathroom with  tub /shower with door, handicapped height toilet, walker access to bathroom.Lives with wife, both retired and active   Discharge Living Setting Same as pta  [place] Lives with wife, Clarene Critchley. She can provide 24/7 assist. Cell [phone]  Goals/Additional Needs Mod I to supervision with PT, OT, and SLP ELOS 10 to 14 days  Decrease burden of Care through IP rehab admission: n/a  Possible need for SNF placement upon discharge: not anticipated  Patient Condition: I have reviewed medical records from [facility] , spoken with CSW, and patient. I met with patient at the bedside for inpatient rehabilitation assessment.  Patient will benefit from ongoing PT, OT and SLP, can actively participate in 3 hours of therapy a day 5 days of the week, and can make  measurable gains during the admission.  Patient will also benefit from the coordinated team approach during an Inpatient Acute Rehabilitation admission.  The patient will receive intensive therapy as well as Rehabilitation physician, nursing, social worker, and care management interventions.  Due to bladder management, bowel management, safety, skin/wound care, disease management, medication administration, pain management and patient education the patient requires 24 hour a day rehabilitation nursing.  The patient is currently min to mod assist with mobility and basic ADLs.  Discharge setting and therapy post discharge at home with home health is anticipated.  Patient has agreed to participate in the Acute Inpatient Rehabilitation Program and will admit today.  Preadmission Screen Completed By:  Charlett Blake, 04/11/2019 11:34 AM ______________________________________________________________________   Discussed status with Dr. Aretta Nip on  04/11/2019 at 1125 and received approval for admission today.  Admission Coordinator:  Charlett Blake, MD MSN time 1125 date 04/11/2019  Assessment/Plan: Diagnosis:debility  1. Does the need for close, 24 hr/day Medical supervision in concert with the patient's rehab needs make it unreasonable for this patient to be served in a less intensive setting? Yes 2. Co-Morbidities requiring supervision/potential complications: portal vein thrombosis, chronic atrial fib, HTN 3. Due to bladder management, bowel management, safety, skin/wound care, disease management, medication administration, pain management and patient education, does the patient require 24 hr/day rehab nursing? Yes 4. Does the patient require coordinated care of a physician, rehab nurse, PT (1-2 hrs/day, 5 days/week), OT (1-2 hrs/day, 5 days/week) and SLP (.5-1 hrs/day, 5 days/week) to address physical and functional deficits in the context of the above medical diagnosis(es)? Yes Addressing deficits in  the following areas: balance, endurance, locomotion, strength, transferring, bowel/bladder control, bathing, dressing, feeding, grooming, toileting, swallowing and psychosocial support 5. Can the patient actively participate in an intensive therapy program of at least 3 hrs of therapy 5 days a week? Yes 6. The potential for patient to make measurable gains while on inpatient rehab is good 7. Anticipated functional outcomes upon discharge from inpatients are: supervision PT, supervision OT, supervision SLP 8. Estimated rehab length of stay to reach the above functional goals is: 10-14d 9. Anticipated D/C setting: Home 10. Anticipated post D/C treatments: [place] therapy 11. Overall Rehab/Functional Prognosis: good  MD [facility] Charlett Blake M.D. [facility] FAAPM&R (Sports Med, Neuromuscular Med) Diplomate Am Board of Electrodiagnostic Med

## 2019-04-11 NOTE — Progress Notes (Addendum)
Pulmonary Critical Care Medicine [place]   PULMONARY CRITICAL CARE SERVICE  PROGRESS NOTE  Date of Service: 04/11/2019  Andrew Compton  [id number]  DOB: 1950-12-17   DOA: 04/11/2019  Referring Physician: Merton Border, MD  HPI: Andrew Compton is a 68 y.o. male seen for follow up of Acute on Chronic Respiratory Failure.  Patient is capped and will be decannulated tomorrow.  Satting well this time no distress.  Medications: Reviewed on Rounds  Physical Exam:  Vitals: Pulse 68 respirations 24 BP 130/88 O2 sat 94% temp 98.3  Ventilator Settings room air  . General: Comfortable at this time . Eyes: Grossly normal lids, irises & conjunctiva . ENT: grossly tongue is normal . Neck: no obvious mass . Cardiovascular: S1 S2 normal no gallop . Respiratory: No rales or rhonchi noted . Abdomen: soft . Skin: no rash seen on limited exam . Musculoskeletal: not rigid . Psychiatric:unable to assess . Neurologic: no seizure no involuntary movements         Lab Data:   Basic Metabolic Panel: Recent Labs  Lab 04/09/19 0401  NA 138  K 3.1*  CL 99  CO2 29  GLUCOSE 122*  BUN 14  CREATININE 0.82  CALCIUM 9.1    ABG: No results for input(s): PHART, PCO2ART, PO2ART, HCO3, O2SAT in the last 168 hours.  Liver Function Tests: Recent Labs  Lab 04/09/19 0401  AST 16  ALT 14  ALKPHOS 99  BILITOT 0.6  PROT 6.3*  ALBUMIN 2.6*   No results for input(s): LIPASE, AMYLASE in the last 168 hours. No results for input(s): AMMONIA in the last 168 hours.  CBC: Recent Labs  Lab 04/09/19 0401  WBC 5.6  HGB 10.1*  HCT 31.8*  MCV 95.5  PLT 177    Cardiac Enzymes: No results for input(s): CKTOTAL, CKMB, CKMBINDEX, TROPONINI in the last 168 hours.  BNP (last 3 results) No results for input(s): BNP in the last 8760 hours.  ProBNP (last 3 results) No results for input(s): PROBNP in the last 8760 hours.  Radiological Exams: US Renal  Result Date:  04/10/2019 CLINICAL DATA:  69 year old male with hematuria. EXAM: RENAL / URINARY TRACT ULTRASOUND COMPLETE COMPARISON:  Renal ultrasound 03/31/2019. FINDINGS: Right Kidney: Renal measurements: 10.5 x 5.6 x 6.4 centimeters = volume: 183 mL . Echogenicity within normal limits. No mass or hydronephrosis visualized. Left Kidney: Renal measurements: 10.9 x 5.9 x 7.6 centimeters = volume: 255 mL. Better visualized than on the August exam although still mildly obscured by bowel gas. Cortical thickness and echogenicity appear normal. No left hydronephrosis or renal mass is evident. Bladder: Foley catheter balloon in place (image 22) with decompressed urinary bladder and generalized bladder wall thickening. Other findings: Prostate with a diameter of 54 millimeters and estimated volume of 73 milliliters. IMPRESSION: 1. Bladder remains decompressed by a Foley catheter and appears thick walled as before. 2. Negative ultrasound appearance of both kidneys. 3. Prostate enlargement with estimated volume of 73 mL. Electronically Signed   By: Genevie Ann M.D.   On: 04/10/2019 09:57    Assessment/Plan Active Problems:   Physical debility   1. Acute on chronic respiratory failure with hypoxia  patient is capped on room air satting well we will continue supportive measures and pulmonary toilet plan is for decannulation tomorrow. 2. Pseudomonas pneumonia treated we will continue with present management 3. Chronic atrial fibrillation rate controlled 4. Acute pancreatitis grossly unchanged 5. Metabolic encephalopathy unchanged we will continue supportive care 6.  Severe sepsis resolved hemodynamics are stable   I have personally seen and evaluated the patient, evaluated laboratory and imaging results, formulated the assessment and plan and placed orders. The Patient requires high complexity decision making for assessment and support.  Case was discussed on Rounds with the Respiratory Therapy Staff  Allyne Gee, MD  [facility] Pulmonary Critical Care Medicine Sleep Medicine

## 2019-04-11 NOTE — Progress Notes (Signed)
Pt arrived to unit via w/c. Transferred from w/c to bed via stand pivot. Pt is alert and oriented x 4, able to make needs known, foley intact and placed on 09/06 for urinary retention. PICC line intact and labs to be drawn DBIV, PEG to flushed with NS, VS charted, bed alarm on and functioning, call bell in reach.

## 2019-04-11 NOTE — H&P (Signed)
Physical Medicine and Rehabilitation Admission H&P     CC: Debilty.      HPI: Andrew Compton is a 68 year old male with history of T2DM, melanoma, GERD, gout, HTN, brachial plexus injury BUE, PAF- on eliquis who was admitted to [facility] on 02/20/19 with sever abdominal pain, nauseas and abdominal distension with CT abdomen revealing. acute pancreatitis. He was hypoxic on admission and continued to worsen requiring intubation as well as levophed due to septic shock. Hospital course complicated by acute respiratory failure with ARDS, severesepsis due to Pseudomonas bacteremia as well as pseudomonas PNA, A fib with flutter and encephalopathy. He was started on meropenum and pancreatitis treated with conservative care.  Follow up MRCP showed cholelithiasis without cholecystitis,  thrombosed left portal vein and improvement in pancreatic edema.  EEG done due to decrease in LOC showing nonspecific moderate encephalopathy. He required tracheostomy and PEG placement on 08/12 due to inability to wean off vent. He was transferred to [facility] on 03/16/19 for further management.   He completed his course of meropenum for recurrent Pseudomonas PNA and was able to tolerate vent wean. Hospital course significant for issues with agitation, code blue 8/25 with weaning attempts,  electrolyte abnormalities, hematuria as well as A fib with RVR. BB added and weaned off IV Cardizem but continues to have tachycardia with activity requiring prn IV lopressor. He tolerated capping of trach 9/3, was started on regular diet and decannulated by 9/6.  He has had issues with hematuria (pulling on foley) 8/29 and renal ultrasound showed decompressed bladder with thick wall. Foley d/c with attempts at voiding trials but he has had retention requiring I/O caths and recurrent bleeding since 9/6 therefore foley replaced. Follow up renal ultrasound 9/8 showed decompressed bladder with enlarged prostate and normal echogenicity.   Blood thinners have been held on and off due to hematuria----low dose Eliquis resumed on 09/08 and Rocephin added due to concerns of UTI.  Therapy has been ongoing and patient continues to be limited by debility affecting ADLs and mobility. CIR recommended due to functional decline.      Review of Systems  Constitutional: Negative for chills and fever.  HENT: Negative for hearing loss and tinnitus.   Eyes: Negative for blurred vision and double vision.  Respiratory: Negative for cough and shortness of breath.   Cardiovascular: Negative for chest pain and palpitations.  Gastrointestinal: Negative for abdominal pain, constipation, diarrhea, heartburn, nausea and vomiting.  Musculoskeletal: Negative for back pain and myalgias.  Skin: Negative for rash.  Neurological: Positive for weakness. Negative for dizziness and headaches.  Psychiatric/Behavioral: The patient is not nervous/anxious.             Past Medical History:  Diagnosis Date   Acute on chronic respiratory failure with hypoxia (HCC)     Acute pancreatitis with uninfected necrosis, unspecified     Chronic atrial fibrillation     Essential hypertension, benign     GERD (gastroesophageal reflux disease)     Gouty arthropathy, unspecified     Hypothyroidism     Metabolic encephalopathy     Other and unspecified hyperlipidemia     Paroxysmal atrial fibrillation (HCC)     Pneumonia due to Pseudomonas aeruginosa (HCC)     Severe sepsis [facility])            Past Surgical History:  Procedure Laterality Date   ESOPHAGEAL DILATION   01/15/2016    Procedure: ESOPHAGEAL DILATION;  Surgeon: Rogene Houston, MD;  [facility];  Service: Endoscopy;;   ESOPHAGOGASTRODUODENOSCOPY N/A 01/15/2016    Procedure: ESOPHAGOGASTRODUODENOSCOPY (EGD);  Surgeon: Rogene Houston, MD;  [facility];  Service: Endoscopy;  Laterality: N/A;  1030   FRACTURE SURGERY Left      wrist, multiple surgeries   NEUROLYSIS  MEDIAN NERVE Right 11/27/2013    Neurolysis Brachial Plexus   NO PAST SURGERIES       SHOULDER SURGERY Right            Family History  Problem Relation Age of Onset   Breast cancer Mother     Other Father          unknown death cause     Social History:  Married.  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 He reports that he quit smoking about 22 years ago. He has never used smokeless tobacco. He reports current alcohol use. He reports that he does not use drugs.          Allergies  Allergen Reactions   Azithromycin Hives   Penicillins Other (See Comments)      Reaction: unknown , allergy since child per patient report   Levophed [Norepinephrine Bitartrate]     Sulfa Antibiotics             Medications Prior to Admission  Medication Sig Dispense Refill   apixaban (ELIQUIS) 5 MG TABS tablet Take 1 tablet (5 mg total) by mouth 2 (two) times daily. 42 tablet 0   colchicine 0.6 MG tablet Take 0.6 mg by mouth daily. Patient takes as needed       diltiazem (TIAZAC) 360 MG 24 hr capsule Take 1 capsule (360 mg total) by mouth daily. 90 capsule 3   fluticasone (FLONASE) 50 MCG/ACT nasal spray Place 2 sprays into the nose as needed for rhinitis or allergies.        hydrochlorothiazide (HYDRODIURIL) 25 MG tablet Take 25 mg by mouth daily.       levothyroxine (SYNTHROID, LEVOTHROID) 50 MCG tablet Take 50 mcg by mouth daily.       lisinopril (PRINIVIL,ZESTRIL) 5 MG tablet Take 5 mg by mouth daily.        metFORMIN (GLUCOPHAGE-XR) 500 MG 24 hr tablet Take 500 mg by mouth daily with breakfast.        pantoprazole (PROTONIX) 40 MG tablet TAKE 1 TABLET TWICE A DAY BEFORE MEALS 180 tablet 3   tamsulosin (FLOMAX) 0.4 MG CAPS capsule Take 0.4 mg by mouth daily.       triamcinolone cream (KENALOG) 0.1 % Apply 1 application topically as needed (dry legs).          Drug Regimen Review  Drug regimen was reviewed and remains appropriate with no significant issues  identified   Home:     Functional History:     Functional Status:  Mobility: Min assist for transfers Min assist with slow cadence to ambulate 40' with RW       ADL: MIn assist for UB care Mod assist with LB care   Cognition:       Physical Exam: There were no vitals taken for this visit. Physical Exam  Nursing note and vitals reviewed. Constitutional: He is oriented to person, place, and time. He appears well-developed and well-nourished.  Neck:  Dry dressing on tracheal stoma  Cardiovascular: An irregularly irregular rhythm present. Tachycardia present.  Heart rate up to 130's with transfers.   Respiratory: He has decreased breath sounds.  Musculoskeletal:  General: Edema present.     Comments: 1+ edema LLE. Large dry scab right shin and denuded abrasion right medial calf.   Neurological: He is alert and oriented to person, place, and time.   Abdomen positive bowel sounds soft nontender palpation Musculoskeletal patient has upper extremity contracture in the left hand in MCP PIP DIP of all digits. Motor strength is 3- right deltoid, 4 in the biceps triceps grip 4 in the left deltoid, bicep, tricep, 3- finger flexors and extensors 4- bilateral hip flexors knee extensors ankle dorsiflexors bilaterally Sensation intact to light touch bilateral upper and lower limbs.    Lab Results Last 48 Hours        Results for orders placed or performed during the hospital encounter of 03/16/19 (from the past 48 hour(s))  SARS Coronavirus 2 [facility] order, Performed in [facility] hospital lab) Nasopharyngeal Nasopharyngeal Swab     Status: None    Collection Time: 04/10/19 11:17 AM    Specimen: Nasopharyngeal Swab  Result Value Ref Range    SARS Coronavirus 2 NEGATIVE NEGATIVE      Comment: (NOTE) If result is NEGATIVE SARS-CoV-2 target nucleic acids are NOT DETECTED. The SARS-CoV-2 RNA is generally detectable in upper and lower  respiratory specimens during the  acute phase of infection. The lowest  concentration of SARS-CoV-2 viral copies this assay can detect is 250  copies / mL. A negative result does not preclude SARS-CoV-2 infection  and should not be used as the sole basis for treatment or other  patient management decisions.  A negative result may occur with  improper specimen collection / handling, submission of specimen other  than nasopharyngeal swab, presence of viral mutation(s) within the  areas targeted by this assay, and inadequate number of viral copies  (<250 copies / mL). A negative result must be combined with clinical  observations, patient history, and epidemiological information. If result is POSITIVE SARS-CoV-2 target nucleic acids are DETECTED. The SARS-CoV-2 RNA is generally detectable in upper and lower  respiratory specimens dur ing the acute phase of infection.  Positive  results are indicative of active infection with SARS-CoV-2.  Clinical  correlation with patient history and other diagnostic information is  necessary to determine patient infection status.  Positive results do  not rule out bacterial infection or co-infection with other viruses. If result is PRESUMPTIVE POSTIVE SARS-CoV-2 nucleic acids MAY BE PRESENT.   A presumptive positive result was obtained on the submitted specimen  and confirmed on repeat testing.  While 2019 novel coronavirus  (SARS-CoV-2) nucleic acids may be present in the submitted sample  additional confirmatory testing may be necessary for epidemiological  and / or clinical management purposes  to differentiate between  SARS-CoV-2 and other Sarbecovirus currently known to infect humans.  If clinically indicated additional testing with an alternate test  methodology [phone]) is advised. The SARS-CoV-2 RNA is generally  detectable in upper and lower respiratory sp ecimens during the acute  phase of infection. The expected result is Negative. Fact Sheet for Patients:   StrictlyIdeas.no Fact Sheet for Healthcare Providers: BankingDealers.co.za This test is not yet approved or cleared by the [place] FDA and has been authorized for detection and/or diagnosis of SARS-CoV-2 by FDA under an Emergency Use Authorization (EUA).  This EUA will remain in effect (meaning this test can be used) for the duration of the COVID-19 declaration under Section 564(b)(1) of the Act, 21 U.S.C. section 360bbb-3(b)(1), unless the authorization is terminated or revoked sooner. Performed at  [facility], [place] [address], [place], [place] [address]        Imaging Results (Last 48 hours)  US Renal   Result Date: 04/10/2019 CLINICAL DATA:  68 year old male with hematuria. EXAM: RENAL / URINARY TRACT ULTRASOUND COMPLETE COMPARISON:  Renal ultrasound 03/31/2019. FINDINGS: Right Kidney: Renal measurements: 10.5 x 5.6 x 6.4 centimeters = volume: 183 mL . Echogenicity within normal limits. No mass or hydronephrosis visualized. Left Kidney: Renal measurements: 10.9 x 5.9 x 7.6 centimeters = volume: 255 mL. Better visualized than on the August exam although still mildly obscured by bowel gas. Cortical thickness and echogenicity appear normal. No left hydronephrosis or renal mass is evident. Bladder: Foley catheter balloon in place (image 22) with decompressed urinary bladder and generalized bladder wall thickening. Other findings: Prostate with a diameter of 54 millimeters and estimated volume of 73 milliliters. IMPRESSION: 1. Bladder remains decompressed by a Foley catheter and appears thick walled as before. 2. Negative ultrasound appearance of both kidneys. 3. Prostate enlargement with estimated volume of 73 mL. Electronically Signed   By: Genevie Ann M.D.   On: 04/10/2019 09:57            Medical Problem List and Plan: 1.    Debility secondary to Pseudomonas bacteremia and sepsis Initiate PT OT in a.m. 2.   Antithrombotics: -DVT/anticoagulation:  Pharmaceutical: Other (comment)--Eliquis             -antiplatelet therapy: N/A 3. Pain Management: N/A 4. Mood:LCSW to follow for evaluation and support.              -antipsychotic agents: Seroquel.  5. Neuropsych: This patient is capable of making decisions on his own behalf. 6. Skin/Wound Care: Routine pressure relief measures. Add protein supplement to promote healing.  7. Fluids/Electrolytes/Nutrition: Monitor I/O. Check lytes today--increase K dur if K continues to be low.  8. A fib with RVR: Will check EKG as continues to go in and out of flutter. Continue amiodarone, metoprolol and cardizem. Will consult cardiology for input as needed. 9. 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 Difficult cath due to BPH--needs coude cath. Continue flomax and will repeat trial in a few days. UCS negative--d/c rocephin after day # 3?  10. T2DM: Monitor BS ac/hs. Use SSI for elevated BS and resume metformin if BS trend upwards.  11. Encephalopathy: Has resolved on Seroquel. Wean as able.  12. Hypokalemia: K+ 3.1 on 9/7--> recheck labs today.  13. Anemia of chronic illness: Likely affected by intermittent hematuria. Will follow H/H with serial checks.      "I have personally performed a face to face diagnostic evaluation of this patient.  Additionally, I have reviewed and concur with the physician assistant's documentation above." Charlett Blake M.D. [facility] FAAPM&R (Sports Med, Neuromuscular Med) Diplomate Am Board of [place], PA-C 04/11/2019

## 2019-04-12 ENCOUNTER — Inpatient Hospital Stay (HOSPITAL_COMMUNITY): Payer: Medicare Other | Admitting: Occupational Therapy

## 2019-04-12 ENCOUNTER — Inpatient Hospital Stay (HOSPITAL_COMMUNITY): Payer: Medicare Other | Admitting: Physical Therapy

## 2019-04-12 ENCOUNTER — Inpatient Hospital Stay (HOSPITAL_COMMUNITY): Payer: Medicare Other | Admitting: Speech Pathology

## 2019-04-12 LAB — GLUCOSE, CAPILLARY
Glucose-Capillary: 110 mg/dL — ABNORMAL HIGH (ref 70–99)
Glucose-Capillary: 110 mg/dL — ABNORMAL HIGH (ref 70–99)
Glucose-Capillary: 99 mg/dL (ref 70–99)
Glucose-Capillary: 105 mg/dL — ABNORMAL HIGH (ref 70–99)

## 2019-04-12 MED ORDER — BIOTENE DRY MOUTH MT LIQD: 15.0000 mL | OROMUCOSAL | Status: DC | PRN

## 2019-04-12 MED ORDER — MAGNESIUM SULFATE 2 GM/50ML IV SOLN
2.0000 g | Freq: Once | INTRAVENOUS | Status: AC
  Administered 2019-04-12: 2 g via INTRAVENOUS
  Filled 2019-04-12: qty 50

## 2019-04-12 MED ORDER — QUETIAPINE FUMARATE 25 MG PO TABS
25.0000 mg | ORAL_TABLET | Freq: Two times a day (BID) | ORAL | Status: DC
  Administered 2019-04-12 – 2019-04-21 (×18): 25 mg via ORAL
  Filled 2019-04-12 (×18): qty 1

## 2019-04-12 MED ORDER — DICLOFENAC SODIUM 1 % TD GEL
2.0000 g | Freq: Four times a day (QID) | TRANSDERMAL | Status: DC
  Administered 2019-04-12 – 2019-04-13 (×5): 2 g via TOPICAL
  Filled 2019-04-12: qty 100

## 2019-04-12 NOTE — Progress Notes (Signed)
Patient information reviewed and entered into eRehab System by Becky Nanette Wirsing, PPS coordinator. Information including medical coding, function ability, and quality indicators will be reviewed and updated through discharge.   

## 2019-04-12 NOTE — Evaluation (Signed)
Speech Language Pathology Assessment and Plan  Patient Details  Name: Andrew Compton MRN: [id number] Date of Birth: 05-12-1951  Evaluation Only  Today's Date: 04/12/2019 SLP Individual Time: 0831-0927 SLP Individual Time Calculation (min): 56 min   Problem List:  Patient Active Problem List   Diagnosis Date Noted  . Physical debility 04/11/2019  . Acute on chronic respiratory failure with hypoxia ([place])   . Paroxysmal atrial fibrillation (HCC)   . Pneumonia due to Pseudomonas aeruginosa ([place])   . Chronic atrial fibrillation   . Acute pancreatitis with uninfected necrosis, unspecified   . Metabolic encephalopathy   . Severe sepsis ([place])   . Bruit 02/24/2015  . Chronic diastolic heart failure- Grade 2 05/03/2013  . PAF (paroxysmal atrial fibrillation) ([place]) 05/01/2013  . HTN (hypertension) 05/01/2013  . Gout 05/01/2013  . 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 Upper extremity weakness 04/06/2013  . GERD (gastroesophageal reflux disease) 01/17/2013  . Barrett's esophagus 01/25/2012  . Dysphagia 01/25/2012  . Mitral regurgitation 09/27/2011  . Paroxysmal atrial fibrillation (HCC)   . Left ventricular hypertrophy   . Essential hypertension, benign    Past Medical History:  Past Medical History:  Diagnosis Date  . Acute on chronic respiratory failure with hypoxia ([place])   . Acute pancreatitis with uninfected necrosis, unspecified   . Chronic atrial fibrillation   . Essential hypertension, benign   . GERD (gastroesophageal reflux disease)   . Gouty arthropathy, unspecified   . Hypothyroidism   . Melanoma ([place])   . Metabolic encephalopathy   . Other and unspecified hyperlipidemia   . Paroxysmal atrial fibrillation (HCC)   . Pneumonia due to Pseudomonas aeruginosa ([place])   . Severe sepsis [facility])    Past Surgical History:  Past Surgical History:  Procedure Laterality Date  . ESOPHAGEAL DILATION  01/15/2016   Procedure: ESOPHAGEAL DILATION;  Surgeon: Rogene Houston, MD;   [facility];  Service: Endoscopy;;  . ESOPHAGOGASTRODUODENOSCOPY N/A 01/15/2016   Procedure: ESOPHAGOGASTRODUODENOSCOPY (EGD);  Surgeon: Rogene Houston, MD;  [facility];  Service: Endoscopy;  Laterality: N/A;  1030  . FRACTURE SURGERY Left    wrist, multiple surgeries  . MELANOMA EXCISION Left 07/2018   groin  . NEUROLYSIS MEDIAN NERVE Right 11/27/2013   Neurolysis Brachial Plexus  . SHOULDER SURGERY Right     Assessment / Plan / Recommendation Clinical Impression   Andrew Compton is a 68 year old male with history of T2DM, melanoma, GERD, gout, HTN, brachial plexus injury BUE, PAF- on eliquis who was admitted [facility] on 02/20/19 withsevere abdominal pain, nauseas and abdominal distension with CT abdomen revealingacute pancreatitis. He was hypoxic on admission and continued to worsen requiring intubation as well as levophed due to septic shock. Hospital coursecomplicated by acute respiratory failure with ARDS, severe sepsis due to Pseudomonasbacteremiaas well aspseudomonas PNA, A fib with flutterand encephalopathy. He required tracheostomy and PEG placement on 08/12due to inability to wean off vent.He was transferred to [facility] on 03/16/19 for further management. Hospital course significant for issues with agitation, code blue 8/25 with weaning attempts. He tolerated capping of trach 9/3, was started on regular diet and decannulated by 9/6.  Therapy has been ongoing and patient continues to be limited by debility affecting ADLs and mobility. CIR recommended due to functional decline with admission on 04/11/19.   Comprehensive cognitive linguistic and bedside swallow evaluations completed on 04/12/19. Pt presents with functional oropharyngeal abilities. Pt is free of overt s/s of aspiration when consuming regular  and thin liquids. Pt self-reports annual follow-up with GI d/t Barrett's esophagus with occasional dilation. Pt able to recall general  GERD precautions. Currently, pt isn't experiencing any s/s related to Barrett's esophagus.   Pt's stoma is healed and his speech intelligibility is within functional limits.   Additionally, pt's cognitive abilities are considered functional. Pt obtained a score of 29 out of 30 on MOCA version 7.1 (n=>26). Pt demonstrates good insight into current physical deficits, is oriented x 4, able to recall salient information and demonstrates good complex problem solving/reasoning skills.   As a result of the above mentioned ability level, pt doesn't require any further skilled ST.    Skilled Therapeutic Interventions          Skilled treatment session focused on the completion of the above mentioned assessments, see above. Education completed with pt. ST to sign off.    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  The above assessment, treatment plan, treatment alternatives and goals were discussed and mutually agreed upon: by patient  Andrew Compton 04/12/2019, 9:46 AM

## 2019-04-12 NOTE — Evaluation (Signed)
Physical Therapy Assessment and Plan  Patient Details  Name: Andrew Compton MRN: [id number] Date of Birth: 25-Apr-1951  PT Diagnosis: Abnormal posture, Abnormality of gait, Difficulty walking and Muscle weakness Rehab Potential: Excellent ELOS: 10-14 days   Today's Date: 04/12/2019 PT Individual Time: 1352-1506 PT Individual Time Calculation (min): 74 min    Problem List:  Patient Active Problem List   Diagnosis Date Noted  . Physical debility 04/11/2019  . Acute on chronic respiratory failure with hypoxia ([place])   . Paroxysmal atrial fibrillation (HCC)   . Pneumonia due to Pseudomonas aeruginosa ([place])   . Chronic atrial fibrillation   . Acute pancreatitis with uninfected necrosis, unspecified   . Metabolic encephalopathy   . Severe sepsis ([place])   . Bruit 02/24/2015  . Chronic diastolic heart failure- Grade 2 05/03/2013  . PAF (paroxysmal atrial fibrillation) ([place]) 05/01/2013  . HTN (hypertension) 05/01/2013  . Gout 05/01/2013  . 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 Upper extremity weakness 04/06/2013  . GERD (gastroesophageal reflux disease) 01/17/2013  . Barrett's esophagus 01/25/2012  . Dysphagia 01/25/2012  . Mitral regurgitation 09/27/2011  . Paroxysmal atrial fibrillation (HCC)   . Left ventricular hypertrophy   . Essential hypertension, benign     Past Medical History:  Past Medical History:  Diagnosis Date  . Acute on chronic respiratory failure with hypoxia ([place])   . Acute pancreatitis with uninfected necrosis, unspecified   . Chronic atrial fibrillation   . Essential hypertension, benign   . GERD (gastroesophageal reflux disease)   . Gouty arthropathy, unspecified   . Hypothyroidism   . Melanoma ([place])   . Metabolic encephalopathy   . Other and unspecified hyperlipidemia   . Paroxysmal atrial fibrillation (HCC)   . Pneumonia due to Pseudomonas aeruginosa ([place])   . Severe sepsis [facility])    Past Surgical History:  Past Surgical History:  Procedure  Laterality Date  . ESOPHAGEAL DILATION  01/15/2016   Procedure: ESOPHAGEAL DILATION;  Surgeon: Rogene Houston, MD;  Location[facility];  Service: Endoscopy;;  . ESOPHAGOGASTRODUODENOSCOPY N/A 01/15/2016   Procedure: ESOPHAGOGASTRODUODENOSCOPY (EGD);  Surgeon: Rogene Houston, MD;  Location: [facility];  Service: Endoscopy;  Laterality: N/A;  1030  . FRACTURE SURGERY Left    wrist, multiple surgeries  . MELANOMA EXCISION Left 07/2018   groin  . NEUROLYSIS MEDIAN NERVE Right 11/27/2013   Neurolysis Brachial Plexus  . SHOULDER SURGERY Right     Assessment & Plan Clinical Impression: Patient is a 68 y.o. year old male with with history of T2DM, melanoma, GERD, gout, HTN, brachial plexus injury BUE, PAF- on eliquis who was admitted [facility] on 02/20/19 withsever abdominal pain, nauseas and abdominal distension with CT abdomen revealing.acute pancreatitis. He was hypoxic on admission and continued to worsen requiring intubation as well as levophed due to septic shock. Hospital coursecomplicated by acute respiratory failure with ARDS, severesepsis due to Pseudomonasbacteremiaas well aspseudomonas PNA, A fib with flutterand encephalopathy. He was started on meropenum and pancreatitis treated with conservative care. Follow up MRCP showed cholelithiasis without cholecystitis, thrombosed left portal vein and improvement in pancreatic edema. EEG done due to decrease in LOC showing nonspecific moderate encephalopathy. He required tracheostomy and PEG placement on 08/12due to inability to wean off vent.He was transferred to [facility] on 03/16/19 for further management.  He completed his course of meropenum for recurrent Pseudomonas PNA and was able to tolerate vent wean. Hospital course significant for issues with agitation, code blue 8/25 with weaning attempts, electrolyte abnormalities,  hematuria as well as A fib with RVR. BB added and weaned off IV Cardizem but continues to  have tachycardia with activity requiring prn IV lopressor. He tolerated capping of trach 9/3, was started on regular diet and decannulated by 9/6. He has had issues with hematuria (pulling on foley) 8/29 and renal ultrasound showed decompressed bladder with thick wall. Foley d/c with attempts at voiding trials but he has had retention requiring I/O caths and recurrent bleeding since 9/6 therefore foley replaced. Follow up renal ultrasound 9/8 showed decompressed bladder with enlarged prostate and normal echogenicity. Blood thinners have been held on and off due to hematuria----low dose Eliquis resumed on 09/08 and Rocephin added due to concerns of UTI. Therapy has been ongoing and patient continues to be limited by debility affecting ADLs and mobility. CIR recommended due to functional decline. Patient transferred to CIR on 04/11/2019 .   Patient currently requires mod with mobility secondary to muscle weakness, decreased cardiorespiratoy endurance, impaired timing and sequencing and unbalanced muscle activation and decreased standing balance, decreased postural control and decreased balance strategies.  Prior to hospitalization, patient was independent  with mobility and lived with Spouse in a House home.  Home access is  Level entry.  Patient will benefit from skilled PT intervention to maximize safe functional mobility, minimize fall risk and decrease caregiver burden for planned discharge home with 24 hour supervision.  Anticipate patient will benefit from follow up [place] at discharge.  PT - End of Session Activity Tolerance: Tolerates 30+ min activity with multiple rests Endurance Deficit: Yes Endurance Deficit Description: requires frequent seated rest breaks with increased RR with activity PT Assessment Rehab Potential (ACUTE/IP ONLY): Excellent PT Patient demonstrates impairments in the following area(s): Balance;Safety;Endurance;Motor;Pain;Behavior PT Transfers Functional Problem(s): Bed  Mobility;Bed to Chair;Car;Furniture;Floor PT Locomotion Functional Problem(s): Ambulation;Stairs PT Plan PT Intensity: Minimum of 1-2 x/day ,45 to 90 minutes PT Frequency: 5 out of 7 days PT Duration Estimated Length of Stay: 10-14 days PT Treatment/Interventions: Ambulation/gait training;Community reintegration;DME/adaptive equipment instruction;Neuromuscular re-education;Psychosocial support;Stair training;UE/LE Strength taining/ROM;Balance/vestibular training;Discharge planning;Functional electrical stimulation;Pain management;Skin care/wound management;Therapeutic Activities;UE/LE Coordination activities;Cognitive remediation/compensation;Disease management/prevention;Functional mobility training;Patient/family education;Splinting/orthotics;Therapeutic Exercise;Visual/perceptual remediation/compensation PT Transfers Anticipated Outcome(s): mod-I transfers with LRAD PT Locomotion Anticipated Outcome(s): supervision with LRAD PT Recommendation Recommendations for Other Services: Therapeutic Recreation consult Therapeutic Recreation Interventions: Outing/community reintergration Follow Up Recommendations: Home health PT;24 hour supervision/assistance Patient destination: Home Equipment Recommended: To be determined;Other (comment) Equipment Details: has RW and [facility]  Skilled Therapeutic Intervention Evaluation completed (see details above and below) with education on PT POC and goals and individual treatment initiated with focus on bed mobility, transfers, gait, stair navigation, activity tolerance as well as education regarding daily therapy schedule, weekly team meetings, purpose of PT evaluation, and other CIR information. Pt received ambulating out of bathroom using RW without any staff present. Therapist provided min assist for balance ambulating with RW to recliner and performing hand hygiene standing at sink with max cuing for proper AD management at counter. Therapist educated pt on  importance of calling for staff assistance prior to initiating standing off of the toilet. Performed sit<>stands with mod assist for lifting into standing and min assist for eccentric control on lowering. Ambulated ~24f recliner>EOB using RW with min assist for balance - demonstrates decreased B LE step length, decreased B LE foot clearance and decreased gait speed. Supine<>sit, HOB flat and not using bedrails, with supervision. Therapist provided pt with w/c and w/c cushion to use while in CIR for increased OOB and upright activity tolerance.  Transported to/from gym in w/c for energy conservation. Ambulated ~40f, no AD, with mod assist for balance and pt demonstrating decreased safety with slight L knee buckling during stance without UE support transitioned to ambulated 14104fusing RW with min assist for balance - continues to demonstrate above gait impairments of decreased B LE step length, foot clearance, and gait speed - SpO2 96% and HR 80bpm. Ascended/descended 8 steps with step-to pattern using only R HR on ascent and B HRs on descent as pt unable to perform eccentric control without B UE support - mod assist for lifting/lowering and balance - max cuing and visual demonstration of proper sequencing of "up with the good, down with the bad." Transported to car room in w/c. Ambulatory simulated car transfer (SUV height) using RW with visual demonstration of proper sequencing - pt demonstrated understanding with min assist for balance. Ambulated ~1548f2 up/down ramp using RW with min assist for balance. Transported back to room in w/c. Ambulated ~8ft53fc>recliner using RW with min assist for balance. Pt left sitting in recliner with needs in reach and chair alarm on.  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: reports has [facility] RW; Cablejoys working around his house doing yardPresenter, broadcastingthin FuncKalkaskatact  Cognition Overall Cognitive Status: Within FuncAdvertising copywriter tasks assessed Arousal/Alertness: Awake/alert Orientation Level: Oriented X4 Attention: Focused;Sustained Focused Attention: Appears intact Sustained Attention: Appears intact Memory: Appears intact Safety/Judgment: Impaired(pt was ambulating out of the bathroom upon therapist arrival) Sensation Sensation Light Touch: Appears Intact Hot/Cold: Not tested Proprioception: Appears Intact Stereognosis: Not tested Coordination Gross Motor Movements are Fluid and Coordinated: No Coordination and Movement Description: impaired due to generalized weakness and deconditioning Motor  Motor Motor: Other (comment) Motor - Skilled Clinical Observations: Generailized weakness (L LE > R LE) and deconditioning  Mobility Bed Mobility Bed Mobility: Supine to Sit;Sit to Supine Supine to Sit: Supervision/Verbal cueing Sit to Supine: Supervision/Verbal cueing Transfers Transfers: Sit to Stand;Stand to Sit;Stand Pivot Transfers Sit to Stand: Moderate Assistance - Patient 50-74%;Minimal Assistance - Patient > 75% Stand to Sit: Minimal Assistance - Patient > 75% Stand Pivot Transfers: Minimal Assistance - Patient > 75%;Moderate Assistance - Patient 50 - 74% Stand Pivot Transfer Details: Tactile cues for sequencing;Verbal cues for safe use of  DME/AE;Verbal cues for technique;Verbal cues for sequencing;Verbal cues for gait pattern;Verbal cues for precautions/safety;Tactile cues for weight shifting Transfer (Assistive device): Rolling walker Locomotion  Gait Ambulation: Yes Gait Assistance: Minimal Assistance - Patient > 75%;Moderate Assistance - Patient 50-74% Gait Distance (Feet): 140 Feet Assistive device: Rolling walker Gait Assistance Details: Verbal cues for safe use of DME/AE;Verbal cues for technique;Verbal cues for sequencing;Verbal cues for gait pattern;Tactile cues for weight shifting;Tactile cues for posture Gait Gait: Yes Gait Pattern: Impaired Gait Pattern: Decreased step length - left;Decreased step length - right;Decreased stride length;Poor foot clearance - left;Poor foot clearance - right Gait velocity: decreased Stairs / Additional Locomotion Stairs: Yes Stairs Assistance: Moderate Assistance - Patient 50 - 74% Stair Management Technique: Two rails Number  of Stairs: 8 Height of Stairs: 6 Ramp: Minimal Assistance - Patient >75% Wheelchair Mobility Wheelchair Mobility: No  Trunk/Postural Assessment  Cervical Assessment Cervical Assessment: Exceptions to WFL(forward head) Thoracic Assessment Thoracic Assessment: Exceptions to WFL(thoracic rounding) Lumbar Assessment Lumbar Assessment: Exceptions to [facility] Postural Control Postural Control: Deficits on evaluation Postural Limitations: decreased with pt using B UE support on RW for standing balance  Balance Balance Balance Assessed: Yes Static Sitting Balance Static Sitting - Balance Support: Feet supported Static Sitting - Level of Assistance: 5: Stand by assistance Dynamic Sitting Balance Dynamic Sitting - Balance Support: During functional activity Dynamic Sitting - Level of Assistance: 5: Stand by assistance Static Standing Balance Static Standing - Balance Support: During functional activity Static Standing - Level of Assistance: 4: Min  assist Dynamic Standing Balance Dynamic Standing - Balance Support: During functional activity Dynamic Standing - Level of Assistance: 3: Mod assist Extremity Assessment      RLE Assessment RLE Assessment: Exceptions to [facility] RLE Strength Right Hip Flexion: 4/5 Right Knee Flexion: 4-/5 Right Knee Extension: 4-/5 Right Ankle Dorsiflexion: 3+/5 Right Ankle Plantar Flexion: 3+/5 LLE Assessment LLE Assessment: Exceptions to [facility] LLE Strength Left Hip Flexion: 3+/5 Left Knee Flexion: 3+/5 Left Knee Extension: 4-/5 Left Ankle Dorsiflexion: 3/5 Left Ankle Plantar Flexion: 3+/5    Refer to Care Plan for Long Term Goals  Recommendations for other services: Therapeutic Recreation  Outing/community reintegration  Discharge Criteria: Patient will be discharged from PT if patient refuses treatment 3 consecutive times without medical reason, if treatment goals not met, if there is a change in medical status, if patient makes no progress towards goals or if patient is discharged from hospital.  The above assessment, treatment plan, treatment alternatives and goals were discussed and mutually agreed upon: by patient  Tawana Scale, PT, DPT 04/12/2019, 7:59 AM

## 2019-04-12 NOTE — Progress Notes (Signed)
Social Work Assessment and Plan   Patient Details  Name: Andrew Compton MRN: [id number] Date of Birth: [birth year]  Today's Date: 04/12/2019  Problem List:  Patient Active Problem List   Diagnosis Date Noted  . Physical debility 04/11/2019  . Acute on chronic respiratory failure with hypoxia ([place])   . Paroxysmal atrial fibrillation (HCC)   . Pneumonia due to Pseudomonas aeruginosa ([place])   . Chronic atrial fibrillation   . Acute pancreatitis with uninfected necrosis, unspecified   . Metabolic encephalopathy   . Severe sepsis ([place])   . Bruit 02/24/2015  . Chronic diastolic heart failure- Grade 2 05/03/2013  . PAF (paroxysmal atrial fibrillation) ([place]) 05/01/2013  . HTN (hypertension) 05/01/2013  . Gout 05/01/2013  . 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 Upper extremity weakness 04/06/2013  . GERD (gastroesophageal reflux disease) 01/17/2013  . Barrett's esophagus 01/25/2012  . Dysphagia 01/25/2012  . Mitral regurgitation 09/27/2011  . Paroxysmal atrial fibrillation (HCC)   . Left ventricular hypertrophy   . Essential hypertension, benign    Past Medical History:  Past Medical History:  Diagnosis Date  . Acute on chronic respiratory failure with hypoxia ([place])   . Acute pancreatitis with uninfected necrosis, unspecified   . Chronic atrial fibrillation   . Essential hypertension, benign   . GERD (gastroesophageal reflux disease)   . Gouty arthropathy, unspecified   . Hypothyroidism   . Melanoma ([place])   . Metabolic encephalopathy   . Other and unspecified hyperlipidemia   . Paroxysmal atrial fibrillation (HCC)   . Pneumonia due to Pseudomonas aeruginosa ([place])   . Severe sepsis [facility])    Past Surgical History:  Past Surgical History:  Procedure Laterality Date  . ESOPHAGEAL DILATION  01/15/2016   Procedure: ESOPHAGEAL DILATION;  Surgeon: Rogene Houston, MD;  [facility];  Service: Endoscopy;;  . ESOPHAGOGASTRODUODENOSCOPY N/A 01/15/2016   Procedure:  ESOPHAGOGASTRODUODENOSCOPY (EGD);  Surgeon: Rogene Houston, MD;  [facility];  Service: Endoscopy;  Laterality: N/A;  1030  . FRACTURE SURGERY Left    wrist, multiple surgeries  . MELANOMA EXCISION Left 07/2018   groin  . NEUROLYSIS MEDIAN NERVE Right 11/27/2013   Neurolysis Brachial Plexus  . SHOULDER SURGERY Right    Social History:  reports that he quit smoking about 22 years ago. He has never used smokeless tobacco. He reports current alcohol use. He reports that he does not use drugs.  Family / Support Systems Marital Status: Married Patient Roles: Spouse, Parent Spouse/Significant Other: Helene Kelp [phone] Children: Two step children who live locally in [place] Other Supports: Friends Anticipated Caregiver: wife Ability/Limitations of Caregiver: no limitations retired and in good health Caregiver Availability: 24/7 Family Dynamics: Close knit family who are there for one another. Pt was enjoying retirement since he had just retired and was on vacation at [facility] when this happened. He feels they have good supports.  Social History Preferred language: English Religion: Christian Cultural Background: No issues Education: Scientist, clinical (histocompatibility and immunogenetics) Read: Yes Write: Yes Employment Status: Retired Public relations account executive Issues: No issues Guardian/Conservator: none-according to MD pt is capable of making his own decisions while here. Pt does want wife involved also   Abuse/Neglect Abuse/Neglect Assessment Can Be Completed: Yes Physical Abuse: Denies Verbal Abuse: Denies Sexual Abuse: Denies Exploitation of patient/patient's resources: Denies Self-Neglect: Denies  Emotional Status Pt's affect, behavior and adjustment status: Pt is motivated to continue to improve he has done well thus far and is ready to go the rest of the way  and be back home. He has no memory of all that has happened to him. He wants to get back to his independent level and  be back home. Recent Psychosocial Issues: other health issues were managed by his PCP Psychiatric History: No history-deferred depression screen due to doing well and adjusting to the new unit. He may benefit from seeing neuro-psych while here due to long hospitalization and numerous complications since being admitted. Substance Abuse History: No issues  Patient / Family Perceptions, Expectations & Goals Pt/Family understanding of illness & functional limitations: Pt and wife can explain his health issues and complications. They do talk with the MD's daily and feel they have a good understanding of his treatment plan going forward. Glad he is on rehab the last step before going home. Premorbid pt/family roles/activities: Husband, father, retiree, friend, church member, etc Anticipated changes in roles/activities/participation: resume Pt/family expectations/goals: Pt states: " I want to be able to take care of myself, like I was doing before this happen."   Wife states: " I hope he continues to improve but will assist him, just need to know what he will need."  [employer]: None Premorbid Home Care/DME Agencies: None Transportation available at discharge: Wife, pt drove prior to admission Resource referrals recommended: Neuropsychology, Support group (specify)  Discharge Planning Living Arrangements: Spouse/significant other Support Systems: Spouse/significant other, Children, Water engineer, Social worker community Type of Residence: Private residence Insurance underwriter Resources: [employer], Multimedia programmer (specify)([employer]) Financial Resources: Radio broadcast assistant Screen Referred: No Living Expenses: Own Money Management: Spouse, Patient Does the patient have any problems obtaining your medications?: No Home Management: he and wife-he does outside work Patient/Family Preliminary Plans: Return home with wife who is able to assist, she is in good health  and retired also. Pt hopes he can be mod/i before going home from here. Will await therapy evaluations and work on discharge needs. Social Work Anticipated Follow Up Needs: HH/OP, Support Group  Clinical Impression Pleasant gentleman who is motivated to do well and recover from his health issues and get back home. He is glad to be on the last leg on his journey. His wife due to the distance comes twice a week to see him. She is here today and will assist him at discharge. Will await therapy evaluations and work on discharge needs. May benefit from seeing neuro-psych while here.  Elease Hashimoto 04/12/2019, 12:31 PM

## 2019-04-12 NOTE — Care Management Note (Signed)
[facility] Individual Statement of Services  Patient Name:  Andrew Compton  Date:  04/12/2019  Welcome to the [facility].  Our goal is to provide you with an individualized program based on your diagnosis and situation, designed to meet your specific needs.  With this comprehensive rehabilitation program, you will be expected to participate in at least 3 hours of rehabilitation therapies Monday-Friday, with modified therapy programming on the weekends.  Your rehabilitation program will include the following services:  Physical Therapy (PT), Occupational Therapy (OT), Speech Therapy (ST), 24 hour per day rehabilitation nursing, Therapeutic Recreaction (TR), Neuropsychology, Case Management (Social Worker), Rehabilitation Medicine, Nutrition Services and Pharmacy Services  Weekly team conferences will be held on Wednesday to discuss your progress.  Your Social Worker will talk with you frequently to get your input and to update you on team discussions.  Team conferences with you and your family in attendance may also be held.  Expected length of stay: 10-14 days  Overall anticipated outcome: supervision-independent with device  Depending on your progress and recovery, your program may change. Your Social Worker will coordinate services and will keep you informed of any changes. Your Social Worker's name and contact numbers are listed  below.  The following services may also be recommended but are not provided by the [place] will be made to provide these services after discharge if needed.  Arrangements include referral to agencies that provide these services.  Your insurance has been verified to be:  [employer] Your primary doctor is:  Consuello Masse  Pertinent information will be shared with your  doctor and your insurance company.  Social Worker:  Ovidio Kin, [place] or ([phone]  Information discussed with and copy given to patient by: Elease Hashimoto, 04/12/2019, 9:57 AM

## 2019-04-12 NOTE — Progress Notes (Signed)
[place] PHYSICAL MEDICINE & REHABILITATION PROGRESS NOTE   Subjective/Complaints: Patient had a good first night.  He is undergoing speech therapy evaluation and he had a perfect score on his [place] CA  Doing well with bowel and bladder. Review of systems negative for chest pain shortness of breath nausea vomiting diarrhea   Objective:   US Renal  Result Date: 04/10/2019 CLINICAL DATA:  68 year old male with hematuria. EXAM: RENAL / URINARY TRACT ULTRASOUND COMPLETE COMPARISON:  Renal ultrasound 03/31/2019. FINDINGS: Right Kidney: Renal measurements: 10.5 x 5.6 x 6.4 centimeters = volume: 183 mL . Echogenicity within normal limits. No mass or hydronephrosis visualized. Left Kidney: Renal measurements: 10.9 x 5.9 x 7.6 centimeters = volume: 255 mL. Better visualized than on the August exam although still mildly obscured by bowel gas. Cortical thickness and echogenicity appear normal. No left hydronephrosis or renal mass is evident. Bladder: Foley catheter balloon in place (image 22) with decompressed urinary bladder and generalized bladder wall thickening. Other findings: Prostate with a diameter of 54 millimeters and estimated volume of 73 milliliters. IMPRESSION: 1. Bladder remains decompressed by a Foley catheter and appears thick walled as before. 2. Negative ultrasound appearance of both kidneys. 3. Prostate enlargement with estimated volume of 73 mL. Electronically Signed   By: Genevie Ann M.D.   On: 04/10/2019 09:57   Recent Labs    04/11/19 2230  WBC 5.5  HGB 9.6*  HCT 31.0*  PLT 193   Recent Labs    04/11/19 2230  NA 139  K 3.8  CL 102  CO2 27  GLUCOSE 131*  BUN 15  CREATININE 0.92  CALCIUM 9.1    Intake/Output Summary (Last 24 hours) at 04/12/2019 [id number] Last data filed at 04/12/2019 0720 Gross per 24 hour  Intake 796 ml  Output 750 ml  Net 46 ml     Physical Exam: Vital Signs Blood pressure 131/83, pulse 69, temperature 98.1 F (36.7 C), temperature source Oral, resp.  rate 17, SpO2 91 %.     Assessment/Plan: 1. Functional deficits secondary to debility from complex medical illness which require 3+ hours per day of interdisciplinary therapy in a comprehensive inpatient rehab setting.  Physiatrist is providing close team supervision and 24 hour management of active medical problems listed below.  Physiatrist and rehab team continue to assess barriers to discharge/monitor patient progress toward functional and medical goals  Care Tool:  Bathing              Bathing assist       Upper Body Dressing/Undressing Upper body dressing        Upper body assist      Lower Body Dressing/Undressing Lower body dressing            Lower body assist       Toileting Toileting    Toileting assist       Transfers Chair/bed transfer  Transfers assist     Chair/bed transfer assist level: Minimal Assistance - Patient > 75%     Locomotion Ambulation   Ambulation assist              Walk 10 feet activity   Assist           Walk 50 feet activity   Assist           Walk 150 feet activity   Assist           Walk 10 feet on uneven surface  activity   Assist  Wheelchair     Assist               Wheelchair 50 feet with 2 turns activity    Assist            Wheelchair 150 feet activity     Assist          Blood pressure 131/83, pulse 69, temperature 98.1 F (36.7 C), temperature source Oral, resp. rate 17, SpO2 91 %.  Medical Problem List and Plan: 1.   Debility secondary to Pseudomonas bacteremia and sepsis Initiate PT OT in a.m. 2. Antithrombotics: -DVT/anticoagulation:Pharmaceutical:Other (comment)--Eliquis -antiplatelet therapy: N/A 3. Pain Management:N/A 4. Mood:LCSW to follow for evaluation and support. -antipsychotic agents: Seroquel. 5. Neuropsych: This patientiscapable of making decisions on hisown behalf. 6.  Skin/Wound Care:Routine pressure relief measures. Add protein supplement to promote healing. 7. Fluids/Electrolytes/Nutrition:Monitor I/O. Check lytes today--increase K dur if K continues to be low. 8. A fib with RVR: Will check EKG as continues to go in and out of flutter. Continue amiodarone, metoprolol and cardizem. Will consult cardiology for input as needed. Vitals:   04/12/19 0552 04/12/19 1413  BP: 131/83 135/84  Pulse: 69 61  Resp: 17 18  Temp: 98.1 F (36.7 C) 97.6 F (36.4 C)  SpO2: 91% 91%  Blood pressure controlled, heart rate controlled thus far we will need to monitor and therapy 9. 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 Difficult cath due to BPH--needs coude cath. Continue flomax and will repeat trial in a few days. UCS negative--d/c rocephin after day # 3?  10. T2DM: Monitor BS ac/hs. Use SSI for elevated BS and resume metformin if BS trend upwards.  CBG (last 3)  Recent Labs    04/11/19 2123 04/12/19 0633 04/12/19 1143  GLUCAP 120* 110* 110*  Controlled, 9/10 11. Encephalopathy: Has resolved on Seroquel. Wean  12. Hypokalemia: K+ 3.1 on 9/7-->recheck labs today.  13. Anemia of chronic illness:Likely affected byintermittent hematuria. Will follow H/H with serial checks.    LOS: 1 days A FACE TO FACE EVALUATION WAS PERFORMED  Charlett Blake 04/12/2019, 9:27 AM

## 2019-04-12 NOTE — Evaluation (Signed)
Occupational Therapy Assessment and Plan  Patient Details  Name: Andrew Compton MRN: [id number] Date of Birth: 14-Mar-1951  OT Diagnosis: muscular wasting and disuse atrophy Rehab Potential: Rehab Potential (ACUTE ONLY): Excellent ELOS: 10-12 days   Today's Date: 04/12/2019 OT Individual Time: 9417-4081 OT Individual Time Calculation (min): 65 min     Problem List:  Patient Active Problem List   Diagnosis Date Noted  . Physical debility 04/11/2019  . Acute on chronic respiratory failure with hypoxia ([place])   . Paroxysmal atrial fibrillation ([facility])   . Pneumonia due to Pseudomonas aeruginosa ([place])   . Chronic atrial fibrillation   . Acute pancreatitis with uninfected necrosis, unspecified   . Metabolic encephalopathy   . Severe sepsis ([place])   . Bruit 02/24/2015  . Chronic diastolic heart failure- Grade 2 05/03/2013  . PAF (paroxysmal atrial fibrillation) ([place]) 05/01/2013  . HTN (hypertension) 05/01/2013  . Gout 05/01/2013  . 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 Upper extremity weakness 04/06/2013  . GERD (gastroesophageal reflux disease) 01/17/2013  . Barrett's esophagus 01/25/2012  . Dysphagia 01/25/2012  . Mitral regurgitation 09/27/2011  . Paroxysmal atrial fibrillation (H[facility])   . Left ventricular hypertrophy   . Essential hypertension, benign     Past Medical History:  Past Medical History:  Diagnosis Date  . Acute on chronic respiratory failure with hypoxia ([place])   . Acute pancreatitis with uninfected necrosis, unspecified   . Chronic atrial fibrillation   . Essential hypertension, benign   . GERD (gastroesophageal reflux disease)   . Gouty arthropathy, unspecified   . Hypothyroidism   . Melanoma ([place])   . Metabolic encephalopathy   . Other and unspecified hyperlipidemia   . Paroxysmal atrial fibrillation (H[facility])   . Pneumonia due to Pseudomonas aeruginosa ([place])   . Severe sepsis [facility])    Past Surgical History:  Past Surgical History:  Procedure Laterality  Date  . ESOPHAGEAL DILATION  01/15/2016   Procedure: ESOPHAGEAL DILATION;  Surgeon: Rogene Houston, MD;  Location: [facility];  Service: Endoscopy;;  . ESOPHAGOGASTRODUODENOSCOPY N/A 01/15/2016   Procedure: ESOPHAGOGASTRODUODENOSCOPY (EGD);  Surgeon: Rogene Houston, MD;  Location: [facility] SUITE;  Service: Endoscopy;  Laterality: N/A;  1030  . FRACTURE SURGERY Left    wrist, multiple surgeries  . MELANOMA EXCISION Left 07/2018   groin  . NEUROLYSIS MEDIAN NERVE Right 11/27/2013   Neurolysis Brachial Plexus  . SHOULDER SURGERY Right     Assessment & Plan Clinical Impression: Patient is a 68 y.o. year old male with recent admission to the hospital on 02/20/19 withsever abdominal pain, nauseas and abdominal distension with CT abdomen revealing.acute pancreatitis. He was hypoxic on admission and continued to worsen requiring intubation as well as levophed due to septic shock. Hospital coursecomplicated by acute respiratory failure with ARDS, severesepsis due to Pseudomonasbacteremiaas well aspseudomonas PNA, A fib with flutterand encephalopathy. He was started on meropenum and pancreatitis treated with conservative care. Follow up MRCP showed cholelithiasis without cholecystitis, thrombosed left portal vein and improvement in pancreatic edema. EEG done due to decrease in LOC showing nonspecific moderate encephalopathy. He required tracheostomy and PEG placement on 08/12due to inability to wean off vent.He was transferred to [facility] on 03/16/19 for further management..  Patient transferred to CIR on 04/11/2019 .    Patient currently requires min with basic self-care skills secondary to muscle weakness and decreased standing balance and decreased balance strategies.  Prior to hospitalization, patient could complete ADLs with modified independent .  Patient will benefit from skilled  intervention to decrease level of assist with basic self-care skills and increase independence with basic self-care  skills prior to discharge home with care partner.  Anticipate patient will require intermittent supervision and follow up home health.  OT - End of Session Activity Tolerance: Improving Endurance Deficit: Yes OT Assessment Rehab Potential (ACUTE ONLY): Excellent OT Patient demonstrates impairments in the following area(s): Balance;Endurance OT Basic ADL's Functional Problem(s): Grooming;Bathing;Dressing;Toileting OT Advanced ADL's Functional Problem(s): Simple Meal Preparation OT Transfers Functional Problem(s): Tub/Shower;Toilet OT Additional Impairment(s): None OT Plan OT Intensity: Minimum of 1-2 x/day, 45 to 90 minutes OT Frequency: 5 out of 7 days OT Duration/Estimated Length of Stay: 10-12 days OT Treatment/Interventions: Balance/vestibular training;Discharge planning;Pain management;Self Care/advanced ADL retraining;Therapeutic Activities;UE/LE Coordination activities;Therapeutic Exercise;Patient/family education;Functional mobility training;Disease mangement/prevention;Community reintegration;DME/adaptive equipment instruction;Neuromuscular re-education;UE/LE Strength taining/ROM OT Self Feeding Anticipated Outcome(s): independent OT Basic Self-Care Anticipated Outcome(s): supervision to modified independent OT Toileting Anticipated Outcome(s): modified independent OT Bathroom Transfers Anticipated Outcome(s): modified independent to supervision   Skilled Therapeutic Intervention Pt began selfcare retraining sit to stand from the EOB as he was initially hooked up to his midline and receiving antibiotics.  He was able to complete supervision level UB bathing and dressing.  Min assist for LB bathing and dressing sit to stand.  He completed functional mobility from the EOB to the bedside recliner with mod bilateral hand held assist.  He was able to ambulate to the door of the room and back to the recliner with min assist level.  Finished session with pt in the recliner with call button  and phone in reach and safety alarm pad in place.  Discussed expectations of LOB of 10-12 days as well as supervision to some modified independent level goals.    OT Evaluation Precautions/Restrictions  Precautions Precautions: Fall Restrictions Weight Bearing Restrictions: No   Pain Pain Assessment Pain Scale: 0-10 Pain Score: 0-No pain Home Living/Prior Functioning Home Living Living Arrangements: Spouse/significant other Available Help at Discharge: Family, Available 24 hours/day Type of Home: House Home Access: Level entry Home Layout: Two level Alternate Level Stairs-Number of Steps: has basement downstairs but can walk around outside of house to get in/out of basement via level surface; ~14 steps up/down Alternate Level Stairs-Rails: Right Bathroom Shower/Tub: Multimedia programmer: Handicapped height Bathroom Accessibility: Yes  Lives With: Spouse IADL History Homemaking Responsibilities: Yes Meal Prep Responsibility: Secondary Laundry Responsibility: Secondary Cleaning Responsibility: Secondary Bill Paying/Finance Responsibility: Secondary Current License: Yes Mode of Transportation: Other (comment) Education: 2 year college certification Occupation: Retired Prior Function Level of Independence: Independent with homemaking with ambulation, Independent with gait, Independent with transfers  Able to Take Stairs?: Yes Driving: Yes 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 Comments: reports has [place] and RW; enjoys working around his house doing yardwork ADL ADL Eating: Independent Where Assessed-Eating: Edge of bed Grooming: Minimal assistance Where Assessed-Grooming: Standing at sink Upper Body Bathing: Setup Where Assessed-Upper Body Bathing: Edge of bed Lower Body Bathing: Minimal assistance Where Assessed-Lower Body Bathing: Edge of bed Upper Body Dressing: Supervision/safety Where Assessed-Upper Body Dressing: Chair Lower Body  Dressing: Minimal assistance Where Assessed-Lower Body Dressing: Wheelchair Toileting: Minimal assistance Where Assessed-Toileting: Glass blower/designer: Psychiatric nurse Method: Counselling psychologist: Raised toilet seat Vision Baseline Vision/History: Wears glasses Wears Glasses: At all times Patient Visual Report: No change from baseline Vision Assessment?: No apparent visual deficits Perception  Perception: Within Functional Limits Praxis Praxis: Intact Cognition Overall Cognitive Status: Within Functional Limits for tasks assessed Arousal/Alertness: Awake/alert Orientation Level:  Person;Place;Situation Person: Oriented Place: Oriented Situation: Oriented Year: 2020 Month: September Day of Week: Correct Memory: Appears intact Immediate Memory Recall: Sock;Blue;Bed Memory Recall Sock: Without Cue Memory Recall Blue: Without Cue Memory Recall Bed: With Cue Attention: Sustained;Selective Focused Attention: Appears intact Sustained Attention: Appears intact Safety/Judgment: Impaired Comments: Pt reported being up without assistance earlier in the day. Sensation Sensation Light Touch: Appears Intact Hot/Cold: Not tested Proprioception: Appears Intact Stereognosis: Appears Intact Coordination Gross Motor Movements are Fluid and Coordinated: No Fine Motor Movements are Fluid and Coordinated: No Coordination and Movement Description: Pt with history of 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 Motor  Motor Motor - Skilled Clinical Observations: Generailized weakness Mobility  Bed Mobility Bed Mobility: Supine to Sit Supine to Sit: Supervision/Verbal cueing Transfers Sit to Stand: Minimal Assistance - Patient > 75% Stand to Sit: Minimal Assistance - Patient > 75%  Trunk/Postural Assessment  Cervical Assessment Cervical Assessment: Exceptions to WFL(forward  head) Thoracic Assessment Thoracic Assessment: Exceptions to WFL(thoracic rounding) Lumbar Assessment Lumbar Assessment: Exceptions to WFL(Lumbar flexion in standing)  Balance Balance Balance Assessed: Yes Static Sitting Balance Static Sitting - Balance Support: Feet supported Static Sitting - Level of Assistance: 7: Independent Dynamic Sitting Balance Dynamic Sitting - Balance Support: During functional activity Dynamic Sitting - Level of Assistance: 5: Stand by assistance Static Standing Balance Static Standing - Balance Support: During functional activity Static Standing - Level of Assistance: 4: Min assist Dynamic Standing Balance Dynamic Standing - Balance Support: During functional activity Dynamic Standing - Level of Assistance: 3: Mod assist Extremity/Trunk Assessment RUE Assessment RUE Assessment: Exceptions to [facility] Active Range of Motion (AROM) Comments: Shoulder flexion AROM 0-110 degrees bilaterally secondary to history of bilateral brachial plexus injury.  All other joints AROM WFLS General Strength Comments: shoulder strength 3+/5 all other joints 4/5 throughout LUE Assessment LUE Assessment: Exceptions to [facility] Active Range of Motion (AROM) Comments: Shoulder flexion AROM 0-110 degrees bilaterally secondary to history of bilateral brachial plexus injury.  Limited digit MP extension 0-60 degrees General Strength Comments: Shoulder strength 3+/5 with elbow flexion and extension at 4/5 throughout     Refer to Care Plan for Long Term Goals  Recommendations for other services: None    Discharge Criteria: Patient will be discharged from OT if patient refuses treatment 3 consecutive times without medical reason, if treatment goals not met, if there is a change in medical status, if patient makes no progress towards goals or if patient is discharged from hospital.  The above assessment, treatment plan, treatment alternatives and goals were discussed and mutually agreed  upon: by patient  Eller Sweis OTR/L 04/12/2019, 4:33 PM

## 2019-04-13 ENCOUNTER — Inpatient Hospital Stay (HOSPITAL_COMMUNITY): Payer: Medicare Other | Admitting: Occupational Therapy

## 2019-04-13 ENCOUNTER — Inpatient Hospital Stay (HOSPITAL_COMMUNITY): Payer: Medicare Other | Admitting: Physical Therapy

## 2019-04-13 ENCOUNTER — Encounter (HOSPITAL_COMMUNITY): Payer: Self-pay | Admitting: Physician Assistant

## 2019-04-13 DIAGNOSIS — I483 Typical atrial flutter: Secondary | ICD-10-CM

## 2019-04-13 LAB — GLUCOSE, CAPILLARY
Glucose-Capillary: 96 mg/dL (ref 70–99)
Glucose-Capillary: 78 mg/dL (ref 70–99)
Glucose-Capillary: 123 mg/dL — ABNORMAL HIGH (ref 70–99)
Glucose-Capillary: 104 mg/dL — ABNORMAL HIGH (ref 70–99)

## 2019-04-13 LAB — HEMOGLOBIN A1C
Hgb A1c MFr Bld: 5.4 % (ref 4.8–5.6)
Mean Plasma Glucose: 108 mg/dL

## 2019-04-13 MED ORDER — DICLOFENAC SODIUM 1 % TD GEL
2.0000 g | Freq: Four times a day (QID) | TRANSDERMAL | Status: DC
  Administered 2019-04-13 – 2019-04-25 (×45): 2 g via TOPICAL
  Filled 2019-04-13: qty 100

## 2019-04-13 NOTE — IPOC Note (Signed)
Overall Plan of Care (IPOC) Patient Details Name: Andrew Compton MRN: [id number] DOB: [birth year]  Admitting Diagnosis: <principal problem not specified>  Hospital Problems: Active Problems:   Physical debility     Functional Problem List: Nursing Bladder, Edema, Medication Management  PT Balance, Safety, Endurance, Motor, Pain, Behavior  OT Balance, Endurance  SLP    TR         Basic ADL's: OT Grooming, Bathing, Dressing, Toileting     Advanced  ADL's: OT Simple Meal Preparation     Transfers: PT Bed Mobility, Bed to Chair, Car, Furniture, Floor  OT Tub/Shower, Agricultural engineer: PT Ambulation, Stairs     Additional Impairments: OT None  SLP None      TR      Anticipated Outcomes Item Anticipated Outcome  Self Feeding independent  Swallowing      Basic self-care  supervision to modified independent  Toileting  modified independent   Bathroom Transfers modified independent to supervision  Bowel/Bladder  to remain contiennt LBM 04/11/19, continent with bladder after foley removed  Transfers  mod-I transfers with LRAD  Locomotion  supervision with LRAD  Communication     Cognition     Pain  less than 3  Safety/Judgment  to remain fall free while in rehab   Therapy Plan: PT Intensity: Minimum of 1-2 x/day ,45 to 90 minutes PT Frequency: 5 out of 7 days PT Duration Estimated Length of Stay: 10-14 days OT Intensity: Minimum of 1-2 x/day, 45 to 90 minutes OT Frequency: 5 out of 7 days OT Duration/Estimated Length of Stay: 10-12 days     Due to the current state of emergency, patients may not be receiving their 3-hours of Medicare-mandated therapy.   Team Interventions: Nursing Interventions Patient/Family Education, Discharge Planning, Bladder Management  PT interventions Ambulation/gait training, Community reintegration, DME/adaptive equipment instruction, Neuromuscular re-education, Psychosocial support, Stair training, UE/LE Strength  taining/ROM, Training and development officer, Discharge planning, Functional electrical stimulation, Pain management, Skin care/wound management, Therapeutic Activities, UE/LE Coordination activities, Cognitive remediation/compensation, Disease management/prevention, Functional mobility training, Patient/family education, Splinting/orthotics, Therapeutic Exercise, Visual/perceptual remediation/compensation  OT Interventions Balance/vestibular training, Discharge planning, Pain management, Self Care/advanced ADL retraining, Therapeutic Activities, UE/LE Coordination activities, Therapeutic Exercise, Patient/family education, Functional mobility training, Disease mangement/prevention, Community reintegration, Engineer, drilling, Neuromuscular re-education, UE/LE Strength taining/ROM  SLP Interventions    TR Interventions    SW/CM Interventions Psychosocial Support, Discharge Planning, Patient/Family Education   Barriers to Discharge MD  Medical stability and IV antibiotics  Nursing      PT      OT      SLP      SW       Team Discharge Planning: Destination: PT-Home ,OT-   , SLP-Home Projected Follow-up: PT-Home health PT, 24 hour supervision/assistance, OT-   , SLP-None Projected Equipment Needs: PT-To be determined, Other (comment), OT-  , SLP-None recommended by SLP Equipment Details: PT-has RW and SPC, OT-  Patient/family involved in discharge planning: PT- Patient,  OT- , SLP-Patient  MD ELOS: 10-14d Medical Rehab Prognosis:  Good Assessment:  68 year old male with medical history including hypertension, type 2 Diabetes, atrial fibrillation on Eliquis and GERD. Presented to [facility] on February 20, 2019 with complaints of abdominal pain, nausea and abdominal distention. CT abdomen and pelvis demonstrated acute pancreatitis. Patient on vacation and reportedly drank a few beers, but does not drink in excess regularly. Elevated bilirubin and was hypoxic on  arrival to the ED.  Began empiric meropenem. Pt became hypotensive with increase work of breathing requiring Levophed and BIPAP. He continued to decompensate and required intubation on July 21 st. Initial blood cultures were gram negative rods later determined to be Klebsiella pneumonia. There was concern for the possibility of acute cholangitis due to gallstones noted on CT abdomen and pelvis however MRCP on 7/25 demonstrated no evidence . 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 Respiratory distress syndrome likely secondary to sepsis and septic shock on admission. On lovenox for the portal vein thrombosis. Broad spectrum antibiotics lastly meropenem for sputum cultures demonstrating pseudomonas. Pt with encephalopathy and underwent PEG and trach placement on 8/12. Pt on amiodarone and diltiazem for his atrial fibrillation.   Patient admitted to [place] on 03/16/2019. Pt remained on Meropenem until 8/16. Heart rate difficult to control initially and Cardizem increased. Malnutrition managed with PEG feeds. Continued on Lovenox and then switched to Eliquis, which was withheld twice due to hematuria. Hematuria felt to be traumatic in origin. COude catheter. Renal ultrasounds twice were negative. Patient weaned from ventilator and de cannulated trach. Now on room air. MBS completed 9/4 and pt began on regular diet with thin liquids. Encephalopathy resolved.    See Team Conference Notes for weekly updates to the plan of care

## 2019-04-13 NOTE — Progress Notes (Signed)
Physical Therapy Session Note  Patient Details  Name: Andrew Compton MRN: [id number] Date of Birth: [birth year]  Today's Date: 04/13/2019 PT Individual Time: 1530-1630   60 min   Short Term Goals: Week 1:  PT Short Term Goal 1 (Week 1): Pt will perform sit<>stand transfers with CGA PT Short Term Goal 2 (Week 1): Pt will perform bed<>chair transfers using LRAD with CGA PT Short Term Goal 3 (Week 1): Pt will ambulate at least 175f using LRAD with CGA PT Short Term Goal 4 (Week 1): Pt will ascend/descend 8 steps using handrails with CGA  Skilled Therapeutic Interventions/Progress Updates:   Pt received sitting in recliner and agreeable to PT. [facility] and [facility] for [facility]. Gait training with RW x [id number] CGA-supervision assist for safety, mild knee instability intermittiently, due to pain, but no buckling noted. Standing tolerance/balance with no UE support to complete peg bard puzzle x 2 for 5 minutes and Wii bowling x 10 minutes with CGA-min assist to prevent 2 near LOB posteriorly. Min cues for use of ankle strategy when LOB noted. Pt returned to room and performed stand pivot transfer to bed with min assist and RW. Sit>supine completed with supervision assist, and left supine in bed with call bell in reach and all needs met.             Therapy Documentation Precautions:  Precautions Precautions: Fall Restrictions Weight Bearing Restrictions: No Vital Signs: Therapy Vitals Temp: 98.2 F (36.8 C) Temp Source: Oral Pulse Rate: 63 Resp: 16 BP: 119/80 Patient Position (if appropriate): Sitting Oxygen Therapy SpO2: 100 % O2 Device: Room Air Pain: Pain Assessment Pain Scale: 0-10 Pain Score: 2  Pain Location: Knee Pain Orientation: Left Pain Intervention(s): Cold applied   Therapy/Group: Individual Therapy  AustLorie Phenix1/2020, 3:48 PM

## 2019-04-13 NOTE — Progress Notes (Signed)
Physical Therapy Session Note  Patient Details  Name: Andrew Compton MRN: [id number] Date of Birth: 05/04/1951  Today's Date: 04/13/2019 PT Individual Time: 0805-0904 PT Individual Time Calculation (min): 59 min   Short Term Goals: Week 1:  PT Short Term Goal 1 (Week 1): Pt will perform sit<>stand transfers with CGA PT Short Term Goal 2 (Week 1): Pt will perform bed<>chair transfers using LRAD with CGA PT Short Term Goal 3 (Week 1): Pt will ambulate at least 145ft using LRAD with CGA PT Short Term Goal 4 (Week 1): Pt will ascend/descend 8 steps using handrails with CGA  Skilled Therapeutic Interventions/Progress Updates:    Pt received supine in bed and agreeable to therapy session. Supine>sit, HOB partially elevated, with supervision. Sitting EOB with distant supervision donned B LE shoes with increased time. Sit<>stands using RW with min assist for lifting/lowering throughout session - pt demonstrates carryover of education regarding proper UE placement on AD. Standing at sink with min assist for balance performed oral care - demonstrated proper AD management at counter with carryover from education yesterday.  Transported to/from gym in w/c for energy conservation. Ambulated ~112ft using RW with min assist for balance - continues to demonstrate decreased gait speed with decreased B LE step length, decreased B LE foot clearance and increased B UE support on RW - cuing throughout for improved upright trunk posture and gait mechanics. Attempted repeated sit<>stands elevated EOM<>no UE support with pt reporting onset of the same quad tendon pain that occurred while walking (details below) - it is most prevalent during terminal knee extension while weightbearing.Transitioned to seated L LE long arc quads with 9lb weight x20 reps with focus on eccentric control loading of the tendon and increasing quadriceps muscle activation. Again performed repeated sit<>stands elevated EOM<>no UE support x10 reps  with min assist for lifting/lowering and pt reporting decreased L knee pain compared to prior. Supine<>sit on mat with supervision. Performed supine L LE quad sets x 5 reps for pt education on performing this exercise in addition to LAQs in his room outside of therapy sessions. Repeated sit<>stand elevated EOM<>no UE support x 8 reps with pt then reporting L knee onset pain again - Pam, PA present and notified of the knee pain (MD also notified at end of session). Stand pivot EOM>w/c using RW with CGA/min assist for balance. Transported back to room in w/c and pt left sitting in w/c with needs in reach, seat belt alarm on, and nursing staff and MD present to assume care of patient.  Therapy Documentation Precautions:  Precautions Precautions: Fall Restrictions Weight Bearing Restrictions: No  Pain:   Reports L superior knee pain near quad tendon that occurs during L stance phase of gait with terminal knee extension - rated as 7-8/10 and states that it doesn't hurt at all when sitting therefore provided seated rest breaks as needed throughout session. Pt also noted to have L knee swelling compared to R.   Therapy/Group: Individual Therapy  Tawana Scale, PT,DPT 04/13/2019, 7:48 AM

## 2019-04-13 NOTE — Progress Notes (Signed)
Occupational Therapy Session Note  Patient Details  Name: Andrew Compton MRN: [id number] Date of Birth: Nov 06, 1950  Today's Date: 04/13/2019 OT Individual Time: [id number] OT Individual Time Calculation (min): 74 min    Short Term Goals: Week 1:  OT Short Term Goal 1 (Week 1): Pt will complete toilet transfers using the RW and supervision. OT Short Term Goal 2 (Week 1): Pt will perform LB bathing and dressing sit to stand with supervision. OT Short Term Goal 3 (Week 1): Pt will complete toileting tasks sit to stand with supervision.  Skilled Therapeutic Interventions/Progress Updates:    Patient seated in w/c, ready for therapy session.  Short distance ambulation in room with RW CGA.  SPT with RW to/from w/c, shower bench, recliner with min a.  Patient completed bathing seated in shower S level, min cues for safety.  Grooming with set up, UB dressing set up/supervision, LB dressing min a for catheter management.  Foot wear set up/supervision.  Provided patient with long handled sponge.  Support for foley tubing with coban.  Ambulation with RW approx 80 feet CGA.  Completed standing balance and conditioning activities with CGA - noted left knee pain with weight shift.  Provided ice pack at close of session.  Patient seated in recliner at end of session with seat alarm set and call bell in reach.    Therapy Documentation Precautions:  Precautions Precautions: Fall Restrictions Weight Bearing Restrictions: No General:   Vital Signs:  Pain: Pain Assessment Pain Scale: 0-10 Pain Score: 2  Pain Location: Knee Pain Orientation: Left Pain Intervention(s): Cold applied Other Treatments:     Therapy/Group: Individual Therapy  Carlos Levering 04/13/2019, 12:18 PM

## 2019-04-13 NOTE — Progress Notes (Signed)
[facility] PHYSICAL MEDICINE & REHABILITATION PROGRESS NOTE   Subjective/Complaints: Some problems foley during therapy does not stay clipped in due to size  Of tube  Doing well with bowel and bladder. Review of systems negative for chest pain shortness of breath nausea vomiting diarrhea   Objective:   No results found. Recent Labs    04/11/19 2230  WBC 5.5  HGB 9.6*  HCT 31.0*  PLT 193   Recent Labs    04/11/19 2230  NA 139  K 3.8  CL 102  CO2 27  GLUCOSE 131*  BUN 15  CREATININE 0.92  CALCIUM 9.1    Intake/Output Summary (Last 24 hours) at 04/13/2019 0901 Last data filed at 04/13/2019 0710 Gross per 24 hour  Intake 1286 ml  Output 1700 ml  Net -414 ml     Physical Exam: Vital Signs Blood pressure 134/82, pulse (!) 57, temperature 98.6 F (37 C), temperature source Oral, resp. rate 17, SpO2 93 %.     Assessment/Plan: 1. Functional deficits secondary to debility from complex medical illness which require 3+ hours per day of interdisciplinary therapy in a comprehensive inpatient rehab setting.  Physiatrist is providing close team supervision and 24 hour management of active medical problems listed below.  Physiatrist and rehab team continue to assess barriers to discharge/monitor patient progress toward functional and medical goals  Care Tool:  Bathing    Body parts bathed by patient: Right arm, Left arm, Chest, Abdomen, Front perineal area, Buttocks, Right upper leg, Left upper leg, Right lower leg, Left lower leg, Face         Bathing assist Assist Level: Minimal Assistance - Patient > 75%     Upper Body Dressing/Undressing Upper body dressing   What is the patient wearing?: Pull over shirt    Upper body assist Assist Level: Supervision/Verbal cueing    Lower Body Dressing/Undressing Lower body dressing      What is the patient wearing?: Pants, Underwear/pull up     Lower body assist Assist for lower body dressing: Minimal Assistance  - Patient > 75%     Toileting Toileting    Toileting assist Assist for toileting: Minimal Assistance - Patient > 75%     Transfers Chair/bed transfer  Transfers assist     Chair/bed transfer assist level: Minimal Assistance - Patient > 75%     Locomotion Ambulation   Ambulation assist   Ambulation activity did not occur: Safety/medical concerns(required use of RW and was independent prior)          Walk 10 feet activity   Assist  Walk 10 feet activity did not occur: Safety/medical concerns        Walk 50 feet activity   Assist Walk 50 feet with 2 turns activity did not occur: Safety/medical concerns         Walk 150 feet activity   Assist Walk 150 feet activity did not occur: Safety/medical concerns         Walk 10 feet on uneven surface  activity   Assist Walk 10 feet on uneven surfaces activity did not occur: Safety/medical concerns(required use of RW and was independent prior)         Wheelchair     Assist Will patient use wheelchair at discharge?: No(TBD but anticipate pt will D/C at ambulatory level)             Wheelchair 50 feet with 2 turns activity    Assist  Wheelchair 150 feet activity     Assist          Blood pressure 134/82, pulse (!) 57, temperature 98.6 F (37 C), temperature source Oral, resp. rate 17, SpO2 93 %.  Medical Problem List and Plan: 1.   Debility secondary to Pseudomonas bacteremia and sepsis PT, OT CIR  2. Antithrombotics: -DVT/anticoagulation:Pharmaceutical:Other (comment)--Eliquis -antiplatelet therapy: N/A 3. Pain Management:N/A 4. Mood:LCSW to follow for evaluation and support. -antipsychotic agents: Seroquel. 5. Neuropsych: This patientiscapable of making decisions on hisown behalf. 6. Skin/Wound Care:Routine pressure relief measures. Add protein supplement to promote healing. 7. Fluids/Electrolytes/Nutrition:Monitor I/O.  K+ wnl cont 37meq Kdur daily 8. A fib with RVR: Will check EKG as continues to go in and out of flutter. Continue amiodarone, metoprolol and cardizem. Will consult cardiology for input as needed. Vitals:   04/13/19 0000 04/13/19 0427  BP: (!) 114/92 134/82  Pulse: 76 (!) 57  Resp:  17  Temp:  98.6 F (37 C)  SpO2: 99% 93%  Blood pressure controlled, heart rate controlled thus far we will need to monitor and therapy 9. 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 Difficult cath due to BPH--needs coude cath. Continue flomax and will repeat trial in a few days. UCS negative--d/c rocephin Voiding trial next week 10. T2DM: Monitor BS ac/hs. Use SSI for elevated BS and resume metformin if BS trend upwards.  CBG (last 3)  Recent Labs    04/12/19 1715 04/12/19 2147 04/13/19 0631  GLUCAP 99 105* 96  Controlled, 9/11 11. Encephalopathy: Has resolved on Seroquel. Wean  12. Hypokalemia: K+ 3.1 on 9/7-->recheck labs today.  13. Anemia of chronic illness:Likely affected byintermittent hematuria. Will follow H/H with serial checks.    LOS: 2 days A FACE TO FACE EVALUATION WAS PERFORMED  Charlett Blake 04/13/2019, 9:01 AM

## 2019-04-13 NOTE — Progress Notes (Addendum)
1933   Assess patient , alert, oriented x4, col;oration normal, respiration regular and unlabored, sitting up in chair at bedside,reported  HR 127 , B/P 150/112 by NT, upon assessing patient manual HR 98, B/P 132/84 manually , patient has hx of Afib/ flutter with AVR ,asymptomatic of chest pain of discomfort,no complaints. Send by cardiologist today with new order changes, monitor closely

## 2019-04-13 NOTE — Consult Note (Addendum)
Cardiology Consultation:   Patient ID: Andrew Compton; [id number]; 01/28/1951   Admit date: 04/11/2019 Date of Consult: 04/13/2019  Primary Care Provider: Manon Hilding, MD Primary Cardiologist: [name], MD 09/25/2018 Primary Electrophysiologist:  None   Patient Profile:   Andrew Compton is a 68 y.o. male with a hx of permanent Afib on Cardizem and Eliquis, nl LV on echo 2014, DM, HTN, AAA & iliac aneurysms, GERD, melanoma, who is being seen today for the evaluation of med mgt at the request of Dr Posey Pronto.  History of Present Illness:   Mr. Andrew Compton was hospitalized at [facility] 02/20/2019 with pancreatitis>> ARDS, respiratory failure requiring trach and PEG 2nd Klebsiella PNA, sepsis 2nd Pseudomonas PNA, had MRCP for the pancreatitis and found portal vein thrombosis, Lovenox>>Eliquis. He was transferred to [facility] 08/14. CCM has followed him since then.   He has been tachycardic at times, with HR 140s. His respiratory status improved very slowly, on full support at times, but was gradually weaned. Trach decannulated finally on 09/06. Had urinary retention, I/O caths caused hematuria>>Foley. Eliquis held at times due to hematuria, but resumed after Foley at low dose.   He was admitted to rehab on 09/09. He was going in/out of flutter per the H&P, but all ECGs are flutter and no strips available for review.   His HR was noted to be high at times, on amio, metoprolol and Cardizem. Cards consulted for management.  Mr Andrew Compton does not remember anything from when first in the ER to about 2-1/2 weeks ago, when he woke up in [facility].   His meds had not changed from his visit with Dr Stanford Breed, so he was on Cardizem CD 360 mg and Eliquis 5 mg bid for his Aflutter at his original admission in July.  He is never aware of the arrhythmia. Says he has been in it for years.  Never gets palpitations.  Says he does not have the stamina he once had, but can work around the yard, using tools  such as saws and mowing several acres. No new DOE, no orthopnea or PND. Has a small amount of LE edema now, new for him.   Never gets chest pain.  Never has presyncope or syncope.   No problems working with rehab, feels he is making progress. Is getting stronger, but it is slow.    Past Medical History:  Diagnosis Date  . Acute on chronic respiratory failure with hypoxia ([place])   . Acute pancreatitis with uninfected necrosis, unspecified   . Chronic atrial fibrillation   . Essential hypertension, benign   . GERD (gastroesophageal reflux disease)   . Gouty arthropathy, unspecified   . Hypothyroidism   . Melanoma ([place])   . Metabolic encephalopathy   . Other and unspecified hyperlipidemia   . Paroxysmal atrial fibrillation (HCC)   . Pneumonia due to Pseudomonas aeruginosa ([place])   . Severe sepsis [facility])     Past Surgical History:  Procedure Laterality Date  . ESOPHAGEAL DILATION  01/15/2016   Procedure: ESOPHAGEAL DILATION;  Surgeon: Rogene Houston, MD;  Location: [facility] ENDO SUITE;  Service: Endoscopy;;  . ESOPHAGOGASTRODUODENOSCOPY N/A 01/15/2016   Procedure: ESOPHAGOGASTRODUODENOSCOPY (EGD);  Surgeon: Rogene Houston, MD;  Location: [facility] ENDO SUITE;  Service: Endoscopy;  Laterality: N/A;  1030  . FRACTURE SURGERY Left    wrist, multiple surgeries  . MELANOMA EXCISION Left 07/2018   groin  . NEUROLYSIS MEDIAN NERVE Right 11/27/2013   Neurolysis Brachial Plexus  . SHOULDER SURGERY Right  Prior to Admission medications   Medication Sig Start Date End Date Taking? Authorizing Provider  acetaminophen (TYLENOL) 325 MG tablet Take 650 mg by mouth every 6 (six) hours as needed for mild pain or fever.   Yes [provider]  amantadine (SYMMETREL) 100 MG capsule Take 100 mg by mouth daily.   Yes [provider]  amiodarone (PACERONE) 200 MG tablet Take 200 mg by mouth daily.   Yes [provider]  antiseptic oral rinse (BIOTENE) LIQD 15 mLs by Mouth Rinse  route 3 (three) times daily.   Yes [provider]  diltiazem (CARDIZEM) 30 MG tablet Take 30 mg by mouth every 6 (six) hours.   Yes [provider]  famotidine (PEPCID) 20 MG tablet Take 20 mg by mouth 2 (two) times daily.   Yes [provider]  HYDROcodone-acetaminophen (NORCO/VICODIN) 5-325 MG tablet Take 1 tablet by mouth every 6 (six) hours as needed for moderate pain.   Yes [provider]  insulin lispro (HUMALOG) 100 UNIT/ML injection Inject into the skin See admin instructions. Per Sliding Scale every 6 hours   Yes [provider]  levothyroxine (SYNTHROID, LEVOTHROID) 50 MCG tablet Take 50 mcg by mouth daily.   Yes [provider]  Melatonin 3 MG TABS Take 3 mg by mouth at bedtime.   Yes [provider]  metoprolol tartrate (LOPRESSOR) 25 MG tablet Take 25 mg by mouth 2 (two) times daily.   Yes [provider]  modafinil (PROVIGIL) 100 MG tablet Take 100 mg by mouth daily.   Yes [provider]  Multiple Vitamins-Minerals (MULTIVITAMINS THER. W/MINERALS) TABS tablet Take 1 tablet by mouth daily.   Yes [provider]  Omega-3 1000 MG CAPS Take 1 capsule by mouth daily.   Yes [provider]  potassium chloride SA (K-DUR) 20 MEQ tablet Take 20 mEq by mouth daily.   Yes [provider]  QUEtiapine (SEROQUEL) 25 MG tablet Take 25 mg by mouth 3 (three) times daily.   Yes [provider]  sertraline (ZOLOFT) 50 MG tablet Take 50 mg by mouth daily.   Yes [provider]  tamsulosin (FLOMAX) 0.4 MG CAPS capsule Take 0.4 mg by mouth at bedtime.    Yes [provider]  apixaban (ELIQUIS) 5 MG TABS tablet Take 1 tablet (5 mg total) by mouth 2 (two) times daily. Patient not taking: Reported on 04/12/2019 01/10/19   Andrew Perla, MD  diltiazem [facility]) 360 MG 24 hr capsule Take 1 capsule (360 mg total) by mouth daily. Patient not taking: Reported on 04/12/2019  09/25/18   Andrew Perla, MD    Inpatient Medications: Scheduled Meds: . amantadine  100 mg Oral Daily  . amiodarone  200 mg Oral Daily  . apixaban  2.5 mg Oral BID  . Chlorhexidine Gluconate Cloth  6 each Topical Daily  . diclofenac sodium  2 g Topical QID  . diltiazem  60 mg Oral Q6H  . insulin aspart  0-5 Units Subcutaneous QHS  . insulin aspart  0-9 Units Subcutaneous TID WC  . levothyroxine  50 mcg Oral Q0600  . Melatonin  3 mg Oral QHS  . metoprolol tartrate  25 mg Oral BID  . multivitamin with minerals  1 tablet Oral Daily  . potassium chloride  20 mEq Oral Daily  . QUEtiapine  25 mg Oral BID  . sertraline  50 mg Oral Daily  . sodium chloride flush  10-40 mL Intracatheter Q12H  .  tamsulosin  0.4 mg Oral QPC supper   Continuous Infusions:  PRN Meds: acetaminophen, alum & mag hydroxide-simeth, antiseptic oral rinse, bisacodyl, diphenhydrAMINE, guaiFENesin-dextromethorphan, polyethylene glycol, prochlorperazine **OR** prochlorperazine **OR** prochlorperazine, sodium chloride flush, sodium phosphate, traZODone  Allergies:    Allergies  Allergen Reactions  . Azithromycin Hives  . Penicillins Other (See Comments)    Did it involve swelling of the face/tongue/throat, SOB, or low BP? Unknown Did it involve sudden or severe rash/hives, skin peeling, or any reaction on the inside of your mouth or nose? Unknown Did you need to seek medical attention at a hospital or doctor's office? Unknown When did it last happen?childhood If all above answers are "NO", may proceed with cephalosporin use.   . Levophed [Norepinephrine Bitartrate] Other (See Comments)    unknown  . Sulfa Antibiotics Other (See Comments)    unknown    Social History:   Social History   Socioeconomic History  . Marital status: Married    Spouse name: Not on file  . Number of children: Not on file  . Years of education: Not on file  . Highest education level: Not on file  Occupational History     Employer: [employer]    Comment: Dealer  Social Needs  . Financial resource strain: Not on file  . Food insecurity    Worry: Not on file    Inability: Not on file  . Transportation needs    Medical: Not on file    Non-medical: Not on file  Tobacco Use  . Smoking status: Former Smoker    Quit date: 08/02/1996    Years since quitting: 22.7  . Smokeless tobacco: Never Used  . Tobacco comment: quit 20 years ago  Substance and Sexual Activity  . Alcohol use: Yes    Comment: Occasional  . Drug use: No  . Sexual activity: Not Currently  Lifestyle  . Physical activity    Days per week: Not on file    Minutes per session: Not on file  . Stress: Not on file  Relationships  . Social Herbalist on phone: Not on file    Gets together: Not on file    Attends religious service: Not on file    Active member of club or organization: Not on file    Attends meetings of clubs or organizations: Not on file    Relationship status: Not on file  . Intimate partner violence    Fear of current or ex partner: Not on file    Emotionally abused: Not on file    Physically abused: Not on file    Forced sexual activity: Not on file  Other Topics Concern  . Not on file  Social History Narrative   Has no children   Lives in [place]    Family History:   Family History  Problem Relation Age of Onset  . Breast cancer Mother   . Other Father        unknown death cause  . Diabetes Father    Family Status:  Family Status  Relation Name Status  . Mother  Deceased       MVC  . Father  Deceased       etoh  . Sister 1 Deceased       age 33 from Cardiac problems  . MGM  Deceased  . MGF  Deceased  . PGM  Deceased  . PGF  Deceased  . Other married Alive  . Child  no children of his own Alive    ROS:  Please see the history of present illness.  All other ROS reviewed and negative.     Physical Exam/Data:   Vitals:   04/12/19 1939 04/13/19 0000 04/13/19 0427 04/13/19  1356  BP: 126/88 (!) 114/92 134/82 119/80  Pulse: 64 76 (!) 57 63  Resp: 19  17 16   Temp: 98.9 F (37.2 C)  98.6 F (37 C) 98.2 F (36.8 C)  TempSrc:   Oral Oral  SpO2: 99% 99% 93% 100%    Intake/Output Summary (Last 24 hours) at 04/13/2019 1557 Last data filed at 04/13/2019 1500 Gross per 24 hour  Intake 1504 ml  Output 1450 ml  Net 54 ml   There were no vitals filed for this visit. There is no height or weight on file to calculate BMI.  General:  Well nourished, well developed, male in no acute distress HEENT: normal Lymph: no adenopathy Neck: JVD - not elevated Endocrine:  No thryomegaly Vascular: No carotid bruits; 4/4 extremity pulses 2+, without bruits  Cardiac:  normal S1, S2; Irreg R&R; no murmur  Lungs:  clear bilaterally, no wheezing, rhonchi or rales  Abd: soft, nontender, no hepatomegaly  Ext: Trace R edema, 1+ L edema Musculoskeletal:  No deformities, BUE and BLE strength weak but equal Skin: warm and dry  Neuro:  CNs 2-12 intact, no focal abnormalities noted Psych:  Normal affect   EKG:  The EKG was personally reviewed and demonstrates:  09/11 ECG is atrial flutter w/ 4:1 conduction, HR 65 Telemetry:  Telemetry was personally reviewed and demonstrates:  n/a   CV studies:   ECHO: at [facility] 02/22/2019 EF 60-65%, normal LV thickness Severe LA dilatation RA pressure 10 mmHg PAS 35 Aortic root mod dilated  ECHO: 01/05/2019   1. The left ventricle has normal systolic function with an ejection fraction of 60-65%. The cavity size was normal. There is mildly increased left ventricular wall thickness. Left ventricular diastolic Doppler parameters are consistent with impaired  relaxation.  2. The right ventricle has normal systolic function. The cavity was normal. There is no increase in right ventricular wall thickness.  3. Left atrial size was moderately dilated.  4. There is dilatation of the aortic root measuring 42 mm.  AAA DUPLEX: 01/12/2019  Summary: Abdominal Aorta: There is evidence of abnormal dilitation of the Proximal Abdominal aorta. There is evidence of abnormal dilation of the Right Common Iliac artery and Left Common Iliac artery at 2.5 cm and 2.1 cm respectively, which are essentially  stable compared to previous exam. The largest aortic measurement is 3.5 cm. The largest aortic diameter remains essentially unchanged compared to prior exam. Previous diameter measurement was 3.5 cm obtained on 09/23/17.   *See table(s) above for measurements and observations. Suggest follow up study in 12 months.    Laboratory Data:   Chemistry Recent Labs  Lab 04/09/19 0401 04/11/19 2230  NA 138 139  K 3.1* 3.8  CL 99 102  CO2 29 27  GLUCOSE 122* 131*  BUN 14 15  CREATININE 0.82 0.92  CALCIUM 9.1 9.1  GFRNONAA >60 >60  GFRAA >60 >60  ANIONGAP 10 10    Lab Results  Component Value Date   ALT 16 04/11/2019   AST 16 04/11/2019   ALKPHOS 100 04/11/2019   BILITOT 0.4 04/11/2019   Hematology Recent Labs  Lab 04/09/19 0401 04/11/19 2230  WBC 5.6 5.5  RBC 3.33* 3.19*  HGB 10.1* 9.6*  HCT 31.8*  31.0*  MCV 95.5 97.2  MCH 30.3 30.1  MCHC 31.8 31.0  RDW 16.3* 16.3*  PLT 177 193   Cardiac Enzymes High Sensitivity Troponin:   Recent Labs  Lab 03/28/19 1230 03/28/19 1647 03/28/19 2037  TROPONINIHS 21* 20* 21*     TSH:  Lab Results  Component Value Date   TSH 4.686 (H) 03/18/2019    HgbA1c: Lab Results  Component Value Date   HGBA1C 5.4 04/11/2019   Magnesium:  Magnesium  Date Value Ref Range Status  04/11/2019 1.6 (L) 1.7 - 2.4 mg/dL Final    Comment:    Performed at [facility] [address], [place], [place] [address]   Lab Results  Component Value Date   INR 1.2 03/31/2019     Radiology/Studies:  US Renal  Result Date: 02-May-2019 CLINICAL DATA:  68 year old male with hematuria. EXAM: RENAL / URINARY TRACT ULTRASOUND COMPLETE COMPARISON:  Renal ultrasound 03/31/2019. FINDINGS:  Right Kidney: Renal measurements: 10.5 x 5.6 x 6.4 centimeters = volume: 183 mL . Echogenicity within normal limits. No mass or hydronephrosis visualized. Left Kidney: Renal measurements: 10.9 x 5.9 x 7.6 centimeters = volume: 255 mL. Better visualized than on the August exam although still mildly obscured by bowel gas. Cortical thickness and echogenicity appear normal. No left hydronephrosis or renal mass is evident. Bladder: Foley catheter balloon in place (image 22) with decompressed urinary bladder and generalized bladder wall thickening. Other findings: Prostate with a diameter of 54 millimeters and estimated volume of 73 milliliters. IMPRESSION: 1. Bladder remains decompressed by a Foley catheter and appears thick walled as before. 2. Negative ultrasound appearance of both kidneys. 3. Prostate enlargement with estimated volume of 73 mL. Electronically Signed   By: Genevie Ann M.D.   On: 05-02-2019 09:57    Assessment and Plan:   1. Permanent Atrial flutter and/or fib - prior to admit 01/2019, he was on Cardizem CD 360 mg for rate control - Cardizem has been up-titrated from 30 mg to 60 mg q 6 hr, no doses missed.  - will change to Card CD 360 mg qd - also on amio 200 mg qd and metoprolol 25 mg bid  - HR 50s at times but no sx from this - discuss w/ MD keeping the metoprolol but d/c amio as he has not been a candidate for SR in years  2. Chronic anticoagulation 2nd Atrial flutter and portal vein thrombosis - during period when they were trying to remove Foley, he required in/out caths and had hematuria. Eliquis was held and then restarted at a lower dose. - he does not meet criteria for a lower dose (2/3 of age >5, Cr >1.5 or wt <60 kg) - CHA2DS2-VASc = 5 (age x 1, DM, HTN, DVT x 2) - discuss w/ MD increasing back to 5 mg bid and watch for bleeding.  3. Urinary retention - currently w/ indwelling Foley, urinating well   Otherwise, per rehab team and other consultants.  Active Problems:    Physical debility     For questions or updates, please contact [facility] Please consult [email or web address] for contact info under Cardiology/STEMI.   Signed, Rosaria Ferries, PA-C  04/13/2019 3:57 PM  History and all data above reviewed.  Patient examined.  I agree with the findings as above.  The patient has a protracted history as above.  Has had a long history of atrial flutter.  He has chronic anticoagulation which he tolerates aside from the recent hematuria.  His  rates have been controlled and he does not really notice the bradycardia rates.  Has had occasional tacky rates but not recently that seen.  He has any presyncope or syncope.  Denies chest discomfort, neck or arm discomfort.  He has no shortness of breath, PND or orthopnea.  He has no weight gain or edema.  He has some knee pain which is hindering what is otherwise a reasonable recovery and rehab.  The patient exam reveals [id number]  ,  Lungs: clear  ,  Abd: Positive bowel sounds, no rebound no guarding feeding tube in place, Ext Moderate edema  .  All available labs, radiology testing, previous records reviewed. Agree with documented assessment and plan.   Atrial flutter: I will plan to stop his amiodarone.  For now we will consolidate Cardizem and continue the metoprolol.  I will defer to the primary team but would like to increase his Eliquis to 5 mg unless a think is a contraindication with hematuria.  We will follow-up for further med suggestions. Jeneen Rinks Wallace Gappa  6:09 PM  04/13/2019

## 2019-04-14 ENCOUNTER — Inpatient Hospital Stay (HOSPITAL_COMMUNITY): Payer: Medicare Other | Admitting: Physical Therapy

## 2019-04-14 ENCOUNTER — Inpatient Hospital Stay (HOSPITAL_COMMUNITY): Payer: Medicare Other | Admitting: Occupational Therapy

## 2019-04-14 LAB — GLUCOSE, CAPILLARY
Glucose-Capillary: 98 mg/dL (ref 70–99)
Glucose-Capillary: 110 mg/dL — ABNORMAL HIGH (ref 70–99)
Glucose-Capillary: 121 mg/dL — ABNORMAL HIGH (ref 70–99)
Glucose-Capillary: 122 mg/dL — ABNORMAL HIGH (ref 70–99)

## 2019-04-14 MED ORDER — DILTIAZEM HCL ER COATED BEADS 180 MG PO CP24
360.0000 mg | ORAL_CAPSULE | Freq: Every day | ORAL | Status: DC
  Administered 2019-04-14 – 2019-04-18 (×5): 360 mg via ORAL
  Filled 2019-04-14 (×5): qty 2

## 2019-04-14 NOTE — Progress Notes (Signed)
[place] PHYSICAL MEDICINE & REHABILITATION PROGRESS NOTE   Subjective/Complaints: Appreciate cardiology note  Doing well with bowel and bladder. Review of systems negative for chest pain shortness of breath nausea vomiting diarrhea   Objective:   No results found. Recent Labs    04/11/19 2230  WBC 5.5  HGB 9.6*  HCT 31.0*  PLT 193   Recent Labs    04/11/19 2230  NA 139  K 3.8  CL 102  CO2 27  GLUCOSE 131*  BUN 15  CREATININE 0.92  CALCIUM 9.1    Intake/Output Summary (Last 24 hours) at 04/14/2019 1104 Last data filed at 04/14/2019 1003 Gross per 24 hour  Intake 922 ml  Output 2150 ml  Net -1228 ml     Physical Exam: Vital Signs Blood pressure (!) 145/86, pulse (!) 101, temperature 98 F (36.7 C), temperature source Oral, resp. rate 15, SpO2 93 %.     Assessment/Plan: 1. Functional deficits secondary to debility from complex medical illness which require 3+ hours per day of interdisciplinary therapy in a comprehensive inpatient rehab setting.  Physiatrist is providing close team supervision and 24 hour management of active medical problems listed below.  Physiatrist and rehab team continue to assess barriers to discharge/monitor patient progress toward functional and medical goals  Care Tool:  Bathing    Body parts bathed by patient: Right arm, Left arm, Chest, Abdomen, Front perineal area, Buttocks, Right upper leg, Left upper leg, Right lower leg, Left lower leg, Face         Bathing assist Assist Level: Supervision/Verbal cueing     Upper Body Dressing/Undressing Upper body dressing   What is the patient wearing?: Pull over shirt    Upper body assist Assist Level: Set up assist    Lower Body Dressing/Undressing Lower body dressing      What is the patient wearing?: Underwear/pull up, Pants     Lower body assist Assist for lower body dressing: Minimal Assistance - Patient > 75%     Toileting Toileting    Toileting assist  Assist for toileting: Minimal Assistance - Patient > 75%     Transfers Chair/bed transfer  Transfers assist     Chair/bed transfer assist level: Minimal Assistance - Patient > 75%     Locomotion Ambulation   Ambulation assist   Ambulation activity did not occur: Safety/medical concerns(required use of RW and was independent prior)  Assist level: Minimal Assistance - Patient > 75% Assistive device: Walker-rolling Max distance: 115ft   Walk 10 feet activity   Assist  Walk 10 feet activity did not occur: Safety/medical concerns  Assist level: Minimal Assistance - Patient > 75% Assistive device: Walker-rolling   Walk 50 feet activity   Assist Walk 50 feet with 2 turns activity did not occur: Safety/medical concerns  Assist level: Minimal Assistance - Patient > 75% Assistive device: Walker-rolling    Walk 150 feet activity   Assist Walk 150 feet activity did not occur: Safety/medical concerns  Assist level: Minimal Assistance - Patient > 75% Assistive device: Walker-rolling    Walk 10 feet on uneven surface  activity   Assist Walk 10 feet on uneven surfaces activity did not occur: Safety/medical concerns(required use of RW and was independent prior)         Wheelchair     Assist Will patient use wheelchair at discharge?: No(TBD but anticipate pt will D/C at ambulatory level)             Wheelchair 50 feet  with 2 turns activity    Assist            Wheelchair 150 feet activity     Assist          Blood pressure (!) 145/86, pulse (!) 101, temperature 98 F (36.7 C), temperature source Oral, resp. rate 15, SpO2 93 %.  Medical Problem List and Plan: 1.   Debility secondary to Pseudomonas bacteremia and sepsis PT, OT CIR  2. Antithrombotics: -DVT/anticoagulation:Pharmaceutical:Other (comment)--Eliquis cont low dose due to hx hematuria on higher dose eliquis , has hematuria 2 d ago when foley tube was not well secured,  voiding trial next wk  -antiplatelet therapy: N/A 3. Pain Management:N/A 4. Mood:LCSW to follow for evaluation and support. -antipsychotic agents: Seroquel. 5. Neuropsych: This patientiscapable of making decisions on hisown behalf. 6. Skin/Wound Care:Routine pressure relief measures. Add protein supplement to promote healing. 7. Fluids/Electrolytes/Nutrition:Monitor I/O. K+ wnl cont 69meq Kdur daily 8. A fib with RVR: Will check EKG as continues to go in and out of flutter. Continue amiodarone, metoprolol and cardizem.   Cardiology switch to cardizem CD 360mg  daily  Vitals:   04/14/19 0014 04/14/19 0530  BP: 124/80 (!) 145/86  Pulse: 82 (!) 101  Resp: 18 15  Temp:  98 F (36.7 C)  SpO2: 100% 93%  Blood pressure controlled, heart rate controlled thus far we will need to monitor and therapy 9. 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 Difficult cath due to BPH--needs coude cath. Continue flomax and will repeat trial in a few days. UCS negative--d/c rocephin Voiding trial next week 10. T2DM: Monitor BS ac/hs. Use SSI for elevated BS and resume metformin if BS trend upwards.  CBG (last 3)  Recent Labs    04/13/19 1740 04/13/19 2108 04/14/19 0626  GLUCAP 123* 104* 98  Controlled, 9/12 11. Encephalopathy: Has resolved on Seroquel. Wean  12. Hypokalemia: K+ 3.1 on 9/7-->recheck labs today.  13. Anemia of chronic illness:Likely affected byintermittent hematuria. Will follow H/H with serial checks.    LOS: 3 days A FACE TO FACE EVALUATION WAS PERFORMED  Charlett Blake 04/14/2019, 11:04 AM

## 2019-04-14 NOTE — Plan of Care (Signed)
  Problem: Consults Goal: Diabetes Guidelines if Diabetic/Glucose > 140 Description: If diabetic or lab glucose is > 140 mg/dl - Initiate Diabetes/Hyperglycemia Guidelines & Document Interventions  Outcome: Progressing   Problem: RH BLADDER ELIMINATION Goal: RH STG MANAGE BLADDER WITH ASSISTANCE Description: STG Manage Bladder With  Min Assistance Outcome: Progressing Goal: RH STG MANAGE BLADDER WITH EQUIPMENT WITH ASSISTANCE Description: STG Manage Bladder With Equipment With min Assistance Outcome: Progressing   Problem: RH SAFETY Goal: RH STG ADHERE TO SAFETY PRECAUTIONS W/ASSISTANCE/DEVICE Description: STG Adhere to Safety Precautions With  [employer] Device. Outcome: Progressing   Problem: RH PAIN MANAGEMENT Goal: RH STG PAIN MANAGED AT OR BELOW PT'S PAIN GOAL Description: 0-3 out of 10 Outcome: Progressing   Problem: RH KNOWLEDGE DEFICIT GENERAL Goal: RH STG INCREASE KNOWLEDGE OF SELF CARE AFTER HOSPITALIZATION Description: Pt will demonstrate knowledge of discharge instructions Outcome: Progressing

## 2019-04-14 NOTE — Progress Notes (Signed)
Occupational Therapy Session Note  Patient Details  Name: Andrew Compton MRN: [id number] Date of Birth: 1951/03/21  Today's Date: 04/14/2019 OT Individual Time: [id number] OT Individual Time Calculation (min): 58 min   Short Term Goals: Week 1:  OT Short Term Goal 1 (Week 1): Pt will complete toilet transfers using the RW and supervision. OT Short Term Goal 2 (Week 1): Pt will perform LB bathing and dressing sit to stand with supervision. OT Short Term Goal 3 (Week 1): Pt will complete toileting tasks sit to stand with supervision.  Skilled Therapeutic Interventions/Progress Updates:    Pt greeted in recliner with RN present. Just finishing up with morning medication. Pt requested to shower. Started with ambulatory transfer to the sink to complete oral care. Pt used RW with Min A to do so. Steady assist for standing balance at the sink. Transitioned to the TTB for showering. OT covered midline. He then bathed while seated using lean technique for perihygiene and LH sponge to reach feet. Pt opted to dress sit<stand from edge of TTB after. Able to bend forward and utilize figure 4 position with supervision assist. He also threaded his foley with vcs. Min A for dynamic standing balance during LB dressing. Pt then stood at the sink to complete hair combing, hand washing, and clean ears with q-tips. Steady assist for balance once again at the sink. Pt ambulated back to the recliner and was left with all needs within reach and chair alarm set. Tx focus placed on dynamic balance, functional ambulation with device, and ADL retraining.     Therapy Documentation Precautions:  Precautions Precautions: Fall Restrictions Weight Bearing Restrictions: No Vital Signs: Therapy Vitals Temp: 97.8 F (36.6 C) Pulse Rate: 73 Resp: 16 BP: 136/89 Patient Position (if appropriate): Sitting Oxygen Therapy SpO2: 98 % O2 Device: Room Air Pain: Min c/o Lt knee pain when ambulating. Per pt, pain was  manageable during tx   ADL: ADL Eating: Independent Where Assessed-Eating: Edge of bed Grooming: Minimal assistance Where Assessed-Grooming: Standing at sink Upper Body Bathing: Setup Where Assessed-Upper Body Bathing: Edge of bed Lower Body Bathing: Minimal assistance Where Assessed-Lower Body Bathing: Edge of bed Upper Body Dressing: Supervision/safety Where Assessed-Upper Body Dressing: Chair Lower Body Dressing: Minimal assistance Where Assessed-Lower Body Dressing: Wheelchair Toileting: Minimal assistance Where Assessed-Toileting: Glass blower/designer: Psychiatric nurse Method: Counselling psychologist: Raised toilet seat      Therapy/Group: Individual Therapy  Pooja Camuso A Burnett Spray 04/14/2019, 4:07 PM

## 2019-04-14 NOTE — Progress Notes (Signed)
Occupational Therapy Session Note  Patient Details  Name: Andrew Compton MRN: [id number] Date of Birth: [birth year]  Today's Date: 04/14/2019 OT Individual Time: 1030-1130 OT Individual Time Calculation (min): 60 min    Short Term Goals: Week 2:   see eval  Skilled Therapeutic Interventions/Progress Updates: focus this session as indicated on primary OT non B/D ideas to help increase independence toward goas as follows:  Sit to stand traning in prep for self care and functional mobility and transfers=supervision and cues to scoot forward in chair; however, patient tended to pull up onto walker each sit to stand and required tactile or verbal cue for technique to push up off surface with at least one hand  Shower transfer simulated like his shower at home (left opening door and approx 1" threshold)= CGA via RW.   He approached 'shower door' with walker in front and placed walker to his left side to step over threshold and balance onto shower wall or grab bar.  Toilet transfer and furntiture transfers= close S stand pivot or squat pivot  He also tolerated well overall endurance, strengthening, core strengthening and UE strengthening to increase safety, transfers and independence toward self care.  O2 at 97 during and after strengtheing portions of sessions.  Patient was left with call bell, alarm and phone in place at the end of the session,  Continue OT Plan of Care  Therapy Documentation Precautions:  Precautions Precautions: Fall Restrictions Weight Bearing Restrictions: No  Pain:denied   Therapy/Group: Individual Therapy  Alfredia Ferguson [facility] 04/14/2019, 4:30 PM

## 2019-04-14 NOTE — Progress Notes (Addendum)
Progress Note  Patient Name: Andrew Compton Date of Encounter: 04/14/2019  Primary Cardiologist: [name], MD   Subjective   Denies any chest pain or palpitations  Inpatient Medications    Scheduled Meds: . amantadine  100 mg Oral Daily  . apixaban  2.5 mg Oral BID  . Chlorhexidine Gluconate Cloth  6 each Topical Daily  . diclofenac sodium  2 g Topical QID  . diltiazem  60 mg Oral Q6H  . insulin aspart  0-5 Units Subcutaneous QHS  . insulin aspart  0-9 Units Subcutaneous TID WC  . levothyroxine  50 mcg Oral Q0600  . Melatonin  3 mg Oral QHS  . metoprolol tartrate  25 mg Oral BID  . multivitamin with minerals  1 tablet Oral Daily  . potassium chloride  20 mEq Oral Daily  . QUEtiapine  25 mg Oral BID  . sertraline  50 mg Oral Daily  . sodium chloride flush  10-40 mL Intracatheter Q12H  . tamsulosin  0.4 mg Oral QPC supper   Continuous Infusions:  PRN Meds: acetaminophen, alum & mag hydroxide-simeth, antiseptic oral rinse, bisacodyl, diphenhydrAMINE, guaiFENesin-dextromethorphan, polyethylene glycol, prochlorperazine **OR** prochlorperazine **OR** prochlorperazine, sodium chloride flush, sodium phosphate, traZODone   Vital Signs    Vitals:   04/13/19 1922 04/13/19 1933 04/14/19 0014 04/14/19 0530  BP: (!) 150/112 132/84 124/80 (!) 145/86  Pulse: (!) 127 100 82 (!) 101  Resp: 20  18 15   Temp: (!) 97.5 F (36.4 C)   98 F (36.7 C)  TempSrc: Oral   Oral  SpO2: 99%  100% 93%    Intake/Output Summary (Last 24 hours) at 04/14/2019 0944 Last data filed at 04/14/2019 0837 Gross per 24 hour  Intake 922 ml  Output 1950 ml  Net -1028 ml   There were no vitals filed for this visit.  Telemetry    Atrial flutter with HR 82-101bpm - Personally Reviewed  ECG    No new EKG to review - Personally Reviewed  Physical Exam   GEN: No acute distress.   Neck: No JVD Cardiac: irregularly irreguar, no murmurs, rubs, or gallops.  Respiratory: Clear to auscultation  bilaterally. GI: Soft, nontender, non-distended  MS: No edema; No deformity. Neuro:  Nonfocal  Psych: Normal affect   Labs    Chemistry Recent Labs  Lab 04/09/19 0401 04/11/19 2230  NA 138 139  K 3.1* 3.8  CL 99 102  CO2 29 27  GLUCOSE 122* 131*  BUN 14 15  CREATININE 0.82 0.92  CALCIUM 9.1 9.1  PROT 6.3* 6.7  ALBUMIN 2.6* 2.7*  AST 16 16  ALT 14 16  ALKPHOS 99 100  BILITOT 0.6 0.4  GFRNONAA >60 >60  GFRAA >60 >60  ANIONGAP 10 10     Hematology Recent Labs  Lab 04/09/19 0401 04/11/19 2230  WBC 5.6 5.5  RBC 3.33* 3.19*  HGB 10.1* 9.6*  HCT 31.8* 31.0*  MCV 95.5 97.2  MCH 30.3 30.1  MCHC 31.8 31.0  RDW 16.3* 16.3*  PLT 177 193    Cardiac EnzymesNo results for input(s): TROPONINI in the last 168 hours. No results for input(s): TROPIPOC in the last 168 hours.   BNPNo results for input(s): BNP, PROBNP in the last 168 hours.   DDimer No results for input(s): DDIMER in the last 168 hours.   Radiology    No results found.  Cardiac Studies   2D echo IMPRESSIONS    1. The left ventricle has normal systolic function with  an ejection fraction of 60-65%. The cavity size was normal. There is mildly increased left ventricular wall thickness. Left ventricular diastolic Doppler parameters are consistent with impaired  relaxation.  2. The right ventricle has normal systolic function. The cavity was normal. There is no increase in right ventricular wall thickness.  3. Left atrial size was moderately dilated.  4. There is dilatation of the aortic root measuring 42 mm.  Patient Profile     68 y.o. male with a hx of permanent Afib on Cardizem and Eliquis, nl LV on echo 2014, DM, HTN, AAA & iliac aneurysms, GERD, melanoma, who is being seen today for the evaluation of med mgt at the request of Dr Posey Pronto.   Assessment & Plan    1. Permanent Atrial flutter  - prior to admit 01/2019, he was on Cardizem CD 360 mg for rate control - Placed on Cardizem short acting  had been up-titrated from 30 mg to 60 mg q 6 hr, no doses missed.  - was supposed to be changed to Cardizem CD 360mg  yesterday (home dose) but not ordered -change back to home dose of Cardizem CD 360mg  daily - Amio stopped due to permanent aflutter with on plans for rhythm maintenance.   - continue metoprolol 25 mg bid  - HR 57-100bpm.   2. Chronic anticoagulation  -2nd Atrial flutter and portal vein thrombosis - during period when they were trying to remove Foley, he required in/out caths and had hematuria. Eliquis was held and then restarted at a lower dose. - he does not meet criteria for a lower dose (2/3 of age >31, Cr >1.5 or wt <60 kg) - CHA2DS2-VASc = 5 (age x 1, DM, HTN, DVT x 2) - Recommend going back to Eliquis 5mg  BID if felt safe at this point from urologic standpoint.   3. Urinary retention - currently w/ indwelling Foley, urinating well  4.  HTN -BP borderline elevated but should improve once back on home dose of Cardizem CD 360mg  daily. -continue Lopressor 25mg  BID.  [facility] will sign off.   Medication Recommendations:  Cardizem CD 360mg  daily, increase Eliquis to 5mg  BID, Lopressor 25mg  BID Other recommendations (labs, testing, etc):  none Follow up as an outpatient:  Followup in 2 weeks with Dr. Stanford Breed  For questions or updates, please contact [place] Please consult [email or web address] for contact info under Cardiology/STEMI.      Signed, Fransico Him, MD  04/14/2019, 9:44 AM

## 2019-04-14 NOTE — Progress Notes (Signed)
Physical Therapy Session Note  Patient Details  Name: Andrew Compton MRN: [id number] Date of Birth: [birth year]  Today's Date: 04/14/2019 PT Individual Time: [id number] PT Individual Time Calculation (min): 58 min   Short Term Goals: Week 1:  PT Short Term Goal 1 (Week 1): Pt will perform sit<>stand transfers with CGA PT Short Term Goal 2 (Week 1): Pt will perform bed<>chair transfers using LRAD with CGA PT Short Term Goal 3 (Week 1): Pt will ambulate at least 14ft using LRAD with CGA PT Short Term Goal 4 (Week 1): Pt will ascend/descend 8 steps using handrails with CGA  Skilled Therapeutic Interventions/Progress Updates:   Pt received sitting in recliner and agreeable to therapy session. Sit>stand recliner>RW with pt requiring a few attempts prior to achieving full standing from this lower surface with CGA/min assist for steadying/balance. Ambulated ~112ft using RW to therapy gym with CGA for steadying and towards end of walk pt started demonstrating L knee buckling but able to recover with B UE support on AD. Repeated sit<>stands elevated EOM<>minimal to no UE support 2 sets of 15 reps targeting B LE strengthening - CGA for steadying/safety and 1x min assist due to minor R lateral LOB. Ambulated ~59ft x2 to/from stairs using RW with CGA for steadying. Ascended/descended 8 steps using bilateral handrails with min assist - step-to pattern, up with R LE and down with L LE due to L knee pain - cuing for sequencing. Performed alternate B LE foot taps on 6" step with B UE support on RW and CGA/min assist for balance - 3 sets to fatigue (~1-1.5 minutes each). Progressed to foot taps without UE support with pt requiring mod assist for balance and therapist guarding L knee buckling for safety. Sit>supine with supervision. Supine B LE bridging x15 progressed to one leg bias x15 reps each LE with cuing for increased hip extension motion. Supine>sit with supervision. Ambulated ~145ft back to room using RW  with CGA for steadying. Pt left sitting in recliner with needs in reach and chair alarm on.  Therapy Documentation Precautions:  Precautions Precautions: Fall Restrictions Weight Bearing Restrictions: No  Pain:  Continues to report L knee pain during terminal knee extension in weightbearing - avoided repeating this action during therapy session for pain management. Per this therapist it appears the L knee swelling has decreased compared to yesterday.   Therapy/Group: Individual Therapy  Tawana Scale, PT, DPT 04/14/2019, 2:23 PM

## 2019-04-15 ENCOUNTER — Inpatient Hospital Stay (HOSPITAL_COMMUNITY): Payer: Medicare Other | Admitting: Physical Therapy

## 2019-04-15 DIAGNOSIS — I4892 Unspecified atrial flutter: Secondary | ICD-10-CM

## 2019-04-15 DIAGNOSIS — I10 Essential (primary) hypertension: Secondary | ICD-10-CM

## 2019-04-15 DIAGNOSIS — R310 Gross hematuria: Secondary | ICD-10-CM

## 2019-04-15 LAB — GLUCOSE, CAPILLARY
Glucose-Capillary: 111 mg/dL — ABNORMAL HIGH (ref 70–99)
Glucose-Capillary: 103 mg/dL — ABNORMAL HIGH (ref 70–99)
Glucose-Capillary: 99 mg/dL (ref 70–99)
Glucose-Capillary: 118 mg/dL — ABNORMAL HIGH (ref 70–99)

## 2019-04-15 NOTE — Progress Notes (Signed)
Physical Therapy Session Note  Patient Details  Name: Andrew Compton MRN: [id number] Date of Birth: Nov 16, 1950  Today's Date: 04/15/2019 PT Individual Time: [id number] PT Individual Time Calculation (min): 58 min   Short Term Goals: Week 1:  PT Short Term Goal 1 (Week 1): Pt will perform sit<>stand transfers with CGA PT Short Term Goal 2 (Week 1): Pt will perform bed<>chair transfers using LRAD with CGA PT Short Term Goal 3 (Week 1): Pt will ambulate at least 157ft using LRAD with CGA PT Short Term Goal 4 (Week 1): Pt will ascend/descend 8 steps using handrails with CGA  Skilled Therapeutic Interventions/Progress Updates:    Pt received sitting in recliner and agreeable to therapy session. Sit>stand recliner>RW with pt requiring 2 attempts then came to standing with CGA for steadying. Ambulated ~113ft using RW to therapy gym with CGA for steadying - continues to demonstrate antalgic gait with decreased L LE terminal knee extension and weightbearing during stance with decreased gait speed. Repeated sit<>mini-squats (avoiding full L knee extension) to/from elevated EOM with minimal UE support with min assist for lifting/lowering and balance - 3 sets to fatigue - mirror feedback with cuing for increased anterior trunk lean and manual facilitation for proper technique. Standing balance task of repeated L LE step up/downs on 6" step without UE support with min assist for balance - pt unable to perform R LE step up/downs due to L knee pain. Sit>supine on mat with supervision.  Performed the following B LE strengthening exercises 2 sets to fatigue (~15reps each) in supine: - straight leg raises with multimodal cuing for proper form/technique  - bridging with level 1 theraband around knees to increase hip abductor muscle activation - multimodal cuing for proper form/technique  Supine>sit on mat with supervision. Ambulated ~150ft back to room using RW with CGA/close sueprvision for steadying/safety -  demonstrates slightly less antalgic gait with ACE wrap on. Pt left sitting in recliner with needs in reach and chair alarm on.  Therapy Documentation Precautions:  Precautions Precautions: Fall Restrictions Weight Bearing Restrictions: No  Pain: Continues to report L knee pain rated as 8-10/10 while ambulating stating "I don't think it is getting any better." continues to report pain is during terminal knee extension in standing L LE weightbearing. Therapist wrapped L knee with ACE wrap and avoided aggravating positions during therapy session for pain management. Therapist provided ice for L knee at end of session.   Therapy/Group: Individual Therapy  Tawana Scale, PT, DPT 04/15/2019, 3:34 PM

## 2019-04-15 NOTE — Progress Notes (Signed)
[facility] PHYSICAL MEDICINE & REHABILITATION PROGRESS NOTE   Subjective/Complaints: No issues overnite , RN asking about midline removal, pt eating and drinking well no IV meds, labs reviewed   Pt asking about PEG removal was placed 8/10 in [facility]   Doing well with bowel and bladder. Review of systems negative for chest pain shortness of breath nausea vomiting diarrhea   Objective:   No results found. No results for input(s): WBC, HGB, HCT, PLT in the last 72 hours. No results for input(s): NA, K, CL, CO2, GLUCOSE, BUN, CREATININE, CALCIUM in the last 72 hours.  Intake/Output Summary (Last 24 hours) at 04/15/2019 1112 Last data filed at 04/15/2019 0811 Gross per 24 hour  Intake 730 ml  Output 900 ml  Net -170 ml     Physical Exam: Vital Signs Blood pressure (!) 129/95, pulse 79, temperature 98.3 F (36.8 C), temperature source Oral, resp. rate 16, SpO2 94 %.     Assessment/Plan: 1. Functional deficits secondary to debility from complex medical illness which require 3+ hours per day of interdisciplinary therapy in a comprehensive inpatient rehab setting.  Physiatrist is providing close team supervision and 24 hour management of active medical problems listed below.  Physiatrist and rehab team continue to assess barriers to discharge/monitor patient progress toward functional and medical goals  Care Tool:  Bathing    Body parts bathed by patient: Right arm, Left arm, Chest, Abdomen, Front perineal area, Buttocks, Right upper leg, Left upper leg, Right lower leg, Left lower leg, Face         Bathing assist Assist Level: Supervision/Verbal cueing     Upper Body Dressing/Undressing Upper body dressing   What is the patient wearing?: Pull over shirt    Upper body assist Assist Level: Set up assist    Lower Body Dressing/Undressing Lower body dressing      What is the patient wearing?: Underwear/pull up, Pants     Lower body assist Assist for lower body  dressing: Minimal Assistance - Patient > 75%     Toileting Toileting    Toileting assist Assist for toileting: Minimal Assistance - Patient > 75%     Transfers Chair/bed transfer  Transfers assist     Chair/bed transfer assist level: Contact Guard/Touching assist     Locomotion Ambulation   Ambulation assist   Ambulation activity did not occur: Safety/medical concerns(required use of RW and was independent prior)  Assist level: Contact Guard/Touching assist Assistive device: Walker-rolling Max distance: 129ft   Walk 10 feet activity   Assist  Walk 10 feet activity did not occur: Safety/medical concerns  Assist level: Contact Guard/Touching assist Assistive device: Walker-rolling   Walk 50 feet activity   Assist Walk 50 feet with 2 turns activity did not occur: Safety/medical concerns  Assist level: Contact Guard/Touching assist Assistive device: Walker-rolling    Walk 150 feet activity   Assist Walk 150 feet activity did not occur: Safety/medical concerns  Assist level: Contact Guard/Touching assist Assistive device: Walker-rolling    Walk 10 feet on uneven surface  activity   Assist Walk 10 feet on uneven surfaces activity did not occur: Safety/medical concerns(required use of RW and was independent prior)         Wheelchair     Assist Will patient use wheelchair at discharge?: No(TBD but anticipate pt will D/C at ambulatory level)             Wheelchair 50 feet with 2 turns activity    Assist  Wheelchair 150 feet activity     Assist          Blood pressure (!) 129/95, pulse 79, temperature 98.3 F (36.8 C), temperature source Oral, resp. rate 16, SpO2 94 %.  Medical Problem List and Plan: 1.   Debility secondary to Pseudomonas bacteremia and sepsis PT, OT CIR  2. Antithrombotics: -DVT/anticoagulation:Pharmaceutical:Other (comment)--Eliquis cont low dose due to hx hematuria on higher dose  eliquis , has hematuria 2 d ago when foley tube was not well secured, voiding trial next wk  -antiplatelet therapy: N/A 3. Pain Management:N/A 4. Mood:LCSW to follow for evaluation and support. -antipsychotic agents: Seroquel. 5. Neuropsych: This patientiscapable of making decisions on hisown behalf. 6. Skin/Wound Care:Routine pressure relief measures. Add protein supplement to promote healing. 7. Fluids/Electrolytes/Nutrition:Monitor I/O. K+ wnl cont 69meq Kdur daily , d/c midline          8. A fib with RVR: Will check EKG as continues to go in and out of flutter. Continue amiodarone, metoprolol and cardizem.   Cardiology switch to cardizem CD 360mg  daily  Vitals:   04/14/19 1917 04/15/19 0511  BP: 126/82 (!) 129/95  Pulse: 69 79  Resp: 16 16  Temp: 98 F (36.7 C) 98.3 F (36.8 C)  SpO2: 98% 94%  Blood pressure controlled, heart rate controlled 9/13 9. 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 Difficult cath due to BPH--needs coude cath. Continue flomax and will repeat trial in a few days. UCS negative--d/c rocephin Voiding trial next week 10. T2DM: Monitor BS ac/hs. Use SSI for elevated BS and resume metformin if BS trend upwards.  CBG (last 3)  Recent Labs    04/14/19 1629 04/14/19 2051 04/15/19 0616  GLUCAP 121* 122* 111*  Controlled, 9/13 11. Encephalopathy: Has resolved on Seroquel. Wean  12. Hypokalemia: K+ 3.1 on 9/7-->recheck labs today.  13. Anemia of chronic illness:Likely affected byintermittent hematuria. Will follow H/H with serial checks.    LOS: 4 days A FACE TO FACE EVALUATION WAS PERFORMED  Charlett Blake 04/15/2019, 11:12 AM

## 2019-04-15 NOTE — Plan of Care (Signed)
  Problem: Consults Goal: Diabetes Guidelines if Diabetic/Glucose > 140 Description: If diabetic or lab glucose is > 140 mg/dl - Initiate Diabetes/Hyperglycemia Guidelines & Document Interventions  Outcome: Progressing   Problem: RH BLADDER ELIMINATION Goal: RH STG MANAGE BLADDER WITH ASSISTANCE Description: STG Manage Bladder With  Min Assistance Outcome: Progressing Goal: RH STG MANAGE BLADDER WITH EQUIPMENT WITH ASSISTANCE Description: STG Manage Bladder With Equipment With min Assistance Outcome: Progressing   Problem: RH SAFETY Goal: RH STG ADHERE TO SAFETY PRECAUTIONS W/ASSISTANCE/DEVICE Description: STG Adhere to Safety Precautions With  [employer] Device. Outcome: Progressing   Problem: RH PAIN MANAGEMENT Goal: RH STG PAIN MANAGED AT OR BELOW PT'S PAIN GOAL Description: 0-3 out of 10 Outcome: Progressing   Problem: RH KNOWLEDGE DEFICIT GENERAL Goal: RH STG INCREASE KNOWLEDGE OF SELF CARE AFTER HOSPITALIZATION Description: Pt will demonstrate knowledge of discharge instructions Outcome: Progressing   Problem: Consults Goal: RH GENERAL PATIENT EDUCATION Description: See Patient Education module for education specifics. Outcome: Progressing

## 2019-04-16 ENCOUNTER — Inpatient Hospital Stay (HOSPITAL_COMMUNITY): Payer: Medicare Other | Admitting: Occupational Therapy

## 2019-04-16 ENCOUNTER — Inpatient Hospital Stay (HOSPITAL_COMMUNITY): Payer: Medicare Other

## 2019-04-16 ENCOUNTER — Encounter (HOSPITAL_COMMUNITY): Payer: Medicare Other | Admitting: Psychology

## 2019-04-16 ENCOUNTER — Telehealth: Payer: Self-pay | Admitting: Cardiology

## 2019-04-16 LAB — BASIC METABOLIC PANEL
Anion gap: 9 (ref 5–15)
BUN: 11 mg/dL (ref 8–23)
CO2: 28 mmol/L (ref 22–32)
Calcium: 9.4 mg/dL (ref 8.9–10.3)
Chloride: 103 mmol/L (ref 98–111)
Creatinine, Ser: 0.79 mg/dL (ref 0.61–1.24)
GFR calc Af Amer: >60 mL/min (ref >60)
GFR calc non Af Amer: >60 mL/min (ref >60)
Glucose, Bld: 131 mg/dL — ABNORMAL HIGH (ref 70–99)
Potassium: 3.5 mmol/L (ref 3.5–5.1)
Sodium: 140 mmol/L (ref 135–145)

## 2019-04-16 LAB — CBC WITH DIFFERENTIAL/PLATELET
Abs Immature Granulocytes: 0.02 10*3/uL (ref 0.00–0.07)
Basophils Absolute: 0 10*3/uL (ref 0.0–0.1)
Basophils Relative: 1 %
Eosinophils Absolute: 0.2 10*3/uL (ref 0.0–0.5)
Eosinophils Relative: 4 %
HCT: 34.8 % — ABNORMAL LOW (ref 39.0–52.0)
Hemoglobin: 10.7 g/dL — ABNORMAL LOW (ref 13.0–17.0)
Immature Granulocytes: 0 %
Lymphocytes Relative: 28 %
Lymphs Abs: 1.3 10*3/uL (ref 0.7–4.0)
MCH: 30.2 pg (ref 26.0–34.0)
MCHC: 30.7 g/dL (ref 30.0–36.0)
MCV: 98.3 fL (ref 80.0–100.0)
Monocytes Absolute: 0.3 10*3/uL (ref 0.1–1.0)
Monocytes Relative: 7 %
Neutro Abs: 2.7 10*3/uL (ref 1.7–7.7)
Neutrophils Relative %: 60 %
Platelets: 186 10*3/uL (ref 150–400)
RBC: 3.54 MIL/uL — ABNORMAL LOW (ref 4.22–5.81)
RDW: 16.2 % — ABNORMAL HIGH (ref 11.5–15.5)
WBC: 4.6 10*3/uL (ref 4.0–10.5)
nRBC: 0 % (ref 0.0–0.2)

## 2019-04-16 LAB — GLUCOSE, CAPILLARY
Glucose-Capillary: 106 mg/dL — ABNORMAL HIGH (ref 70–99)
Glucose-Capillary: 86 mg/dL (ref 70–99)
Glucose-Capillary: 103 mg/dL — ABNORMAL HIGH (ref 70–99)
Glucose-Capillary: 124 mg/dL — ABNORMAL HIGH (ref 70–99)

## 2019-04-16 LAB — MAGNESIUM: Magnesium: 1.5 mg/dL — ABNORMAL LOW (ref 1.7–2.4)

## 2019-04-16 MED ORDER — MAGNESIUM SULFATE 2 GM/50ML IV SOLN
2.0000 g | Freq: Once | INTRAVENOUS | Status: DC
  Filled 2019-04-16: qty 50

## 2019-04-16 MED ORDER — MAGNESIUM OXIDE 400 (241.3 MG) MG PO TABS
200.0000 mg | ORAL_TABLET | Freq: Two times a day (BID) | ORAL | Status: DC
  Administered 2019-04-17 – 2019-04-26 (×19): 200 mg via ORAL
  Filled 2019-04-16 (×19): qty 1

## 2019-04-16 MED ORDER — PANTOPRAZOLE SODIUM 40 MG PO TBEC
40.0000 mg | DELAYED_RELEASE_TABLET | Freq: Every day | ORAL | Status: DC
  Administered 2019-04-16 – 2019-04-26 (×11): 40 mg via ORAL
  Filled 2019-04-16 (×11): qty 1

## 2019-04-16 MED ORDER — POTASSIUM CHLORIDE CRYS ER 20 MEQ PO TBCR
40.0000 meq | EXTENDED_RELEASE_TABLET | Freq: Once | ORAL | Status: AC
  Administered 2019-04-16: 40 meq via ORAL
  Filled 2019-04-16: qty 2

## 2019-04-16 NOTE — Progress Notes (Signed)
Occupational Therapy Session Note  Patient Details  Name: Andrew Compton MRN: [id number] Date of Birth: 10-Jun-1951  Today's Date: 04/16/2019 OT Individual Time: 1005-1100 OT Individual Time Calculation (min): 55 min    Short Term Goals: Week 1:  OT Short Term Goal 1 (Week 1): Pt will complete toilet transfers using the RW and supervision. OT Short Term Goal 2 (Week 1): Pt will perform LB bathing and dressing sit to stand with supervision. OT Short Term Goal 3 (Week 1): Pt will complete toileting tasks sit to stand with supervision.  Skilled Therapeutic Interventions/Progress Updates:    Treatment session with focus on dynamic standing balance and balance reactions during self-care retraining with bathing at shower level.  Pt ambulated to room shower with RW and min assist.  Pt completed all bathing at seated level with lateral leans.  Dressing completed at sit > stand level for LB dressing with min assist for standing balance.  Pt donned shirt in standing with min assist for standing balance and pt with 1 LOB backwards with minimal balance reactions, requiring max assist to regain.  Engaged in [place] with 2# medicine ball in sitting progressing to standing to further challenge balance.  Pt remained upright in recliner with legs elevated for edema management and all needs in reach.  Therapy Documentation Precautions:  Precautions Precautions: Fall Restrictions Weight Bearing Restrictions: No General:   Vital Signs: Therapy Vitals Temp: (!) 97.5 F (36.4 C) Pulse Rate: 69 Resp: 16 BP: 115/77 Patient Position (if appropriate): Sitting Oxygen Therapy SpO2: 97 % O2 Device: Room Air Pain:  Pt with no c/o pain   Therapy/Group: Individual Therapy  Simonne Come 04/16/2019, 3:16 PM

## 2019-04-16 NOTE — Progress Notes (Signed)
Orthopedic Tech Progress Note Patient Details:  Andrew Compton 16-Oct-1950 [id number] Called hanger for knee brace. Patient ID: Andrew Compton, male   DOB: 05/27/1951, 68 y.o.   MRN: [id number]   Melony Overly T 04/16/2019, 3:02 PM

## 2019-04-16 NOTE — Telephone Encounter (Signed)
LVM for patient to call and schedule 2 week hospital followup with Dr. Jacalyn Lefevre team.

## 2019-04-16 NOTE — Consult Note (Addendum)
Neuropsychological Consultation   Patient:   Andrew Compton   DOB:   [birth year]  MR Number:  [id number]  Location:  [facility] [place] [id number] [place] [address] Dept: [place]: [phone]           Date of Service:   04/16/2019  Start Time:   8 AM End Time:   9 AM  Provider/Observer:  Ilean Skill, Psy.D.       Clinical Neuropsychologist       Billing Code/Service: [phone]  Chief Complaint:    Andrew Compton is a 68 year old male with history of Diabetes, melanoma, GERD, gout, HTN, brachial plexus injury BUE, PAF initially admitted on 02/20/2019 due to acute pancreatitis.  Hypoxic due to septic shock requiring intubation.  Developed PNA, A fib and encephalopathy.  After course of antibiotics was able to tolerate vent wean.  Hospital course significant for agitation, code blue 8/25, electrolyte abnormalities, hematuria and Afib.  Patient was admitted to CIR due to functional decline.    Reason for Service:  The patient was admitted for neuropsychological consultation due to coping and adjustment issues.  Below is the HPI for the current admission.  [id number] Andrew Compton is a 68 year old male with history of T2DM, melanoma, GERD, gout, HTN, brachial plexus injury BUE, PAF- on eliquis who was admitted [facility] on 02/20/19 withsever abdominal pain, nauseas and abdominal distension with CT abdomen revealing.acute pancreatitis. He was hypoxic on admission and continued to worsen requiring intubation as well as levophed due to septic shock. Hospital coursecomplicated by acute respiratory failure with ARDS, severesepsis due to Pseudomonasbacteremiaas well aspseudomonas PNA, A fib with flutterand encephalopathy. He was started on meropenum and pancreatitis treated with conservative care. Follow up MRCP showed cholelithiasis without cholecystitis, thrombosed left  portal vein and improvement in pancreatic edema. EEG done due to decrease in LOC showing nonspecific moderate encephalopathy. He required tracheostomy and PEG placement on 08/12due to inability to wean off vent.He was transferred to [facility] on 03/16/19 for further management.  He completed his course of meropenum for recurrent Pseudomonas PNA and was able to tolerate vent wean. Hospital course significant for issues with agitation, code blue 8/25 with weaning attempts, electrolyte abnormalities, hematuria as well as A fib with RVR. BB added and weaned off IV Cardizem but continues to have tachycardia with activity requiring prn IV lopressor. He tolerated capping of trach 9/3, was started on regular diet and decannulated by 9/6. He has had issues with hematuria (pulling on foley) 8/29 and renal ultrasound showed decompressed bladder with thick wall. Foley d/c with attempts at voiding trials but he has had retention requiring I/O caths and recurrent bleeding since 9/6 therefore foley replaced. Follow up renal ultrasound 9/8 showed decompressed bladder with enlarged prostate and normal echogenicity. Blood thinners have been held on and off due to hematuria----low dose Eliquis resumed on 09/08 and Rocephin added due to concerns of UTI. Therapy has been ongoing and patient continues to be limited by debility affecting ADLs and mobility. CIR recommended due to functional decline.   Current Status:  Patient reports that his cognition has improved with overall medical improvement.  Continues with pain in knee but motivated to work hard in rehab so he can return home.  Reports that he is coping well with rehab and mood is good but really looking forward to getting stronger and going home when can.    Behavioral Observation:  Andrew Compton  presents as a 68 y.o.-year-old Right Caucasian Male who appeared his stated age. his dress was Appropriate and he was Well Groomed and his manners were Appropriate to the  situation.  his participation was indicative of Appropriate and Redirectable behaviors.  There were any physical disabilities noted.  he displayed an appropriate level of cooperation and motivation.     Interactions:    Active Appropriate and Redirectable  Attention:   within normal limits and attention span and concentration were age appropriate  Memory:   within normal limits; recent and remote memory intact  Visuo-spatial:  not examined  Speech (Volume):  low  Speech:   normal; normal  Thought Process:  Coherent and Relevant  Though Content:  WNL; not suicidal and not homicidal  Orientation:   person, place, time/date and situation  Judgment:   Good  Planning:   Good  Affect:    Appropriate  Mood:    Dysphoric  Insight:   Good  Intelligence:   normal  Medical History:   Past Medical History:  Diagnosis Date  . Acute on chronic respiratory failure with hypoxia ([place])   . Acute pancreatitis with uninfected necrosis, unspecified   . Chronic atrial fibrillation   . Essential hypertension, benign   . GERD (gastroesophageal reflux disease)   . Gouty arthropathy, unspecified   . Hypothyroidism   . Melanoma ([place])   . Metabolic encephalopathy   . Other and unspecified hyperlipidemia   . Paroxysmal atrial fibrillation (H[facility])   . Pneumonia due to Pseudomonas aeruginosa ([place])   . Severe sepsis [facility])    Psychiatric History:  Patient denies past psychiatric history.  Family Med/Psych History:  Family History  Problem Relation Age of Onset  . Breast cancer Mother   . Other Father        unknown death cause  . Diabetes Father     Impression/DX:  Andrew Compton is a 68 year old male with history of Diabetes, melanoma, GERD, gout, HTN, brachial plexus injury BUE, PAF initially admitted on 02/20/2019 due to acute pancreatitis.  Hypoxic due to septic shock requiring intubation.  Developed PNA, A fib and encephalopathy.  After course of antibiotics was able to tolerate vent  wean.  Hospital course significant for agitation, code blue 8/25, electrolyte abnormalities, hematuria and Afib.  Patient was admitted to CIR due to functional decline.    Patient reports that his cognition has improved with overall medical improvement.  Continues with pain in knee but motivated to work hard in rehab so he can return home.  Reports that he is coping well with rehab and mood is good but really looking forward to getting stronger and going home when can.  Diagnosis:   Debility        Electronically Signed   _______________________ Ilean Skill, Psy.D.

## 2019-04-16 NOTE — Progress Notes (Signed)
Physical Therapy Session Note  Patient Details  Name: Andrew Compton MRN: [id number] Date of Birth: Dec 12, 1950  Today's Date: 04/16/2019 PT Individual Time: 1333-1430 and 1535-1630 PT Individual Time Calculation (min): 57 min and 55 min  Short Term Goals: Week 1:  PT Short Term Goal 1 (Week 1): Pt will perform sit<>stand transfers with CGA PT Short Term Goal 2 (Week 1): Pt will perform bed<>chair transfers using LRAD with CGA PT Short Term Goal 3 (Week 1): Pt will ambulate at least 111ft using LRAD with CGA PT Short Term Goal 4 (Week 1): Pt will ascend/descend 8 steps using handrails with CGA  Skilled Therapeutic Interventions/Progress Updates:   Session 1:  Pt seated in recliner upon PT arrival, agreeable to therapy tx and denies pain at rest, pt reports increased pain in L knee with weightbearing. Pt transferred to standing with supervision, ambulated to the dayroom with RW and CGA x 120 ft. Pt transferred to nustep and used x 10 minutes on workload 5 for global strengthening and endurance. Pt ambulated to therapy gym with RW and CGA x 150 ft, continues to c/o L knee pain with weightbearing, pt reports pain above patella. Pt and therapist discussed trying a more supportive knee brace for pain management. Pt transferred to supine and performed 2 x 10 of the following exercises for strengthening, cues for techniques: bridges, SLR and sidelying hip abduction. Pt transferred back to sitting with supervision. Pt performed standing mini squats this session working on LE strength and knee control, without UE support, 2 x 10. Pt ambulated back to room and left in recliner with needs in reach and chair alarm set, ice applied for pain management.   Session 2: Pt seated in recliner upon PT arrival, agreeable to therapy tx and denies pain at rest, pain 7/10 in L knee during ambulation. Pt ambulated to the gym x 200 ft with RW and supervision. Pt and therapist discussed d/c planning, including OPT vs  HHPT, pt reports his wife can drive him. Pt ambulated to the steps and ascended/descended 4 steps this session with L rail and min-mod assist, when descending pt's L knee buckled and pt required mod assist to recover and prevent fall. Pt worked on L knee strength and control to perform 2 x 10 lateral step ups on 3 inch step with UE support on rail. Pt ambulated x 40 ft to the mat with RW and CGA. Pt worked on dynamic standing balance this session without UE support while standing on airex tossing horseshoes, x 2 trials with CGA. Pt worked on hip strength and balance to perform sidestepping in place with single UE support, theraband around LEs for increased resistance and cues for techniques, 2 x 10 with min assist. PT performed x 5 sit<>stands from edge of mat without UE support working on LE strength, cues for techniques and increased anterior weightshift. Pt worked on dynamic balance and stability while performing lateral weightshifting on rockerboard without UE support, x 2 trials with min assist. Pt ambulated back to room and left in recliner with LEs elevated, ice applied and needs in reach, chair alarm set.   Therapy Documentation Precautions:  Precautions Precautions: Fall Restrictions Weight Bearing Restrictions: No    Therapy/Group: Individual Therapy  Netta Corrigan, PT, DPT 04/16/2019, 1:52 PM

## 2019-04-16 NOTE — Progress Notes (Signed)
[facility] PHYSICAL MEDICINE & REHABILITATION PROGRESS NOTE   Subjective/Complaints:   Pt asking when foley can be removed- said he's done in/out cathing in past, and knows how, and expects will need it for awhile this time.  Also c/o L knee/LLE being weak and not coming back with strength like he expected- Also when walks, L knee hurts right over top.  Also c/o LE edema/swelling- B/L and equal- doesn't know why- explained the likely reason and explained will start TED hose to help.   Objective:   No results found. Recent Labs    04/16/19 0715  WBC 4.6  HGB 10.7*  HCT 34.8*  PLT 186   Recent Labs    04/16/19 0715  NA 140  K 3.5  CL 103  CO2 28  GLUCOSE 131*  BUN 11  CREATININE 0.79  CALCIUM 9.4    Intake/Output Summary (Last 24 hours) at 04/16/2019 1109 Last data filed at 04/16/2019 0900 Gross per 24 hour  Intake 560 ml  Output 975 ml  Net -415 ml     Physical Exam: Vital Signs Blood pressure (!) 148/85, pulse 60, temperature 98 F (36.7 C), resp. rate 15, SpO2 96 %. Awake, alert, appropriate, sitting up in chair next to bed, NAD RRR CTA B/L Soft, NT, ND LE 2+ pitting edema to just below knees B/L- most evident in feet Holding ice pack on L knee- has small bruise over patella on L Has foley- stat locked to RLE    Assessment/Plan: 1. Functional deficits secondary to debility from complex medical illness which require 3+ hours per day of interdisciplinary therapy in a comprehensive inpatient rehab setting.  Physiatrist is providing close team supervision and 24 hour management of active medical problems listed below.  Physiatrist and rehab team continue to assess barriers to discharge/monitor patient progress toward functional and medical goals  Care Tool:  Bathing    Body parts bathed by patient: Right arm, Left arm, Chest, Abdomen, Front perineal area, Buttocks, Right upper leg, Left upper leg, Right lower leg, Left lower leg, Face          Bathing assist Assist Level: Supervision/Verbal cueing     Upper Body Dressing/Undressing Upper body dressing   What is the patient wearing?: Pull over shirt    Upper body assist Assist Level: Set up assist    Lower Body Dressing/Undressing Lower body dressing      What is the patient wearing?: Underwear/pull up, Pants     Lower body assist Assist for lower body dressing: Minimal Assistance - Patient > 75%     Toileting Toileting    Toileting assist Assist for toileting: Minimal Assistance - Patient > 75%     Transfers Chair/bed transfer  Transfers assist     Chair/bed transfer assist level: Contact Guard/Touching assist     Locomotion Ambulation   Ambulation assist   Ambulation activity did not occur: Safety/medical concerns(required use of RW and was independent prior)  Assist level: Contact Guard/Touching assist Assistive device: Walker-rolling Max distance: 143ft   Walk 10 feet activity   Assist  Walk 10 feet activity did not occur: Safety/medical concerns  Assist level: Contact Guard/Touching assist Assistive device: Walker-rolling   Walk 50 feet activity   Assist Walk 50 feet with 2 turns activity did not occur: Safety/medical concerns  Assist level: Contact Guard/Touching assist Assistive device: Walker-rolling    Walk 150 feet activity   Assist Walk 150 feet activity did not occur: Safety/medical concerns  Assist level: Contact  Guard/Touching assist Assistive device: Walker-rolling    Walk 10 feet on uneven surface  activity   Assist Walk 10 feet on uneven surfaces activity did not occur: Safety/medical concerns(required use of RW and was independent prior)         Wheelchair     Assist Will patient use wheelchair at discharge?: No(TBD but anticipate pt will D/C at ambulatory level)             Wheelchair 50 feet with 2 turns activity    Assist            Wheelchair 150 feet activity      Assist          Blood pressure (!) 148/85, pulse 60, temperature 98 F (36.7 C), resp. rate 15, SpO2 96 %.  Medical Problem List and Plan: 1.   Debility secondary to Pseudomonas bacteremia and sepsis PT, OT CIR  2. Antithrombotics: -DVT/anticoagulation:Pharmaceutical:Other (comment)--Eliquis cont low dose due to hx hematuria on higher dose eliquis , has hematuria 2 d ago when foley tube was not well secured, voiding trial next wk  -antiplatelet therapy: N/A 3. Pain Management:N/A 4. Mood:LCSW to follow for evaluation and support. -antipsychotic agents: Seroquel. 5. Neuropsych: This patientiscapable of making decisions on hisown behalf. 6. Skin/Wound Care:Routine pressure relief measures. Add protein supplement to promote healing. 7. Fluids/Electrolytes/Nutrition:Monitor I/O. K+ wnl cont 61meq Kdur daily , d/c midline          8. A fib with RVR: Will check EKG as continues to go in and out of flutter. Continue amiodarone, metoprolol and cardizem.   Cardiology switch to cardizem CD 360mg  daily  Vitals:   04/15/19 1947 04/16/19 0511  BP: (!) 163/96 (!) 148/85  Pulse: 60 60  Resp: 15 15  Temp: 98 F (36.7 C) 98 F (36.7 C)  SpO2: 99% 96%  Blood pressure controlled, heart rate controlled 9/13 9. 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 Difficult cath due to BPH--needs coude cath. Continue flomax and will repeat trial in a few days. UCS negative--d/c rocephin Voiding trial next week  - will d/c foley and place order for PVRs/bladder scans q6 hours and cath if volumes >200cc.  10. T2DM: Monitor BS ac/hs. Use SSI for elevated BS and resume metformin if BS trend upwards.  CBG (last 3)  Recent Labs    04/15/19 1641 04/15/19 2052 04/16/19 0615  GLUCAP 99 118* 106*  Controlled, 9/14 11. Encephalopathy: Has resolved on Seroquel. Wean  12. Hypokalemia: K+ 3.1 on 9/7-->recheck labs  today.  - will replete slightly- is 3.5- just give 1 dose of 40 mEq; Mg also 1.5- put on daily. 13. Anemia of chronic illness:Likely affected byintermittent hematuria. Will follow H/H with serial checks. 14. LE edema  - will order TEDs B/L    LOS: 5 days A FACE TO FACE EVALUATION WAS PERFORMED  Andrew Compton 04/16/2019, 11:09 AM

## 2019-04-17 ENCOUNTER — Inpatient Hospital Stay (HOSPITAL_COMMUNITY): Payer: Medicare Other

## 2019-04-17 ENCOUNTER — Inpatient Hospital Stay (HOSPITAL_COMMUNITY): Payer: Medicare Other | Admitting: Occupational Therapy

## 2019-04-17 DIAGNOSIS — M25462 Effusion, left knee: Secondary | ICD-10-CM

## 2019-04-17 LAB — GLUCOSE, CAPILLARY
Glucose-Capillary: 101 mg/dL — ABNORMAL HIGH (ref 70–99)
Glucose-Capillary: 121 mg/dL — ABNORMAL HIGH (ref 70–99)
Glucose-Capillary: 113 mg/dL — ABNORMAL HIGH (ref 70–99)
Glucose-Capillary: 205 mg/dL — ABNORMAL HIGH (ref 70–99)

## 2019-04-17 MED ORDER — LIDOCAINE HCL 2 % IJ SOLN
5.0000 mL | Freq: Once | INTRAMUSCULAR | Status: AC
  Administered 2019-04-17: 100 mg
  Filled 2019-04-17 (×2): qty 10

## 2019-04-17 MED ORDER — BETAMETHASONE SOD PHOS & ACET 6 (3-3) MG/ML IJ SUSP
6.0000 mg | Freq: Once | INTRAMUSCULAR | Status: AC
  Administered 2019-04-17: 6 mg via INTRA_ARTICULAR
  Filled 2019-04-17: qty 1

## 2019-04-17 NOTE — Progress Notes (Signed)
Andrew Compton PHYSICAL MEDICINE & REHABILITATION PROGRESS NOTE   Subjective/Complaints: Still with Left knee pain, persists despite, ICE, Voltaren gel and ACE wrap   Pt asking about PEG removal was placed 8/10 in [place]   Doing well with bowel and bladder. Review of systems negative for chest pain shortness of breath nausea vomiting diarrhea   Objective:   No results found. Recent Labs    04/16/19 0715  WBC 4.6  HGB 10.7*  HCT 34.8*  PLT 186   Recent Labs    04/16/19 0715  NA 140  K 3.5  CL 103  CO2 28  GLUCOSE 131*  BUN 11  CREATININE 0.79  CALCIUM 9.4    Intake/Output Summary (Last 24 hours) at 04/17/2019 0923 Last data filed at 04/17/2019 0715 Gross per 24 hour  Intake 420 ml  Output 1200 ml  Net -780 ml     Physical Exam: Vital Signs Blood pressure (!) 148/96, pulse 60, temperature 98 F (36.7 C), temperature source Oral, resp. rate 15, height 6' (1.829 m), weight 93.9 kg, SpO2 99 %.   General: No acute distress Mood and affect are appropriate Heart: Regular rate and rhythm no rubs murmurs or extra sounds Lungs: Clear to auscultation, breathing unlabored, no rales or wheezes Abdomen: Positive bowel sounds, soft nontender to palpation, nondistended Extremities: No clubbing, cyanosis, or edema Skin: No evidence of breakdown, no evidence of rash Neurologic: Cranial nerves II through XII intact, motor strength is 5/5 in bilateral deltoid, bicep, tricep, grip, hip flexor, knee extensors, ankle dorsiflexor and plantar flexor Sensory exam normal sensation to light touch and proprioception in bilateral upper and lower extremities Cerebellar exam normal finger to nose to finger as well as heel to shin in bilateral upper and lower extremities Musculoskeletal: left knee effusion with suprapatellar tenderness    Assessment/Plan: 1. Functional deficits secondary to debility from complex medical illness which require 3+ hours per day of interdisciplinary therapy in a  comprehensive inpatient rehab setting.  Physiatrist is providing close team supervision and 24 hour management of active medical problems listed below.  Physiatrist and rehab team continue to assess barriers to discharge/monitor patient progress toward functional and medical goals  Care Tool:  Bathing    Body parts bathed by patient: Right arm, Left arm, Chest, Abdomen, Front perineal area, Buttocks, Right upper leg, Left upper leg, Right lower leg, Left lower leg, Face         Bathing assist Assist Level: Supervision/Verbal cueing     Upper Body Dressing/Undressing Upper body dressing   What is the patient wearing?: Pull over shirt    Upper body assist Assist Level: Minimal Assistance - Patient > 75%(in standing)    Lower Body Dressing/Undressing Lower body dressing      What is the patient wearing?: Underwear/pull up, Pants     Lower body assist Assist for lower body dressing: Minimal Assistance - Patient > 75%     Toileting Toileting    Toileting assist Assist for toileting: Minimal Assistance - Patient > 75%     Transfers Chair/bed transfer  Transfers assist     Chair/bed transfer assist level: Contact Guard/Touching assist     Locomotion Ambulation   Ambulation assist   Ambulation activity did not occur: Safety/medical concerns(required use of RW and was independent prior)  Assist level: Contact Guard/Touching assist Assistive device: Walker-rolling Max distance: 200 ft   Walk 10 feet activity   Assist  Walk 10 feet activity did not occur: Safety/medical concerns  Assist  level: Contact Guard/Touching assist Assistive device: Walker-rolling   Walk 50 feet activity   Assist Walk 50 feet with 2 turns activity did not occur: Safety/medical concerns  Assist level: Contact Guard/Touching assist Assistive device: Walker-rolling    Walk 150 feet activity   Assist Walk 150 feet activity did not occur: Safety/medical concerns  Assist  level: Contact Guard/Touching assist Assistive device: Walker-rolling    Walk 10 feet on uneven surface  activity   Assist Walk 10 feet on uneven surfaces activity did not occur: Safety/medical concerns(required use of RW and was independent prior)         Wheelchair     Assist Will patient use wheelchair at discharge?: No(TBD but anticipate pt will D/C at ambulatory level)             Wheelchair 50 feet with 2 turns activity    Assist            Wheelchair 150 feet activity     Assist          Blood pressure (!) 148/96, pulse 60, temperature 98 F (36.7 C), temperature source Oral, resp. rate 15, height 6' (1.829 m), weight 93.9 kg, SpO2 99 %.  Medical Problem List and Plan: 1.   Debility secondary to Pseudomonas bacteremia and sepsis PT, OT CIR  2. Antithrombotics: -DVT/anticoagulation:Pharmaceutical:Other (comment)--Eliquis cont low dose due to hx hematuria on higher dose eliquis , has hematuria 2 d ago when foley tube was not well secured, voiding trial next wk  -antiplatelet therapy: N/A 3. Pain Management:Left knee pain/effusion, persist despite conservative care, will order betamethasone and lidocaine  4. Mood:LCSW to follow for evaluation and support. -antipsychotic agents: Seroquel. 5. Neuropsych: This patientiscapable of making decisions on hisown behalf. 6. Skin/Wound Care:Routine pressure relief measures. Add protein supplement to promote healing. 7. Fluids/Electrolytes/Nutrition:Monitor I/O. K+ wnl cont 69meq Kdur daily , d/c midline          8. A fib with RVR: Will check EKG as continues to go in and out of flutter. Continue amiodarone, metoprolol and cardizem.   Cardiology switch to cardizem CD 360mg  daily  Vitals:   04/16/19 2007 04/17/19 0535  BP: (!) 150/92 (!) 148/96  Pulse: 89 60  Resp: 16 15  Temp: 98.3 F (36.8 C) 98 F (36.7 C)  SpO2: 98% 99%  Blood pressure controlled, heart rate  controlled 9/13 9. 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 Difficult cath due to BPH--needs coude cath. Continue flomax and will repeat trial in a few days. UCS negative--d/c rocephin Voiding trial next week 10. T2DM: Monitor BS ac/hs. Use SSI for elevated BS and resume metformin if BS trend upwards.  CBG (last 3)  Recent Labs    04/16/19 1714 04/16/19 2123 04/17/19 0607  GLUCAP 103* 124* 101*  Controlled, 9/13 11. Encephalopathy: Has resolved on Seroquel. Wean  12. Hypokalemia: K+ 3.1 on 9/7-->recheck labs today.  13. Anemia of chronic illness:Likely affected byintermittent hematuria. Will follow H/H with serial checks.    LOS: 6 days A FACE TO FACE EVALUATION WAS PERFORMED  Charlett Blake 04/17/2019, 9:23 AM

## 2019-04-17 NOTE — Progress Notes (Signed)
Occupational Therapy Session Note  Patient Details  Name: Andrew Compton MRN: [id number] Date of Birth: [birth year]  Today's Date: 04/17/2019 OT Individual Time: [id number] OT Individual Time Calculation (min): 54 min    Short Term Goals: Week 1:  OT Short Term Goal 1 (Week 1): Pt will complete toilet transfers using the RW and supervision. OT Short Term Goal 2 (Week 1): Pt will perform LB bathing and dressing sit to stand with supervision. OT Short Term Goal 3 (Week 1): Pt will complete toileting tasks sit to stand with supervision.  Skilled Therapeutic Interventions/Progress Updates:    Pt completed sit to stand from the bedside recliner with min assist secondary to left knee pain.  He exhibits occasional buckling of the LLE with the first step from the recliner, but this was not present during the rest of the session.  He was able to complete transfer to the wheelchair with min guard assist for transfer down to the therapy gym.  Once in the gym, he worked on [place]lin for 2 sets, one for 8 mins, and one for 6.5 mins.  Resistance was set on level 7 on Random Program.  He was able to maintain RPMs at 25 peddling forward and also peddling backwards for the second set.  Post vitals were 93-96% on room air with HR 100-105.  Next,, had pt complete simulated walk-in shower transfer with use of the RW and shower seat.  Discussed the need for a hand held shower and since pt has a 3:1 at home, this could be used for the shower.  He was able to complete the transfer in and out with min guard assist.  Finished session with transfer back to the room via wheelchair and pt completing stand pivot transfer back to the recliner.  Call button and phone in reach with safety alarm pad in place.    Therapy Documentation Precautions:  Precautions Precautions: Fall Restrictions Weight Bearing Restrictions: No  Pain: Pain Assessment Pain Scale: 0-10 Pain Score: 8  Pain  Type: Acute pain Pain Location: Knee Pain Orientation: Left Pain Descriptors / Indicators: Discomfort Pain Onset: With Activity Pain Intervention(s): Repositioned;Emotional support ADL: See Care Tool Section for some details of ADL  Therapy/Group: Individual Therapy  Anecia Nusbaum OTR/L 04/17/2019, 12:44 PM

## 2019-04-17 NOTE — Procedures (Signed)
Knee injection LEFT  Indication:LEFT Knee pain not relieved by medication management and other conservative care.  Informed consent was obtained after describing risks and benefits of the procedure with the patient, this includes bleeding, bruising, infection and medication side effects. The patient wishes to proceed and has given written consent. The patient was placed in a recumbent position. The medial aspect of the knee was marked and prepped with Betadine and alcohol. It was then entered with a 27-gauge 1/2 inch needle and 1 mL of 2% lidocaine was injected into the skin and subcutaneous tissue. Then another 21g 2"needle was inserted into the knee joint. After 4ml of blood tinged clear synovial fluid was withdrawn, a solution containing one ML of 6mg  per mL betamethasone and 2 mL of .5% marcaine were injected. The patient tolerated the procedure well. Post procedure instructions were given.

## 2019-04-17 NOTE — Progress Notes (Signed)
Physical Therapy Session Note  Patient Details  Name: Andrew Compton MRN: [id number] Date of Birth: 24-Sep-1950  Today's Date: 04/17/2019 PT Individual Time: [id number] and 1400-1510 PT Individual Time Calculation (min): 58 min and 70 min  Short Term Goals: Week 1:  PT Short Term Goal 1 (Week 1): Pt will perform sit<>stand transfers with CGA PT Short Term Goal 2 (Week 1): Pt will perform bed<>chair transfers using LRAD with CGA PT Short Term Goal 3 (Week 1): Pt will ambulate at least 145ft using LRAD with CGA PT Short Term Goal 4 (Week 1): Pt will ascend/descend 8 steps using handrails with CGA  Skilled Therapeutic Interventions/Progress Updates:    Session 1: Pt seated in w/c upon PT arrival, agreeable to therapy tx and denies pain at rest, L knee pain with weightbearing/gait. Therapist assisted pt to don teds for edema control, pt donned shoes with supervision. Therapist donned ace wrap for compression to L knee. Pt ambulated from w/c>toilet x 10 ft with RW and supervision, performed all toileting tasks without assist including pericare and clothing management. Pt ambulated from room>dayroom x 150 ft with RW and CGA, pt with antalgic gait secondary to knee pain. Pt used nustep this session x 10 minutes for global strengthening, endurance and ROM on workload 6 using B UEs/LEs. Pt ambulated x 80 ft towards other gym, pt had to stop and sit because of knee pain. Therapist had to get w/c and transport pt the rest of the way secondary to L knee pain, unable to tolerated further gait training this session. Pt worked on dynamic standing balance to perform ball toss against rebounder trampoline, x 1 standing on level surface and x 1 standing on airex, CGA-min assist. Pt performed standing therex for strengthening, 2 x 10 each with cues for techniques and eccentric control- mini squats without UE support and standing hip abduction with single UE support. Pt transported back to the room and ambulated x 10  ft to the recliner, left in chair with needs in reach and LEs elevated, chair alarm set.   Session 2: Pt seated in recliner upon PT arrival, agreeable to therapy tx and denies pain at rest, 7/10 pain in L knee during gait. Pt ambulated x 15 ft with RW and supervision and then transported the rest of the way to the gym. Pt performed stand pivot to the mat with RW and supervision. Pt worked on standing balance and heel cord stretching while standing on red wedge to complete peg board puzzle without UE support, CGA. Pt performed seated therex this session for LE strengthening including 2 x 10 of each with cues for techniques- LAQ with 3# ankle weights, seated marches 3# ankle weights, and hip abduction with green theraband. Pt performed sit<>stands for LE strengthening without UE support, 2 x 5 with min assist and cues for techniques/foot placement.  Pt worked on Dietitian and LE strength to perform 2 x 10 mini squats while standing on airex without UE support, min guard assist. Pt worked on standing balance without UE support while on airex romberg eyes open and romberg eyes closed x2 trials each with CGA assist. Pt transferred to supine with supervision and performed 2 x 10 bridges and 2 x 10 SLR for strengthening. Transferred to sitting with supervision and transferred to w/c with RW and CGA. Pt transported back to the room and ambulated x 10 ft to the recliner, left in chair with needs in reach and LEs elevated, chair alarm set and ice applied  for pain relief.    Therapy Documentation Precautions:  Precautions Precautions: Fall Restrictions Weight Bearing Restrictions: No    Therapy/Group: Individual Therapy  Netta Corrigan, PT, DPT 04/17/2019, 9:14 AM

## 2019-04-18 ENCOUNTER — Inpatient Hospital Stay (HOSPITAL_COMMUNITY): Payer: Medicare Other | Admitting: Occupational Therapy

## 2019-04-18 ENCOUNTER — Inpatient Hospital Stay (HOSPITAL_COMMUNITY): Payer: Medicare Other

## 2019-04-18 ENCOUNTER — Inpatient Hospital Stay (HOSPITAL_COMMUNITY): Payer: Medicare Other | Admitting: Physical Therapy

## 2019-04-18 ENCOUNTER — Telehealth: Payer: Self-pay | Admitting: Cardiology

## 2019-04-18 LAB — BASIC METABOLIC PANEL
Anion gap: 11 (ref 5–15)
BUN: 15 mg/dL (ref 8–23)
CO2: 25 mmol/L (ref 22–32)
Calcium: 9.8 mg/dL (ref 8.9–10.3)
Chloride: 100 mmol/L (ref 98–111)
Creatinine, Ser: 0.81 mg/dL (ref 0.61–1.24)
GFR calc Af Amer: >60 mL/min (ref >60)
GFR calc non Af Amer: >60 mL/min (ref >60)
Glucose, Bld: 127 mg/dL — ABNORMAL HIGH (ref 70–99)
Potassium: 3.8 mmol/L (ref 3.5–5.1)
Sodium: 136 mmol/L (ref 135–145)

## 2019-04-18 LAB — GLUCOSE, CAPILLARY
Glucose-Capillary: 137 mg/dL — ABNORMAL HIGH (ref 70–99)
Glucose-Capillary: 98 mg/dL (ref 70–99)
Glucose-Capillary: 128 mg/dL — ABNORMAL HIGH (ref 70–99)
Glucose-Capillary: 140 mg/dL — ABNORMAL HIGH (ref 70–99)

## 2019-04-18 MED ORDER — METOPROLOL TARTRATE 25 MG PO TABS
25.0000 mg | ORAL_TABLET | Freq: Two times a day (BID) | ORAL | Status: DC
  Administered 2019-04-18 – 2019-04-26 (×16): 25 mg via ORAL
  Filled 2019-04-18 (×17): qty 1

## 2019-04-18 MED ORDER — DILTIAZEM HCL ER COATED BEADS 180 MG PO CP24
360.0000 mg | ORAL_CAPSULE | Freq: Every day | ORAL | Status: DC
  Administered 2019-04-19 – 2019-04-26 (×8): 360 mg via ORAL
  Filled 2019-04-18 (×8): qty 2

## 2019-04-18 NOTE — Progress Notes (Signed)
OT called nurse to room, pulse ox reporting resting HR of 140s, informed that have not gvn am meds yet and pt usually has before going to therapy. Informed PA, Apical HR 122 at rest, nonsymptomatic, no chest pain or SOB. Will monitor pt, see new orders. Administered AM medications.

## 2019-04-18 NOTE — Patient Care Conference (Signed)
Inpatient RehabilitationTeam Conference and Plan of Care Update Date: 04/18/2019   Time: 11:15 AM    Patient Name: Andrew Compton      Medical Record Number: [id number]  Date of Birth: 1951/07/10 Sex: Male         Room/Bed: 4W22C/4W22C-01 Payor Info: Payor: MEDICARE / Plan: MEDICARE PART A AND B / Product Type: *No Product type* /    Admit Date/Time:  04/11/2019  3:36 PM  Primary Diagnosis:  <principal problem not specified>  Patient Active Problem List   Diagnosis Date Noted  . Physical debility 04/11/2019  . Acute on chronic respiratory failure with hypoxia ([place])   . Paroxysmal atrial fibrillation ([facility])   . Pneumonia due to Pseudomonas aeruginosa ([place])   . Chronic atrial fibrillation   . Acute pancreatitis with uninfected necrosis, unspecified   . Metabolic encephalopathy   . Severe sepsis ([place])   . Bruit 02/24/2015  . Chronic diastolic heart failure- Grade 2 05/03/2013  . PAF (paroxysmal atrial fibrillation) ([place]) 05/01/2013  . HTN (hypertension) 05/01/2013  . Gout 05/01/2013  . 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 Upper extremity weakness 04/06/2013  . GERD (gastroesophageal reflux disease) 01/17/2013  . Barrett's esophagus 01/25/2012  . Dysphagia 01/25/2012  . Mitral regurgitation 09/27/2011  . Paroxysmal atrial fibrillation ([facility])   . Left ventricular hypertrophy   . Essential hypertension, benign     Expected Discharge Date: Expected Discharge Date: 04/26/19  Team Members Present: Physician leading conference: Dr. Alysia Penna Social Worker Present: Ovidio Kin, LCSW Nurse Present: Isla Pence, RN PT Present: Michaelene Song, PT OT Present: Clyda Greener, OT SLP Present: Stormy Fabian, SLP PPS Coordinator present : Gunnar Fusi, SLP     Current Status/Progress Goal Weekly Team Focus  Bowel/Bladder   continent of bowel & bladder, LBM 9/15, foley wa removed on 9/14  remain continent, continue to empty bladder without caths  continue PVRs    Swallow/Nutrition/ Hydration             ADL's   Supervision for UB selfcare, supervision to min guard assist sit to stand,  min for functional transfers  modified independent level overall  selfcare retraining, balance retraining, endurance building, DME education   Mobility   supervision-min assist for transfers and gait up to 200 ft with RW, limited by L knee pain. min-mod assist for steps secondary to L knee pain/buckling  supervisino-mod I  pain management, LE strength, endurance, gait, balance   Communication             Safety/Cognition/ Behavioral Observations            Pain   no c/o pain on shift, had steroid injection today into the left knee, has tylenol prn & voltaren gel scheduled  pain scale <4/10  assess & treat as needed   Skin   has a blister to the left posterior leg, edema to left knee  no new skin issues, les inflammation to left knee  assess q shift      *See Care Plan and progress notes for long and short-term goals.     Barriers to Discharge  Current Status/Progress Possible Resolutions Date Resolved   Nursing                  PT                    OT  SLP                SW                Discharge Planning/Teaching Needs:  HOme with wife who is able to assist, pt is making goood progress in his therapies, hopeful with his knee being injected yersterday it will help.      Team Discussion:  Progressing toward his goals of supervision-mod/i level. MD injected knee yesterday and already better today. PEG can be removed 9/24. I & O cath last night according to nursing did not try to void prior to this. EKG today per nurse. Wife to be in for teaching next week.  Revisions to Treatment Plan:  DC 9/24    Medical Summary Current Status: Left knee effusion, improved with injection, foley removed, mainly able to void Weekly Focus/Goal: possible PEG removal  Barriers to Discharge: Medical stability;Other (comments)  Barriers to  Discharge Comments: knee pain  interferes with amb Possible Resolutions to Barriers: Corticosteroid injection Left knee,  tachycardia with therapy   Continued Need for Acute Rehabilitation Level of Care: The patient requires daily medical management by a physician with specialized training in physical medicine and rehabilitation for the following reasons: Direction of a multidisciplinary physical rehabilitation program to maximize functional independence : Yes Medical management of patient stability for increased activity during participation in an intensive rehabilitation regime.: Yes Analysis of laboratory values and/or radiology reports with any subsequent need for medication adjustment and/or medical intervention. : Yes   I attest that I was present, lead the team conference, and concur with the assessment and plan of the team. Teleconference held due to COVID 19   Areeba Sulser, Gardiner Rhyme 04/18/2019, 1:58 PM

## 2019-04-18 NOTE — Telephone Encounter (Signed)
Called patient to schedule followup, but, he is still in the hospital in rehab.  I told him that I would call him the end of next week to schedule the appointment.

## 2019-04-18 NOTE — Progress Notes (Signed)
Physical Therapy Session Note  Patient Details  Name: Andrew Compton MRN: [id number] Date of Birth: Feb 12, 1951  Today's Date: 04/18/2019 PT Individual Time: 1300-1410 PT Individual Time Calculation (min): 70 min   Short Term Goals: Week 1:  PT Short Term Goal 1 (Week 1): Pt will perform sit<>stand transfers with CGA PT Short Term Goal 2 (Week 1): Pt will perform bed<>chair transfers using LRAD with CGA PT Short Term Goal 3 (Week 1): Pt will ambulate at least 17ft using LRAD with CGA PT Short Term Goal 4 (Week 1): Pt will ascend/descend 8 steps using handrails with CGA  Skilled Therapeutic Interventions/Progress Updates:    Pt seated in recliner upon PT arrival, agreeable to therapy tx and denies pain at rest, reports improved pain during wieghtbearing/ambulation this session 3/10 in L knee. Pt ambulated to the dayroom x 120 ft with RW and supervision. Pt used nustep this session using B LEs only on workload 7 for strength/endurance x 5 minutes. Pt ambulated to the gym x 160 ft with RW and supervision. Pt ascended/descended 8 steps this session with single rail and step to pattern, cues for techniques. Pt ambulated back to the mat x 50 ft with RW and supervision. Pt worked on dynamic standing balance this session without UE support to perform the following activties, CGA-min assist for each: forward toe taps on aerobic step, lateral toe taps on aerobic step, sidestepping in each direction 3 x 10 ft, forwards/backwards ambulation 3 x 10 ft, and toe taps in all directions to colored dots. Pt worked on LE strength to perform 2 x 10 squats without UE support, cues for techniques. Pt ambulated from gym<>rehab apart 2 x 80 ft with RW and supervision, pt performed furniture transfers this session including couch and recliner with CGA for sit<>stand from low surface and cues for hand placement. Pt ambulated back to room with RW and supervision, left in w/c with needs in reach and chair alarm set.    Therapy Documentation Precautions:  Precautions Precautions: Fall Restrictions Weight Bearing Restrictions: No    Therapy/Group: Individual Therapy  Netta Corrigan, PT, DPT 04/18/2019, 1:29 PM

## 2019-04-18 NOTE — Progress Notes (Addendum)
Informed that patient's HR in 140's with OT. On exam, patient brushing teeth at sink. Just had shower "hot as it can get" and OT reports heart rate 120-140's on pulse ox. No CP, SOB or palpitations reported.  On exam, HR ranging in 120's apically. He has not had his metoprolol or Cardizem yet--will schedule them at 6 am so that it's administered prior to activity. Potassium low normal--will recheck BMET. Will check EKG around noon after medications.   He was cathed once early am for 900 cc but has been voiding without difficulty. He reports that he tried to pee in bed and couldn't urinate lying down. Wrote orders to get patient to EOB, BSC or stand to help voiding prior to cath. Also advised patient to advocate for himself and importance of voiding prior to going to bed. Marland Kitchen

## 2019-04-18 NOTE — Progress Notes (Signed)
Social Work Patient ID: Andrew Compton, male   DOB: September 10, 1950, 68 y.o.   MRN: [id number]  Met with pt to discuss team conference goals of mod/i level and discharge date 9/24. He will be able to get his PEG removed on this date according to MD. Discussed follow up [facility] and he has a preference of [facility] since he has been there before. Will work on his discharge needs.

## 2019-04-18 NOTE — Progress Notes (Signed)
Physical Therapy Session Note  Patient Details  Name: Andrew Compton MRN: [id number] Date of Birth: 07/28/1951  Today's Date: 04/18/2019 PT Individual Time: 1105-1200 PT Individual Time Calculation (min): 55 min   Short Term Goals: Week 1:  PT Short Term Goal 1 (Week 1): Pt will perform sit<>stand transfers with CGA PT Short Term Goal 2 (Week 1): Pt will perform bed<>chair transfers using LRAD with CGA PT Short Term Goal 3 (Week 1): Pt will ambulate at least 184ft using LRAD with CGA PT Short Term Goal 4 (Week 1): Pt will ascend/descend 8 steps using handrails with CGA  Skilled Therapeutic Interventions/Progress Updates:    Pt received seated in w/c in room, agreeable to PT session. Pt performs sit to stand with CGA to RW throughout session. Pt reports improvement in L knee pain since injection yesterday, no complaints of pain during session. Ambulation 2 x 150 ft with RW and close SBA to CGA. Ambulation through obstacle course with RW navigating through cones with focus on safe RW management and LE placement. Sidesteps L/R 2 x 10 ft with RW and close SBA. Forward/backward ambulation with RW and CGA with increased assist needed for backwards ambulation due to decreased balance and some posterior lean. Standing mini-squats 2 x 10 reps with no UE support and close SBA for BLE strengthening. Alt L/R cone taps with RW and close SBA with focus on lateral weight shift, WBing through single LE, and LE coordination. Pt progresses from one cone to three cones. Pt left seated in w/c in room with needs in reach, chair alarm in place at end of session.  Therapy Documentation Precautions:  Precautions Precautions: Fall Restrictions Weight Bearing Restrictions: No    Therapy/Group: Individual Therapy   Excell Seltzer, PT, DPT  04/18/2019, 12:51 PM

## 2019-04-18 NOTE — Progress Notes (Signed)
Occupational Therapy Session Note  Patient Details  Name: Andrew Compton MRN: [id number] Date of Birth: 1950-08-23  Today's Date: 04/18/2019 OT Individual Time: 0800-0901 OT Individual Time Calculation (min): 61 min    Short Term Goals: Week 1:  OT Short Term Goal 1 (Week 1): Pt will complete toilet transfers using the RW and supervision. OT Short Term Goal 2 (Week 1): Pt will perform LB bathing and dressing sit to stand with supervision. OT Short Term Goal 3 (Week 1): Pt will complete toileting tasks sit to stand with supervision. Week 2:     Skilled Therapeutic Interventions/Progress Updates:    Pt worked on bathing and dressing sit to stand during session.  Supervision for sit to stand from the EOB and for ambulation into the shower.  Pt reporting less pain with weightbearing and mobility.  He was able to complete all bathing in sitting with supervision.  He was able to complete most dressing from the shower seat with supervision and then transfer out to the wheelchair with supervision for grooming tasks.  O2 sats and HR checked after transfer with sats at 96% but HR increased to 146.  Noted HR then maintained above 140 for greater than 8-10 mins with nursing being notified as well as the PA.  Pt with no symptoms with the increased HR.  Therapist assisted with donning TEDs and then he was able to complete donning and tying shoes with setup assist.  He was left with nursing and PA at end of session to take meds after brushing his hair and his teeth with setup of supplies.    Therapy Documentation Precautions:  Precautions Precautions: Fall Restrictions Weight Bearing Restrictions: No  Pain: Pain Assessment Pain Scale: Faces Faces Pain Scale: Hurts a little bit Pain Type: Acute pain Pain Location: Knee Pain Orientation: Left Pain Descriptors / Indicators: Discomfort Pain Onset: With Activity Pain Intervention(s): Repositioned ADL: See Care Tool for some details of  ADLs  Therapy/Group: Individual Therapy  Jerrald Doverspike OTR/L 04/18/2019, 11:08 AM

## 2019-04-19 ENCOUNTER — Inpatient Hospital Stay (HOSPITAL_COMMUNITY): Payer: Medicare Other | Admitting: Occupational Therapy

## 2019-04-19 ENCOUNTER — Inpatient Hospital Stay (HOSPITAL_COMMUNITY): Payer: Medicare Other | Admitting: Physical Therapy

## 2019-04-19 LAB — GLUCOSE, CAPILLARY
Glucose-Capillary: 102 mg/dL — ABNORMAL HIGH (ref 70–99)
Glucose-Capillary: 112 mg/dL — ABNORMAL HIGH (ref 70–99)
Glucose-Capillary: 117 mg/dL — ABNORMAL HIGH (ref 70–99)
Glucose-Capillary: 132 mg/dL — ABNORMAL HIGH (ref 70–99)

## 2019-04-19 NOTE — Progress Notes (Signed)
Occupational Therapy Session Note  Patient Details  Name: Andrew Compton MRN: [id number] Date of Birth: November 09, 1950  Today's Date: 04/19/2019 OT Individual Time: [id number] OT Individual Time Calculation (min): 62 min    Short Term Goals: Week 1:  OT Short Term Goal 1 (Week 1): Pt will complete toilet transfers using the RW and supervision. OT Short Term Goal 2 (Week 1): Pt will perform LB bathing and dressing sit to stand with supervision. OT Short Term Goal 3 (Week 1): Pt will complete toileting tasks sit to stand with supervision.  Skilled Therapeutic Interventions/Progress Updates:    patient in bed, alert and ready for therapy session.  Bed mobility with CS, SPT with RW to/from bed, w/c, shower seat with back CS,min cues.  Able to stand at sink to complete oral care and wash face with CS.  Ambulated to/from therapy gym with RW CS.  Reviewed DME and transfer options for shower stall.  Reviewed and practiced HM techniques and reach/transport of objects in the home environment - he demonstrates good understanding and carryover of strategies.  He ambulated back to his room with RW - remained seated in w/c with items within reach.    Therapy Documentation Precautions:  Precautions Precautions: Fall Restrictions Weight Bearing Restrictions: No General:   Vital Signs:   Pain: Pain Assessment Pain Scale: 0-10 Pain Score: 2  Pain Location: Knee Pain Orientation: Left Pain Descriptors / Indicators: Tender   Other Treatments:     Therapy/Group: Individual Therapy  Carlos Levering 04/19/2019, 12:03 PM

## 2019-04-19 NOTE — Progress Notes (Signed)
Physical Therapy Weekly Progress Note  Patient Details  Name: Andrew Compton MRN: [id number] Date of Birth: February 09, 1951  Beginning of progress report period: April 12, 2019 End of progress report period: April 19, 2019  Today's Date: 04/19/2019 PT Individual Time: 9735-3299 PT Individual Time Calculation (min): 71 min   Patient has met 4 of 4 short term goals. Patient progressing well with therapy from requiring mod assist for sit<>stand transfers and min /mod assist for gait at evaluation to now performing transfers with close supervision and gait up to 174f using RW with close supervision. Patient continues to demonstrates significant B LE strength impairments as well as decreased activity tolerance/endurance affecting pt's standing balance.   Patient continues to demonstrate the following deficits muscle weakness, decreased cardiorespiratoy endurance, unbalanced muscle activation and decreased standing balance and decreased balance strategies and therefore will continue to benefit from skilled PT intervention to increase functional independence with mobility.  Patient progressing toward long term goals.  Continue plan of care.  PT Short Term Goals Week 1:  PT Short Term Goal 1 (Week 1): Pt will perform sit<>stand transfers with CGA PT Short Term Goal 1 - Progress (Week 1): Met PT Short Term Goal 2 (Week 1): Pt will perform bed<>chair transfers using LRAD with CGA PT Short Term Goal 2 - Progress (Week 1): Met PT Short Term Goal 3 (Week 1): Pt will ambulate at least 1035fusing LRAD with CGA PT Short Term Goal 3 - Progress (Week 1): Met PT Short Term Goal 4 (Week 1): Pt will ascend/descend 8 steps using handrails with CGA PT Short Term Goal 4 - Progress (Week 1): Met Week 2:  PT Short Term Goal 1 (Week 2): = to LTGs based on ELOS  Skilled Therapeutic Interventions/Progress Updates:  Ambulation/gait training;Community reintegration;DME/adaptive equipment  instruction;Neuromuscular re-education;Psychosocial support;Stair training;UE/LE Strength taining/ROM;Balance/vestibular training;Discharge planning;Functional electrical stimulation;Pain management;Skin care/wound management;Therapeutic Activities;UE/LE Coordination activities;Cognitive remediation/compensation;Disease management/prevention;Functional mobility training;Patient/family education;Splinting/orthotics;Therapeutic Exercise;Visual/perceptual remediation/compensation   Pt received sitting in recliner and agreeable to therapy session. Sit>stand recliner>RW with close supervision for safety. Ambulated ~[id number]>therapy gym using RW with close supervision for safety - continues to demonstrate decreased gait speed and decreased B LE foot clearance. Repeated sit<>stands EOM<>no UE support from lower mat with min assist for lifting/balance and cuing for increased anterior trunk lean followed by increased hip extension - 2 sets of 10 reps. Alternate B LE foot taps on 6" step progressed to with external target, all without UE support, with min assist for balance - demonstrates increased unsteadiness during L LE single limb stance. Progressed to stepping up/down 6" step, with B UE support on RW, stepping up with either LE but always stepping down and back with L LE - min assist for balance and lifting/lowering especially when stepping up with L LE compared to R LE. Standing B UE support on RW heel raises x10 with pt demonstrating heavy compensation via pushing down through BUEs into RW - transitioned to seated ankle PF against level 1 theraband resistance 2x20 reps each LE with pt demonstrating significant L LE ankle DF weakness - educated pt on performing this exercise 3x daily 2sets 20 reps outside of therapy sessions - pt verbalized understanding. Ambulated 64f8fth L HHA, and min assist for balance - cuing for increased L hip/knee extension during stance phase progressed to 133ft71f no UE support, with  min assist for balance and 1x mod assist due to minor R LOB during 1st walk - focusing on pt's dynamic standing balance. Ambulated ~  155f using RW back to his room with close supervision. Andrew requesting pt return to supine for bladder scan. Pt left sitting EOB with Andrew present to assume care of patient.  Therapy Documentation Precautions:  Precautions Precautions: Fall Restrictions Weight Bearing Restrictions: No  Pain: Reports pain level of 4-5/10 in L knee - wearing L knee brace and provided seated rest breaks as needed throughout therapy session but pt reports his knee pain is significantly improved since the corticosteroid injection.  Therapy/Group: Individual Therapy  CTawana Scale PT, DPT 04/19/2019, 1:04 PM

## 2019-04-19 NOTE — Plan of Care (Signed)
  Problem: RH Ambulation Goal: LTG Patient will ambulate in home environment (PT) Description: LTG: Patient will ambulate in home environment, # of feet with assistance (PT). Flowsheets (Taken 04/19/2019 1745) LTG: Pt will ambulate in home environ  assist needed:: (upgraded based on pt's progress) Independent with assistive device LTG: Ambulation distance in home environment: 62ft using LRAD Note: upgraded based on pt's progress

## 2019-04-19 NOTE — Progress Notes (Signed)
[facility] PHYSICAL MEDICINE & REHABILITATION PROGRESS NOTE   Subjective/Complaints: Knee pain improved after corticosteroid injection. Discussed PEG removal prior to discharge from hospital  Doing well with bowel and bladder. Review of systems negative for chest pain shortness of breath nausea vomiting diarrhea   Objective:   No results found. No results for input(s): WBC, HGB, HCT, PLT in the last 72 hours. Recent Labs    04/18/19 0948  NA 136  K 3.8  CL 100  CO2 25  GLUCOSE 127*  BUN 15  CREATININE 0.81  CALCIUM 9.8    Intake/Output Summary (Last 24 hours) at 04/19/2019 1415 Last data filed at 04/19/2019 1255 Gross per 24 hour  Intake 680 ml  Output 1845 ml  Net -1165 ml     Physical Exam: Vital Signs Blood pressure 137/90, pulse 76, temperature 97.8 F (36.6 C), resp. rate 19, height 6' (1.829 m), weight 94 kg, SpO2 98 %.   General: No acute distress Mood and affect are appropriate Heart: Regular rate and rhythm no rubs murmurs or extra sounds Lungs: Clear to auscultation, breathing unlabored, no rales or wheezes Abdomen: Positive bowel sounds, soft nontender to palpation, nondistended Extremities: No clubbing, cyanosis, or edema Skin: No evidence of breakdown, no evidence of rash Neurologic: Cranial nerves II through XII intact, motor strength is 5/5 in bilateral deltoid, bicep, tricep, grip, hip flexor, knee extensors, ankle dorsiflexor and plantar flexor Sensory exam normal sensation to light touch and proprioception in bilateral upper and lower extremities Cerebellar exam normal finger to nose to finger as well as heel to shin in bilateral upper and lower extremities Musculoskeletal: left knee effusion with suprapatellar tenderness    Assessment/Plan: 1. Functional deficits secondary to debility from complex medical illness which require 3+ hours per day of interdisciplinary therapy in a comprehensive inpatient rehab setting.  Physiatrist is providing  close team supervision and 24 hour management of active medical problems listed below.  Physiatrist and rehab team continue to assess barriers to discharge/monitor patient progress toward functional and medical goals  Care Tool:  Bathing    Body parts bathed by patient: Right arm, Left arm, Chest, Abdomen, Front perineal area, Buttocks, Right upper leg, Left upper leg, Right lower leg, Left lower leg, Face         Bathing assist Assist Level: Supervision/Verbal cueing     Upper Body Dressing/Undressing Upper body dressing   What is the patient wearing?: Pull over shirt    Upper body assist Assist Level: Minimal Assistance - Patient > 75%    Lower Body Dressing/Undressing Lower body dressing      What is the patient wearing?: Underwear/pull up, Pants     Lower body assist Assist for lower body dressing: Minimal Assistance - Patient > 75%     Toileting Toileting    Toileting assist Assist for toileting: Minimal Assistance - Patient > 75%     Transfers Chair/bed transfer  Transfers assist     Chair/bed transfer assist level: Contact Guard/Touching assist     Locomotion Ambulation   Ambulation assist   Ambulation activity did not occur: Safety/medical concerns(required use of RW and was independent prior)  Assist level: Supervision/Verbal cueing Assistive device: Walker-rolling Max distance: 150'   Walk 10 feet activity   Assist  Walk 10 feet activity did not occur: Safety/medical concerns  Assist level: Contact Guard/Touching assist Assistive device: Walker-rolling   Walk 50 feet activity   Assist Walk 50 feet with 2 turns activity did not occur: Safety/medical  concerns  Assist level: Contact Guard/Touching assist Assistive device: Walker-rolling    Walk 150 feet activity   Assist Walk 150 feet activity did not occur: Safety/medical concerns  Assist level: Contact Guard/Touching assist Assistive device: Walker-rolling    Walk 10 feet  on uneven surface  activity   Assist Walk 10 feet on uneven surfaces activity did not occur: Safety/medical concerns(required use of RW and was independent prior)         Wheelchair     Assist Will patient use wheelchair at discharge?: No(TBD but anticipate pt will D/C at ambulatory level)             Wheelchair 50 feet with 2 turns activity    Assist            Wheelchair 150 feet activity     Assist          Blood pressure 137/90, pulse 76, temperature 97.8 F (36.6 C), resp. rate 19, height 6' (1.829 m), weight 94 kg, SpO2 98 %.  Medical Problem List and Plan: 1.   Debility secondary to Pseudomonas bacteremia and sepsis PT, OT CIR  2. Antithrombotics: -DVT/anticoagulation:Pharmaceutical:Other (comment)--Eliquis cont low dose due to hx hematuria on higher dose eliquis ,-antiplatelet therapy: N/A 3. Pain Management:Left knee pain/effusion, persist despite conservative care, will order betamethasone and lidocaine  4. Mood:LCSW to follow for evaluation and support. -antipsychotic agents: Seroquel. 5. Neuropsych: This patientiscapable of making decisions on hisown behalf. 6. Skin/Wound Care:Routine pressure relief measures. Add protein supplement to promote healing. 7. Fluids/Electrolytes/Nutrition:Monitor I/O. K+ wnl cont 46meq Kdur daily , d/c PEG on day of discharge        8. A fib with RVR: Will check EKG as continues to go in and out of flutter. Continue amiodarone, metoprolol and cardizem.   Cardiology switch to cardizem CD 360mg  daily  Vitals:   04/18/19 1949 04/19/19 0605  BP: 133/85 137/90  Pulse: 70 76  Resp: 18 19  Temp: 97.9 F (36.6 C) 97.8 F (36.6 C)  SpO2: 98% 98%  Blood pressure controlled, heart rate controlled 9/17 9. 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 Difficult cath due to BPH--needs coude cath. Continue flomax and will repeat trial  in a few days. UCS negative--d/c rocephin Voiding trial going well, no need for intermittent cath program 10. T2DM: Monitor BS ac/hs. Use SSI for elevated BS and resume metformin if BS trend upwards.  CBG (last 3)  Recent Labs    04/18/19 2121 04/19/19 0627 04/19/19 1140  GLUCAP 140* 102* 112*  Controlled, 9/17 11. Encephalopathy: Has resolved on Seroquel. Wean  12. Hypokalemia: K+ 3.1 on 9/7-->recheck labs today.  13. Anemia of chronic illness:Likely affected byintermittent hematuria. Will follow H/H with serial checks.    LOS: 8 days A FACE TO FACE EVALUATION WAS PERFORMED  Charlett Blake 04/19/2019, 2:15 PM

## 2019-04-19 NOTE — Progress Notes (Signed)
Occupational Therapy Session Note  Patient Details  Name: TAEQUAN DELIC MRN: [id number] Date of Birth: [birth year]  Today's Date: 04/19/2019 OT Individual Time: 1001-1102 OT Individual Time Calculation (min): 61 min    Short Term Goals: Week 1:  OT Short Term Goal 1 (Week 1): Pt will complete toilet transfers using the RW and supervision. OT Short Term Goal 2 (Week 1): Pt will perform LB bathing and dressing sit to stand with supervision. OT Short Term Goal 3 (Week 1): Pt will complete toileting tasks sit to stand with supervision.  Skilled Therapeutic Interventions/Progress Updates:    Pt completed functional mobility down to the therapy gym with supervision using the RW.  Once in the gym focused on dynamic standing balance having pt stand on solid surface as well as foam surface while completing head movements and functional reaching.  No LOB when standing on solid ground and reaching various directions.  He needed min assist for stepping on and off of the foam surface with supervision once he was standing on it while completing head movements and standing with eyes closed.  Increased swaying noted with eyes closed however.  Finished session with ambulation back to the room with supervision using the RW for support.  He was left sitting in the recliner with call button and phone in reach and safety alarm pad in place.    Therapy Documentation Precautions:  Precautions Precautions: Fall Restrictions Weight Bearing Restrictions: No   Pain: Pain Assessment Pain Scale: 0-10 Pain Score: 2  Pain Type: Acute pain Pain Location: Knee Pain Orientation: Left Pain Descriptors / Indicators: Tender Pain Onset: With Activity ADL: See Care Tool Section for some details of ADL  Therapy/Group: Individual Therapy  Taelon Bendorf OTR/L 04/19/2019, 12:28 PM

## 2019-04-20 ENCOUNTER — Inpatient Hospital Stay (HOSPITAL_COMMUNITY): Payer: Medicare Other | Admitting: Physical Therapy

## 2019-04-20 ENCOUNTER — Inpatient Hospital Stay (HOSPITAL_COMMUNITY): Payer: Medicare Other | Admitting: Occupational Therapy

## 2019-04-20 LAB — GLUCOSE, CAPILLARY
Glucose-Capillary: 105 mg/dL — ABNORMAL HIGH (ref 70–99)
Glucose-Capillary: 125 mg/dL — ABNORMAL HIGH (ref 70–99)
Glucose-Capillary: 105 mg/dL — ABNORMAL HIGH (ref 70–99)
Glucose-Capillary: 132 mg/dL — ABNORMAL HIGH (ref 70–99)

## 2019-04-20 NOTE — Plan of Care (Signed)
  Problem: Consults Goal: Diabetes Guidelines if Diabetic/Glucose > 140 Description: If diabetic or lab glucose is > 140 mg/dl - Initiate Diabetes/Hyperglycemia Guidelines & Document Interventions  Outcome: Progressing   Problem: RH BLADDER ELIMINATION Goal: RH STG MANAGE BLADDER WITH EQUIPMENT WITH ASSISTANCE Description: STG Manage Bladder With Equipment With min Assistance Outcome: Progressing   Problem: RH SAFETY Goal: RH STG ADHERE TO SAFETY PRECAUTIONS W/ASSISTANCE/DEVICE Description: STG Adhere to Safety Precautions With  [employer] Device. Outcome: Progressing   Problem: RH PAIN MANAGEMENT Goal: RH STG PAIN MANAGED AT OR BELOW PT'S PAIN GOAL Description: 0-3 out of 10 Outcome: Not Progressing

## 2019-04-20 NOTE — Plan of Care (Signed)
  Problem: RH Balance Goal: LTG Patient will maintain dynamic standing with ADLs (OT) Description: LTG:  Patient will maintain dynamic standing balance with assist during activities of daily living (OT)  Flowsheets (Taken 04/20/2019 1253) LTG: Pt will maintain dynamic standing balance during ADLs with: (goal upgraded based on progress) Independent with assistive device   Problem: RH Simple Meal Prep Goal: LTG Patient will perform simple meal prep w/assist (OT) Description: LTG: Patient will perform simple meal prep with assistance, with/without cues (OT). Flowsheets (Taken 04/20/2019 1253) LTG: Pt will perform simple meal prep with assistance level of: (Goal upgraded based on progress) Independent with assistive device

## 2019-04-20 NOTE — Progress Notes (Signed)
Physical Therapy Session Note  Patient Details  Name: Andrew Compton MRN: [id number] Date of Birth: 07/25/1951  Today's Date: 04/20/2019 PT Individual Time: 0806-0900 PT Individual Time Calculation (min): 54 min   Short Term Goals: Week 2:  PT Short Term Goal 1 (Week 2): = to LTGs based on ELOS  Skilled Therapeutic Interventions/Progress Updates:    Pt received sitting in w/c and ready to participate in therapy session. Sit<>stands using RW with close supervision for safety throughout session. Ambulated ~163ft to therapy gym using RW with close supervision - continues to demonstrate decreased gait speed. MD present for morning assessment. Seated L LE AROM long arc quads x20 reps for pain management and improved joint mobility. Seated B LE ankle PF against level 1 theraband resistance x30 reps each with pt demonstrating improved motor control during this task - reiterated importance of performing this exercise 3x daily outside of therapy sessions. Repeated sit<>stands, no UE support, from lowered mat 2 sets of 12 reps with mirror feedback for equal B LE weightbearing as pt tends to shift R due to hx of L knee pain despite reporting no pain during this exercise. R LE step up/down on 6" step with B UE support on RW with CGA for steadying progressed to no UE support with min assist for lifting/lowering and balance. L L E step up/down on 4" step with B UE support on RW with CGA for steadying progressed to no UE support with min assist for lifting/lowering and balance. Ambulated ~68ft x 2 to/from hallway rail, no UE support, with min assist for balance - cuing for improved L LE hip/knee extension during stance phase. Side stepping at hallway rail with B UE support progressed to no UE support with min assist for balance. Ambulated ~141ft back to room using RW with close supervision. Pt left sitting in recliner with needs in reach and chair alarm on.  Therapy Documentation Precautions:   Precautions Precautions: Fall Restrictions Weight Bearing Restrictions: No  Pain: Continues to report L knee pain/stiffness rated as 4/10 during gait - wearing L knee brace during session and seated rest breaks provided as needed for pain management.   Therapy/Group: Individual Therapy  Tawana Scale, PT, DPT 04/20/2019, 7:49 AM

## 2019-04-20 NOTE — Progress Notes (Signed)
Occupational Therapy Weekly Progress Note  Patient Details  Name: Andrew Compton MRN: [id number] Date of Birth: 1951/05/12  Beginning of progress report period: April 12, 2019 End of progress report period: April 20, 2019  Today's Date: 04/20/2019 OT Individual Time: 1003-1105 OT Individual Time Calculation (min): 62 min    Patient has met 3 of 3 short term goals.  Pt is making steady progress with OT at this time and currently completes most selfcare tasks at a supervision level including bathing, dressing, toileting, and walk-in shower transfers.  He continues to need use of the RW for balance and support as he also still reports increased left knee pain, but this has improved some over the past couple days after MD completed injection. He still demonstrates decreased dynamic standing balance, with the need for min assist when not using an assistive device.  Feel he is on target for established modified independent level goals.  Will update some set as supervision level as well with anticipation of discharge 9/24.  Patient continues to demonstrate the following deficits: muscle weakness and decreased standing balance and decreased balance strategies and therefore will continue to benefit from skilled OT intervention to enhance overall performance with BADL, iADL and Reduce care partner burden.  Patient progressing toward long term goals..  Plan of care revisions: Some goals updated.  OT Short Term Goals Week 2:  OT Short Term Goal 1 (Week 2): Continue working on established goals at modified independent level overall.  Skilled Therapeutic Interventions/Progress Updates:    Pt completed transfer from recliner to the shower with use of the RW and supervision.  On the way, he was able to stop at the dresser and gather up his socks and his pants to donn after bathing task.  Min assist for standing balance without UE support while attempting to remove pullover shirt.  LOB to the left  and posterior when attempting task.  He was able to complete all bathing with supervision and lateral leans side to side when washing buttocks.  Supervision for all dressing sit to stand from the shower seat with pt ambulating out to his recliner to donn his TEDs and shoes.  Finished session with functional mobility down to the dayroom and back with supervision using the RW for support.  Pt left in the recliner with call button and phone in reach and safety alarm in place.    Therapy Documentation Precautions:  Precautions Precautions: Fall Precaution Comments: left knee brace for comfort Restrictions Weight Bearing Restrictions: No  Pain: Pain Assessment Pain Scale: Faces Pain Score: 3  Faces Pain Scale: Hurts a little bit Pain Type: Acute pain Pain Location: Knee Pain Orientation: Left Pain Descriptors / Indicators: Discomfort Pain Onset: With Activity Patients Stated Pain Goal: 3 Pain Intervention(s): Medication (See eMAR) ADL: See Care Tool Section for some details of ADL tasks  Therapy/Group: Individual Therapy  Sharifah Champine OTR/L 04/20/2019, 12:43 PM

## 2019-04-20 NOTE — Progress Notes (Signed)
Occupational Therapy Session Note  Patient Details  Name: [name] MRN: [id number] Date of Birth: May 17, 1951  Today's Date: 04/20/2019 OT Individual Time: 1300-1400 OT Individual Time Calculation (min): 60 min    Short Term Goals: Week 1:  OT Short Term Goal 1 (Week 1): Pt will complete toilet transfers using the RW and supervision. OT Short Term Goal 1 - Progress (Week 1): Met OT Short Term Goal 2 (Week 1): Pt will perform LB bathing and dressing sit to stand with supervision. OT Short Term Goal 2 - Progress (Week 1): Met OT Short Term Goal 3 (Week 1): Pt will complete toileting tasks sit to stand with supervision. OT Short Term Goal 3 - Progress (Week 1): Met  Skilled Therapeutic Interventions/Progress Updates:    Patient seated in recliner, ready for therapy session.  He is pleasant, cooperative and aware of needs.  Ambulation with RW room to therapy gym CS.  Trial ambulation with SPC CGA.  Reviewed and practiced HM techniques/ reach and transport of items using SPC with good carryover and safety CGA level.  Reviewed techniques to prop SPC during functional activities.  SPT with SPC to/from arm chair, sofa with CGA.  Completed standing balance and reaching to high and low surfaces with SPC and without AD CGA.   He was able to reach to floor in stance to pick up 2" objects with CGA.  UB conditioning activities without difficulty.  He ambulated back to room with [facility] CGA.  He states that he is pleased with his progress, seated in recliner at close of session with all items within reach.    Therapy Documentation Precautions:  Precautions Precautions: Fall Precaution Comments: left knee brace for comfort Restrictions Weight Bearing Restrictions: No General:   Vital Signs: Therapy Vitals Temp: 97.7 F (36.5 C) Temp Source: Oral Pulse Rate: 75 Resp: 20 BP: 115/78 Patient Position (if appropriate): Sitting Oxygen Therapy SpO2: 98 % O2 Device: Room Air Pain: Pain  Assessment Pain Scale: 0-10 Pain Score: 0-No pain Faces Pain Scale: Hurts a little bit Pain Type: Acute pain Pain Location: Knee Pain Orientation: Left Pain Descriptors / Indicators: Discomfort Pain Onset: With Activity   Therapy/Group: Individual Therapy  Carlos Levering 04/20/2019, 3:34 PM

## 2019-04-20 NOTE — Progress Notes (Signed)
[facility] PHYSICAL MEDICINE & REHABILITATION PROGRESS NOTE   Subjective/Complaints: No issues overnite  Knee doing better after injection on L side  Review of systems negative for chest pain shortness of breath nausea vomiting diarrhea   Objective:   No results found. No results for input(s): WBC, HGB, HCT, PLT in the last 72 hours. Recent Labs    04/18/19 0948  NA 136  K 3.8  CL 100  CO2 25  GLUCOSE 127*  BUN 15  CREATININE 0.81  CALCIUM 9.8    Intake/Output Summary (Last 24 hours) at 04/20/2019 0915 Last data filed at 04/20/2019 0815 Gross per 24 hour  Intake 680 ml  Output 1954 ml  Net -1274 ml     Physical Exam: Vital Signs Blood pressure (!) 145/95, pulse 70, temperature 98 F (36.7 C), temperature source Oral, resp. rate 16, height 6' (1.829 m), weight 94 kg, SpO2 96 %.   General: No acute distress Mood and affect are appropriate Heart: Regular rate and rhythm no rubs murmurs or extra sounds Lungs: Clear to auscultation, breathing unlabored, no rales or wheezes Abdomen: Positive bowel sounds, soft nontender to palpation, nondistended Extremities: No clubbing, cyanosis, or edema Skin: No evidence of breakdown, no evidence of rash Neurologic: Cranial nerves II through XII intact, motor strength is 5/5 in bilateral deltoid, bicep, tricep, grip, hip flexor, knee extensors, ankle dorsiflexor and plantar flexor Sensory exam normal sensation to light touch and proprioception in bilateral upper and lower extremities Cerebellar exam normal finger to nose to finger as well as heel to shin in bilateral upper and lower extremities Musculoskeletal: left knee effusion with suprapatellar tenderness    Assessment/Plan: 1. Functional deficits secondary to debility from complex medical illness which require 3+ hours per day of interdisciplinary therapy in a comprehensive inpatient rehab setting.  Physiatrist is providing close team supervision and 24 hour management of  active medical problems listed below.  Physiatrist and rehab team continue to assess barriers to discharge/monitor patient progress toward functional and medical goals  Care Tool:  Bathing    Body parts bathed by patient: Right arm, Left arm, Chest, Abdomen, Front perineal area, Buttocks, Right upper leg, Left upper leg, Right lower leg, Left lower leg, Face         Bathing assist Assist Level: Supervision/Verbal cueing     Upper Body Dressing/Undressing Upper body dressing   What is the patient wearing?: Pull over shirt    Upper body assist Assist Level: Minimal Assistance - Patient > 75%    Lower Body Dressing/Undressing Lower body dressing      What is the patient wearing?: Underwear/pull up, Pants     Lower body assist Assist for lower body dressing: Minimal Assistance - Patient > 75%     Toileting Toileting    Toileting assist Assist for toileting: Minimal Assistance - Patient > 75%     Transfers Chair/bed transfer  Transfers assist     Chair/bed transfer assist level: Contact Guard/Touching assist     Locomotion Ambulation   Ambulation assist   Ambulation activity did not occur: Safety/medical concerns(required use of RW and was independent prior)  Assist level: Supervision/Verbal cueing Assistive device: Walker-rolling Max distance: 156ft   Walk 10 feet activity   Assist  Walk 10 feet activity did not occur: Safety/medical concerns  Assist level: Supervision/Verbal cueing Assistive device: Walker-rolling   Walk 50 feet activity   Assist Walk 50 feet with 2 turns activity did not occur: Safety/medical concerns  Assist level: Supervision/Verbal  cueing Assistive device: Walker-rolling    Walk 150 feet activity   Assist Walk 150 feet activity did not occur: Safety/medical concerns  Assist level: Supervision/Verbal cueing Assistive device: Walker-rolling    Walk 10 feet on uneven surface  activity   Assist Walk 10 feet on  uneven surfaces activity did not occur: Safety/medical concerns(required use of RW and was independent prior)         Wheelchair     Assist Will patient use wheelchair at discharge?: No(TBD but anticipate pt will D/C at ambulatory level)             Wheelchair 50 feet with 2 turns activity    Assist            Wheelchair 150 feet activity     Assist          Blood pressure (!) 145/95, pulse 70, temperature 98 F (36.7 C), temperature source Oral, resp. rate 16, height 6' (1.829 m), weight 94 kg, SpO2 96 %.  Medical Problem List and Plan: 1.   Debility secondary to Pseudomonas bacteremia and sepsis PT, OT CIR  2. Antithrombotics: -DVT/anticoagulation:Pharmaceutical:Other (comment)--Eliquis cont low dose due to hx hematuria on higher dose eliquis ,-antiplatelet therapy: N/A 3. Pain Management:Left knee pain/effusion, persist despite conservative care, will order betamethasone and lidocaine  4. Mood:LCSW to follow for evaluation and support. -antipsychotic agents: Seroquel. 5. Neuropsych: This patientiscapable of making decisions on hisown behalf. 6. Skin/Wound Care:Routine pressure relief measures. Add protein supplement to promote healing. 7. Fluids/Electrolytes/Nutrition:Monitor I/O. K+ wnl cont 86meq Kdur daily , d/c PEG on day before or day of discharge        8. A fib with RVR: Will check EKG as continues to go in and out of flutter. Continue amiodarone, metoprolol and cardizem.   Cardiology switch to cardizem CD 360mg  daily  Vitals:   04/19/19 1930 04/20/19 0244  BP: 127/86 (!) 145/95  Pulse: 71 70  Resp: 15 16  Temp: 98.2 F (36.8 C) 98 F (36.7 C)  SpO2: 96% 96%  Blood pressure controlled except mild elevation this am , heart rate controlled 9/18 9. 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 Difficult cath due to BPH--needs coude cath. Continue flomax and  will repeat trial in a few days. UCS negative--d/c rocephin Voiding trial going well, no need for intermittent cath program 10. T2DM: Monitor BS ac/hs. Use SSI for elevated BS and resume metformin if BS trend upwards.  CBG (last 3)  Recent Labs    04/19/19 1707 04/19/19 2122 04/20/19 0618  GLUCAP 117* 132* 105*  Controlled, 9/18 11. Encephalopathy: Has resolved on Seroquel. Wean  12. Hypokalemia: K+ 3.1 on 9/7-->recheck labs today.  13. Anemia of chronic illness:Likely affected byintermittent hematuria. Will follow H/H with serial checks.    LOS: 9 days A FACE TO FACE EVALUATION WAS PERFORMED  Charlett Blake 04/20/2019, 9:15 AM

## 2019-04-21 ENCOUNTER — Inpatient Hospital Stay (HOSPITAL_COMMUNITY): Payer: Medicare Other

## 2019-04-21 DIAGNOSIS — E119 Type 2 diabetes mellitus without complications: Secondary | ICD-10-CM

## 2019-04-21 DIAGNOSIS — D638 Anemia in other chronic diseases classified elsewhere: Secondary | ICD-10-CM

## 2019-04-21 DIAGNOSIS — E876 Hypokalemia: Secondary | ICD-10-CM

## 2019-04-21 DIAGNOSIS — R339 Retention of urine, unspecified: Secondary | ICD-10-CM

## 2019-04-21 DIAGNOSIS — I4891 Unspecified atrial fibrillation: Secondary | ICD-10-CM

## 2019-04-21 LAB — GLUCOSE, CAPILLARY
Glucose-Capillary: 101 mg/dL — ABNORMAL HIGH (ref 70–99)
Glucose-Capillary: 126 mg/dL — ABNORMAL HIGH (ref 70–99)
Glucose-Capillary: 90 mg/dL (ref 70–99)
Glucose-Capillary: 156 mg/dL — ABNORMAL HIGH (ref 70–99)

## 2019-04-21 MED ORDER — QUETIAPINE FUMARATE 25 MG PO TABS
12.5000 mg | ORAL_TABLET | Freq: Two times a day (BID) | ORAL | Status: DC
  Administered 2019-04-21 – 2019-04-24 (×6): 12.5 mg via ORAL
  Filled 2019-04-21 (×6): qty 1

## 2019-04-21 NOTE — Progress Notes (Signed)
Physical Therapy Session Note  Patient Details  Name: [name] MRN: [id number] Date of Birth: May 15, 1951  Today's Date: 04/21/2019 PT Individual Time: 1401-1459 PT Individual Time Calculation (min): 66 min   Short Term Goals: Week 2:  PT Short Term Goal 1 (Week 2): = to LTGs based on ELOS  Skilled Therapeutic Interventions/Progress Updates:    Pt seated in recliner upon Pt arrival, agreeable to therapy tx and reports pain 5/10 at beginning of session, decreased to 3/10 after movement/ROM. Pt ambulated to the dayroom with RW and supervision x 150 ft. Pt used nustep this session x 8 minutes on workload 5 using LEs only for LE strengthening and ROM. Pt ambulated to the gym x 120 ft with SPC and min assist, x 1 LOB during bout of gait requiring mod assist to correct. Pt worked on dynamic balance this session to perform the following tasks without UE support: toe taps on aerobic step 2 x 10, standing on airex while performing toe taps to aerobic step 2 x 10, standing on airex while performing lateral toe taps to aerobic step x 10 per LE, mini sqauts x 20 standing on airex, and standing on rocker board for anterior/posterior shifting focus on ankle strategy over hip. Pt standing within parallel bars performed 2 x 10 hip abduction and x 20 calf raises for strengthening, cues for techniques. HEP given this session with handout provided. Pt ambulated back to room with SPC x 200 ft and min assist. Pt left in recliner with needs in reach and chair alarm set.   Therapy Documentation Precautions:  Precautions Precautions: Fall Precaution Comments: left knee brace for comfort Restrictions Weight Bearing Restrictions: No    Therapy/Group: Individual Therapy  Netta Corrigan, PT, DPT 04/21/2019, 8:15 AM

## 2019-04-21 NOTE — Progress Notes (Signed)
[facility] PHYSICAL MEDICINE & REHABILITATION PROGRESS NOTE   Subjective/Complaints: Patient seen sitting up in his chair this morning.  He states he slept well overnight.  Review of systems: Denies CP, SOB, N/V/D  Objective:   No results found. No results for input(s): WBC, HGB, HCT, PLT in the last 72 hours. No results for input(s): NA, K, CL, CO2, GLUCOSE, BUN, CREATININE, CALCIUM in the last 72 hours.  Intake/Output Summary (Last 24 hours) at 04/21/2019 1228 Last data filed at 04/21/2019 0731 Gross per 24 hour  Intake 920 ml  Output 1050 ml  Net -130 ml     Physical Exam: Vital Signs Blood pressure (!) 137/95, pulse (!) 59, temperature 98.2 F (36.8 C), temperature source Oral, resp. rate 16, height 6' (1.829 m), weight 94 kg, SpO2 96 %. Constitutional: No distress . Vital signs reviewed. HENT: Normocephalic.  Atraumatic. Eyes: EOMI. No discharge. Cardiovascular: No JVD. Respiratory: Normal effort.  No stridor. GI: Non-distended. Skin: Warm and dry.  Intact. Psych: Normal mood.  Normal behavior. Musc: No edema in extremities.  No tenderness in extremities. Neurologic: Alert Motor: Grossly 4+/5 throughout   Assessment/Plan: 1. Functional deficits secondary to debility from complex medical illness which require 3+ hours per day of interdisciplinary therapy in a comprehensive inpatient rehab setting.  Physiatrist is providing close team supervision and 24 hour management of active medical problems listed below.  Physiatrist and rehab team continue to assess barriers to discharge/monitor patient progress toward functional and medical goals  Care Tool:  Bathing    Body parts bathed by patient: Right arm, Left arm, Chest, Abdomen, Front perineal area, Buttocks, Right upper leg, Left upper leg, Right lower leg, Left lower leg, Face         Bathing assist Assist Level: Supervision/Verbal cueing     Upper Body Dressing/Undressing Upper body dressing   What is the  patient wearing?: Pull over shirt    Upper body assist Assist Level: Set up assist    Lower Body Dressing/Undressing Lower body dressing      What is the patient wearing?: Underwear/pull up, Pants     Lower body assist Assist for lower body dressing: Supervision/Verbal cueing     Toileting Toileting    Toileting assist Assist for toileting: Minimal Assistance - Patient > 75%     Transfers Chair/bed transfer  Transfers assist     Chair/bed transfer assist level: Contact Guard/Touching assist     Locomotion Ambulation   Ambulation assist   Ambulation activity did not occur: Safety/medical concerns(required use of RW and was independent prior)  Assist level: Supervision/Verbal cueing Assistive device: Walker-rolling Max distance: 12'   Walk 10 feet activity   Assist  Walk 10 feet activity did not occur: Safety/medical concerns  Assist level: Supervision/Verbal cueing Assistive device: Walker-rolling   Walk 50 feet activity   Assist Walk 50 feet with 2 turns activity did not occur: Safety/medical concerns  Assist level: Supervision/Verbal cueing Assistive device: Walker-rolling    Walk 150 feet activity   Assist Walk 150 feet activity did not occur: Safety/medical concerns  Assist level: Supervision/Verbal cueing Assistive device: Walker-rolling    Walk 10 feet on uneven surface  activity   Assist Walk 10 feet on uneven surfaces activity did not occur: Safety/medical concerns(required use of RW and was independent prior)         Wheelchair     Assist Will patient use wheelchair at discharge?: No(TBD but anticipate pt will D/C at ambulatory level)  Wheelchair 50 feet with 2 turns activity    Assist            Wheelchair 150 feet activity     Assist          Blood pressure (!) 137/95, pulse (!) 59, temperature 98.2 F (36.8 C), temperature source Oral, resp. rate 16, height 6' (1.829 m), weight 94  kg, SpO2 96 %.  Medical Problem List and Plan: 1.   Debility secondary to Pseudomonas bacteremia and sepsis  Continue CIR 2. Antithrombotics:  -DVT/anticoagulation:Pharmaceutical:Other (comment)--Eliquis cont low dose due to hx hematuria on higher dose eliquis   -antiplatelet therapy: N/A 3. Pain Management:Left knee pain improving 4. Mood:LCSW to follow for evaluation and support. -antipsychotic agents: Seroquel decreased to 12.5 twice daily on 9/19 5. Neuropsych: This patientiscapable of making decisions on hisown behalf. 6. Skin/Wound Care:Routine pressure relief measures.  Added protein supplement to promote healing. 7. Fluids/Electrolytes/Nutrition:Monitor I/Os  Plan to DC PEG on 9/23 8. A fib with RVR:   Continue amiodarone, metoprolol and cardizem CD 360mg  daily  Vitals:   04/20/19 1927 04/21/19 0434  BP: 130/89 (!) 137/95  Pulse: 72 (!) 59  Resp: 19 16  Temp: 97.7 F (36.5 C) 98.2 F (36.8 C)  SpO2: 100% 96%   Blood pressure and heart rate relatively controlled on 9/19 9. 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 Difficult cath due to BPH--needs coude cath. Continue flomax.  Improving 10. T2DM: Monitor BS ac/hs. Use SSI for elevated BS and resume metformin if BS trend upwards.  CBG (last 3)  Recent Labs    04/20/19 2146 04/21/19 0642 04/21/19 1138  GLUCAP 132* 101* 126*   Relatively controlled on 9/19 11. Encephalopathy: Resolved 12. Hypokalemia:   Potassium 3.8 on 9/16, labs ordered for Monday  Cont 51meq Kdur daily  13. Anemia of chronic illness:Likely affected byintermittent hematuria. Will follow H/H with serial checks.  Hemoglobin 10.7 on 9/14, labs ordered for Monday  LOS: 10 days A FACE TO FACE EVALUATION WAS PERFORMED  Elnathan Fulford Lorie Phenix 04/21/2019, 12:28 PM

## 2019-04-21 NOTE — Progress Notes (Signed)
Patient ambulating out of bathroom when RN entered room. Educated patient on need to wait for staff to transfer for safety. Verbalized understanding.

## 2019-04-22 DIAGNOSIS — Z931 Gastrostomy status: Secondary | ICD-10-CM

## 2019-04-22 DIAGNOSIS — R7309 Other abnormal glucose: Secondary | ICD-10-CM | POA: Diagnosis present

## 2019-04-22 LAB — GLUCOSE, CAPILLARY
Glucose-Capillary: 93 mg/dL (ref 70–99)
Glucose-Capillary: 90 mg/dL (ref 70–99)
Glucose-Capillary: 101 mg/dL — ABNORMAL HIGH (ref 70–99)
Glucose-Capillary: 136 mg/dL — ABNORMAL HIGH (ref 70–99)

## 2019-04-22 NOTE — Progress Notes (Signed)
Physical Therapy Session Note  Patient Details  Name: Andrew Compton MRN: [id number] Date of Birth: June 19, 1951  Today's Date: 04/22/2019 PT Individual Time: 1503-1558 PT Individual Time Calculation (min): 55 min   Short Term Goals: Week 2:  PT Short Term Goal 1 (Week 2): = to LTGs based on ELOS  Skilled Therapeutic Interventions/Progress Updates:    Pt seated in recliner upon PT arrival, agreeable to therapy tx and reports pain 3/10 in L knee. Pt transferred to standing with supervision and ambulated to the gym x 200 ft with RW and supervision. Pt worked on dynamic standing balance this session to perform the following tasks without UE support, CGA-min assist: ambulation without AD, standing on airex while performing ball toss against rebounder, ambulating while dribbling basketball, ambulating while tossing ball up & catching, standing on bosu, standing on bosu performing lateral weightshifts side<>side. Pt worked on strengthening this session to perform the following: 2 x 10 hip abduction standing at parallel bars, calf raises x 20 with B UE support, sit<>stands without UE support 2 x 10, step ups on aerobic step x 10 leading with each LE, squats with theraband around knees for hip abductor activation 2 x 10, and sidestepping with theraband around knees for added resistance. Pt worked on Personnel officer and dynamic balance to ambulated 2 x 120 ft without AD, cues for increased arm swing, foot positioning (L LE internally rotated), and upright posture. Pt ambulated back to room without AD x 150 ft and min assist, cues for gait pattern. Pt left in recliner with needs in reach and chair alarm set.   Therapy Documentation Precautions:  Precautions Precautions: Fall Precaution Comments: left knee brace for comfort Restrictions Weight Bearing Restrictions: No    Therapy/Group: Individual Therapy  Netta Corrigan, PT, DPT 04/22/2019, 3:18 PM

## 2019-04-22 NOTE — Progress Notes (Signed)
Slept good. Declined scheduled melatonin-"I don't need that." Excited about discharge on Thursday. Talks frequently about wife who passed away in 12-01-2022. Continent of B&B, using urinal. At 0415, PVR-181cc's. Tegaderm removed from RLE, will leave OTA, no drainage observed.Patrici Ranks A

## 2019-04-22 NOTE — Progress Notes (Signed)
Andrew Compton PHYSICAL MEDICINE & REHABILITATION PROGRESS NOTE   Subjective/Complaints: Patient seen sitting up in his chair this morning.  He states he slept well overnight.  He denies complaints.  Review of systems: Denies CP, SOB, N/V/D  Objective:   No results found. No results for input(s): WBC, HGB, HCT, PLT in the last 72 hours. No results for input(s): NA, K, CL, CO2, GLUCOSE, BUN, CREATININE, CALCIUM in the last 72 hours.  Intake/Output Summary (Last 24 hours) at 04/22/2019 1442 Last data filed at 04/22/2019 1349 Gross per 24 hour  Intake 720 ml  Output 1850 ml  Net -1130 ml     Physical Exam: Vital Signs Blood pressure 129/88, pulse 73, temperature 97.7 F (36.5 C), temperature source Oral, resp. rate 18, height 6' (1.829 m), weight 94 kg, SpO2 99 %. Constitutional: No distress . Vital signs reviewed. HENT: Normocephalic.  Atraumatic. Eyes: EOMI. No discharge. Cardiovascular: No JVD. Respiratory: Normal effort.  No stridor. GI: Non-distended. Skin: Warm and dry.  Intact. Psych: Normal mood.  Normal behavior. Musc: No edema in extremities.  No tenderness in extremities. Neurologic: Alert Motor: Grossly 4+/5 throughout, unchanged  Assessment/Plan: 1. Functional deficits secondary to debility from complex medical illness which require 3+ hours per day of interdisciplinary therapy in a comprehensive inpatient rehab setting.  Physiatrist is providing close team supervision and 24 hour management of active medical problems listed below.  Physiatrist and rehab team continue to assess barriers to discharge/monitor patient progress toward functional and medical goals  Care Tool:  Bathing    Body parts bathed by patient: Right arm, Left arm, Chest, Abdomen, Front perineal area, Buttocks, Right upper leg, Left upper leg, Right lower leg, Left lower leg, Face         Bathing assist Assist Level: Supervision/Verbal cueing     Upper Body Dressing/Undressing Upper body  dressing   What is the patient wearing?: Pull over shirt    Upper body assist Assist Level: Set up assist    Lower Body Dressing/Undressing Lower body dressing      What is the patient wearing?: Underwear/pull up, Pants     Lower body assist Assist for lower body dressing: Supervision/Verbal cueing     Toileting Toileting    Toileting assist Assist for toileting: Minimal Assistance - Patient > 75%     Transfers Chair/bed transfer  Transfers assist     Chair/bed transfer assist level: Contact Guard/Touching assist     Locomotion Ambulation   Ambulation assist   Ambulation activity did not occur: Safety/medical concerns(required use of RW and was independent prior)  Assist level: Minimal Assistance - Patient > 75% Assistive device: Cane-straight Max distance: 200 ft   Walk 10 feet activity   Assist  Walk 10 feet activity did not occur: Safety/medical concerns  Assist level: Supervision/Verbal cueing Assistive device: Walker-rolling   Walk 50 feet activity   Assist Walk 50 feet with 2 turns activity did not occur: Safety/medical concerns  Assist level: Minimal Assistance - Patient > 75% Assistive device: Cane-straight    Walk 150 feet activity   Assist Walk 150 feet activity did not occur: Safety/medical concerns  Assist level: Minimal Assistance - Patient > 75% Assistive device: Cane-straight    Walk 10 feet on uneven surface  activity   Assist Walk 10 feet on uneven surfaces activity did not occur: Safety/medical concerns(required use of RW and was independent prior)         Wheelchair     Assist Will patient use  wheelchair at discharge?: No(TBD but anticipate pt will D/C at ambulatory level)             Wheelchair 50 feet with 2 turns activity    Assist            Wheelchair 150 feet activity     Assist          Blood pressure 129/88, pulse 73, temperature 97.7 F (36.5 C), temperature source Oral,  resp. rate 18, height 6' (1.829 m), weight 94 kg, SpO2 99 %.  Medical Problem List and Plan: 1.   Debility secondary to Pseudomonas bacteremia and sepsis  Continue CIR 2. Antithrombotics:  -DVT/anticoagulation:Pharmaceutical:Other (comment)--Eliquis cont low dose due to hx hematuria on higher dose eliquis   -antiplatelet therapy: N/A 3. Pain Management:Left knee pain improved 4. Mood:LCSW to follow for evaluation and support. -antipsychotic agents: Seroquel decreased to 12.5 twice daily on 9/19, will plan to wean further 5. Neuropsych: This patientiscapable of making decisions on hisown behalf. 6. Skin/Wound Care:Routine pressure relief measures.  Added protein supplement to promote healing. 7. Fluids/Electrolytes/Nutrition:Monitor I/Os  Plan to DC PEG on 9/23 8. A fib with RVR:   Continue amiodarone, metoprolol and cardizem CD 360mg  daily  Vitals:   04/22/19 0409 04/22/19 1322  BP: 124/90 129/88  Pulse: 87 73  Resp: 16 18  Temp: 98.1 F (36.7 C) 97.7 F (36.5 C)  SpO2: 97% 99%   Blood pressure and heart rate controlled on 9/20 9. 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 Difficult cath due to BPH--needs coude cath. Continue flomax.  Improving 10. T2DM: Monitor BS ac/hs. Use SSI for elevated BS and resume metformin if BS trend upwards.  CBG (last 3)  Recent Labs    04/21/19 2055 04/22/19 0629 04/22/19 1129  GLUCAP 156* 93 90   Labile on 9/20 11. Encephalopathy: Resolved 12. Hypokalemia:   Potassium 3.8 on 9/16, labs ordered for tomorrow  Cont 37meq Kdur daily  13. Anemia of chronic illness:Likely affected byintermittent hematuria. Will follow H/H with serial checks.  Hemoglobin 10.7 on 9/14, labs ordered for tomorrow  LOS: 11 days A FACE TO [place] 04/22/2019, 2:42 PM

## 2019-04-23 ENCOUNTER — Inpatient Hospital Stay (HOSPITAL_COMMUNITY): Payer: Medicare Other

## 2019-04-23 ENCOUNTER — Inpatient Hospital Stay (HOSPITAL_COMMUNITY): Payer: Medicare Other | Admitting: Occupational Therapy

## 2019-04-23 DIAGNOSIS — I4811 Longstanding persistent atrial fibrillation: Secondary | ICD-10-CM

## 2019-04-23 LAB — GLUCOSE, CAPILLARY
Glucose-Capillary: 96 mg/dL (ref 70–99)
Glucose-Capillary: 113 mg/dL — ABNORMAL HIGH (ref 70–99)
Glucose-Capillary: 116 mg/dL — ABNORMAL HIGH (ref 70–99)
Glucose-Capillary: 119 mg/dL — ABNORMAL HIGH (ref 70–99)

## 2019-04-23 LAB — BASIC METABOLIC PANEL
Anion gap: 9 (ref 5–15)
BUN: 11 mg/dL (ref 8–23)
CO2: 28 mmol/L (ref 22–32)
Calcium: 9.3 mg/dL (ref 8.9–10.3)
Chloride: 103 mmol/L (ref 98–111)
Creatinine, Ser: 0.75 mg/dL (ref 0.61–1.24)
GFR calc Af Amer: >60 mL/min (ref >60)
GFR calc non Af Amer: >60 mL/min (ref >60)
Glucose, Bld: 151 mg/dL — ABNORMAL HIGH (ref 70–99)
Potassium: 3.4 mmol/L — ABNORMAL LOW (ref 3.5–5.1)
Sodium: 140 mmol/L (ref 135–145)

## 2019-04-23 LAB — CBC WITH DIFFERENTIAL/PLATELET
Abs Immature Granulocytes: 0.02 10*3/uL (ref 0.00–0.07)
Basophils Absolute: 0 10*3/uL (ref 0.0–0.1)
Basophils Relative: 1 %
Eosinophils Absolute: 0.2 10*3/uL (ref 0.0–0.5)
Eosinophils Relative: 3 %
HCT: 34.5 % — ABNORMAL LOW (ref 39.0–52.0)
Hemoglobin: 11.1 g/dL — ABNORMAL LOW (ref 13.0–17.0)
Immature Granulocytes: 0 %
Lymphocytes Relative: 25 %
Lymphs Abs: 1.3 10*3/uL (ref 0.7–4.0)
MCH: 30.9 pg (ref 26.0–34.0)
MCHC: 32.2 g/dL (ref 30.0–36.0)
MCV: 96.1 fL (ref 80.0–100.0)
Monocytes Absolute: 0.3 10*3/uL (ref 0.1–1.0)
Monocytes Relative: 7 %
Neutro Abs: 3.2 10*3/uL (ref 1.7–7.7)
Neutrophils Relative %: 64 %
Platelets: 179 10*3/uL (ref 150–400)
RBC: 3.59 MIL/uL — ABNORMAL LOW (ref 4.22–5.81)
RDW: 15.5 % (ref 11.5–15.5)
WBC: 5.1 10*3/uL (ref 4.0–10.5)
nRBC: 0 % (ref 0.0–0.2)

## 2019-04-23 MED ORDER — POTASSIUM CHLORIDE CRYS ER 20 MEQ PO TBCR
20.0000 meq | EXTENDED_RELEASE_TABLET | Freq: Once | ORAL | Status: AC
  Administered 2019-04-23: 20 meq via ORAL

## 2019-04-23 MED ORDER — POTASSIUM CHLORIDE CRYS ER 20 MEQ PO TBCR
40.0000 meq | EXTENDED_RELEASE_TABLET | Freq: Two times a day (BID) | ORAL | Status: AC
  Administered 2019-04-23: 20:00:00 40 meq via ORAL
  Filled 2019-04-23 (×2): qty 2

## 2019-04-23 NOTE — Progress Notes (Signed)
Occupational Therapy Session Note  Patient Details  Name: Andrew Compton MRN: [id number] Date of Birth: Nov 13, 1950  Today's Date: 04/23/2019 OT Individual Time: [id number] OT Individual Time Calculation (min): 54 min    Short Term Goals: Week 2:  OT Short Term Goal 1 (Week 2): Continue working on established goals at modified independent level overall.  Skilled Therapeutic Interventions/Progress Updates:   Pt completed functional mobility to the dayroom with supervision using the RW for support.  He worked on Automotive engineer during session with use of the Wii.  Had pt work on standing with stepping forward with the LLE and maintaining balance with min guard assist.  Integrated foam surface for standing.  He was able to maintain static standing on the foam with supervision.  For stepping on and off of the foam he needed occasional min assist.  Consistent min to mod assist was needed when attempting to step up backwards on the foam.  He completed some short distance functional mobility with min guard assist and no assistive device for 75'.  Also worked on walking tandem following tape line in the dayroom as well.  Finished session with functional mobility back to the room with supervision and use of the RW for support.     Therapy Documentation Precautions:  Precautions Precautions: Fall Precaution Comments: left knee brace for comfort Restrictions Weight Bearing Restrictions: No   Pain: Pain Assessment Pain Scale: Faces Pain Score: 0-No pain ADL: See Care Tool Section for some details of ADL or mobility  Therapy/Group: Individual Therapy  Kerry Chisolm OTR/L 04/23/2019, 12:29 PM

## 2019-04-23 NOTE — Progress Notes (Signed)
Social Work Discharge Note   The overall goal for the admission was met for:   Discharge location: Yes-HOME WITH WIFE WHO IS ABLE TO PROVIDE ASSIST  Length of Stay: Yes-15 DAYS  Discharge activity level: Yes-INDEPENDENT-SUPERVISION LEVEL  Home/community participation: Yes  Services provided included: MD, RD, PT, OT, SLP, RN, CM, Pharmacy, Neuropsych and SW  Financial Services: Medicare and Private Insurance: [place]  Follow-up services arranged: Outpatient: THERAPY DIRECT-OPPT TO CALL WIFE TO SET UP APPOINTMENT  Comments (or additional information):WIFE HAS BEEN HERE AND OBSERVED IN THERAPIES, BOTH FEEL COMFORTABLE WITH GOING HOME AND READY Patient/Family verbalized understanding of follow-up arrangements: Yes  Individual responsible for coordination of the follow-up plan: SELF & TERESA-WIFE  Confirmed correct DME delivered: Elease Hashimoto 04/23/2019    Elease Hashimoto

## 2019-04-23 NOTE — Progress Notes (Signed)
Physical Therapy Session Note  Patient Details  Name: Andrew Compton MRN: [id number] Date of Birth: [birth year]  Today's Date: 04/23/2019 PT Individual Time: 0802-0915 and [id number]  PT Individual Time Calculation (min): 73 min and 55 min  Short Term Goals: Week 2:  PT Short Term Goal 1 (Week 2): = to LTGs based on ELOS  Skilled Therapeutic Interventions/Progress Updates:   Session 1:  Pt seated in w/c upon PT arrival, agreeable to therapy tx and reports pain 3/10 in L knee, improved with mobility and ROM. Pt performed sit<>stand with CGA and ambulated from room>dayroom without AD and min assist x 150 ft with occasional staggering noted, pt able to self correct with cues. Pt transferred to nustep and used x 8 minutes on workload 5 using LEs only for strengthening and ROM. Pt ambulated to the gym without AD and min assist x 150 ft. Pt worked on strengthening this session to perform the following exercises 2 x 10 of each with cues for techniques: - 2 x 10 squats holding weighted ball - x 10 squats with weighted ball and added chest press in standing - 2 x 10 standing hip abduction - x 20 calf raises with UE support at parallel bars - 2 x 10 shoulder press, 4# L UE and unweighted R UE - 2 x 10 bicep curls 4# bilaterally - 2 x 10 shoulder external rotation 4# bicep curls  Pt worked on dynamic standing balance this session to perform the following activities without UE support, CGA-min assist for balance during each: - standing on bosu performing lateral weightshifting side to side - standing on bosu performing x 10 mini squats - sidestepping 2 x 20 ft in each directions - forwards walking with eyes closed 2 x 20 ft - backwards ambulation 2 x 20 ft - four square stepping tasks x 5 clockwise and x 5 counterclockwise  Pt participated in berg balance test this session as detailed below, scored 42/56 and discussed results with the pt. Pt ambulated back to room x 150 ft with min assist no AD  and left in w/c with needs in reach and chair alarm set.    Session 2: Pt seated in w/c upon PT arrival, agreeable to therapy tx and denies pain at rest. Pt ambulated to the gym without AD and with min assist x 200 ft, decreased gait speed noted, pt reports L knee feels stiff. Pt transferred to mat with CGA and performed the following LE strengthening exercises while laying on the mat, cue for techniques: - 2 x 10 of each: bridges in supine, supine SLR, sidelying hip abduction, prone hip extension & prone hamstring curls  Pt worked on dynamic standing balance this session to perform the following exercises without UE support: - toe taps on aerobic step, lateral toe taps on aerobic step, ambulating while stepping up/over objects, ambulating through agility ladder, in/out steps through agility ladder, and sidestepping through agility ladder.  Pt worked on functional mobility and strength in order to ascend/descend 8 steps with single rail and CGA, leading with R LE for strengthening. Pt performed 2 x 10 squats this session for strengthening, cues for techniques/form. Pt ambulated back to room without AD x 200 ft with min assist, left in recliner with needs in reach and chair alarm set.     Therapy Documentation Precautions:  Precautions Precautions: Fall Precaution Comments: left knee brace for comfort Restrictions Weight Bearing Restrictions: No   Balance Standardized Balance Assessment Standardized Balance Assessment: Berg Balance  Test Berg Balance Test Sit to Stand: Able to stand without using hands and stabilize independently Standing Unsupported: Able to stand safely 2 minutes Sitting with Back Unsupported but Feet Supported on Floor or Stool: Able to sit safely and securely 2 minutes Stand to Sit: Sits safely with minimal use of hands Transfers: Able to transfer safely, minor use of hands Standing Unsupported with Eyes Closed: Able to stand 10 seconds with supervision Standing  Ubsupported with Feet Together: Able to place feet together independently and stand for 1 minute with supervision From Standing, Reach Forward with Outstretched Arm: Can reach forward >12 cm safely (5") From Standing Position, Pick up Object from Floor: Able to pick up shoe, needs supervision From Standing Position, Turn to Look Behind Over each Shoulder: Looks behind from both sides and weight shifts well Turn 360 Degrees: Able to turn 360 degrees safely but slowly Standing Unsupported, Alternately Place Feet on Step/Stool: Able to stand independently and complete 8 steps >20 seconds Standing Unsupported, One Foot in Front: Loses balance while stepping or standing Standing on One Leg: Tries to lift leg/unable to hold 3 seconds but remains standing independently Total Score: 42   Therapy/Group: Individual Therapy  Netta Corrigan, PT, DPT 04/23/2019, 8:02 AM

## 2019-04-23 NOTE — Progress Notes (Signed)
[facility] PHYSICAL MEDICINE & REHABILITATION PROGRESS NOTE   Subjective/Complaints:   Pt reports no new issues- he denies any problems/complaints and said LBM was yesterday but plans to go again soon.  Review of systems: Denies CP, SOB, N/V/D  Objective:   No results found. Recent Labs    04/23/19 0728  WBC 5.1  HGB 11.1*  HCT 34.5*  PLT 179   Recent Labs    04/23/19 0728  NA 140  K 3.4*  CL 103  CO2 28  GLUCOSE 151*  BUN 11  CREATININE 0.75  CALCIUM 9.3    Intake/Output Summary (Last 24 hours) at 04/23/2019 1120 Last data filed at 04/23/2019 [id number] Gross per 24 hour  Intake 780 ml  Output 2000 ml  Net -1220 ml     Physical Exam: Vital Signs Blood pressure 126/83, pulse 68, temperature 97.9 F (36.6 C), temperature source Oral, resp. rate 18, height 6' (1.829 m), weight 94 kg, SpO2 94 %. Constitutional: No distress . Vital signs and labs reviewed. Sitting up in chair at bedside; appears comfortable, NAD HENT: Normocephalic.  Atraumatic. Eyes: EOMI. No discharge. Cardiovascular: No JVD. Respiratory: Normal effort.  No stridor. GI: Non-distended. Skin: Warm and dry.  Intact. Psych: Normal mood.  Normal behavior. Musc: No edema in extremities.  No tenderness in extremities. Neurologic: Alert Motor: Grossly 4+/5 throughout, unchanged  Assessment/Plan: 1. Functional deficits secondary to debility from complex medical illness which require 3+ hours per day of interdisciplinary therapy in a comprehensive inpatient rehab setting.  Physiatrist is providing close team supervision and 24 hour management of active medical problems listed below.  Physiatrist and rehab team continue to assess barriers to discharge/monitor patient progress toward functional and medical goals  Care Tool:  Bathing    Body parts bathed by patient: Right arm, Left arm, Chest, Abdomen, Front perineal area, Buttocks, Right upper leg, Left upper leg, Right lower leg, Left lower leg, Face          Bathing assist Assist Level: Supervision/Verbal cueing     Upper Body Dressing/Undressing Upper body dressing   What is the patient wearing?: Pull over shirt    Upper body assist Assist Level: Set up assist    Lower Body Dressing/Undressing Lower body dressing      What is the patient wearing?: Underwear/pull up, Pants     Lower body assist Assist for lower body dressing: Supervision/Verbal cueing     Toileting Toileting    Toileting assist Assist for toileting: Minimal Assistance - Patient > 75%     Transfers Chair/bed transfer  Transfers assist     Chair/bed transfer assist level: Contact Guard/Touching assist     Locomotion Ambulation   Ambulation assist   Ambulation activity did not occur: Safety/medical concerns(required use of RW and was independent prior)  Assist level: Minimal Assistance - Patient > 75% Assistive device: No Device Max distance: 150 ft   Walk 10 feet activity   Assist  Walk 10 feet activity did not occur: Safety/medical concerns  Assist level: Minimal Assistance - Patient > 75% Assistive device: No Device   Walk 50 feet activity   Assist Walk 50 feet with 2 turns activity did not occur: Safety/medical concerns  Assist level: Minimal Assistance - Patient > 75% Assistive device: No Device    Walk 150 feet activity   Assist Walk 150 feet activity did not occur: Safety/medical concerns  Assist level: Minimal Assistance - Patient > 75% Assistive device: No Device    Walk 10  feet on uneven surface  activity   Assist Walk 10 feet on uneven surfaces activity did not occur: Safety/medical concerns(required use of RW and was independent prior)         Wheelchair     Assist Will patient use wheelchair at discharge?: No(TBD but anticipate pt will D/C at ambulatory level)             Wheelchair 50 feet with 2 turns activity    Assist            Wheelchair 150 feet activity      Assist          Blood pressure 126/83, pulse 68, temperature 97.9 F (36.6 C), temperature source Oral, resp. rate 18, height 6' (1.829 m), weight 94 kg, SpO2 94 %.  Medical Problem List and Plan: 1.   Debility secondary to Pseudomonas bacteremia and sepsis  Continue CIR 2. Antithrombotics:  -DVT/anticoagulation:Pharmaceutical:Other (comment)--Eliquis cont low dose due to hx hematuria on higher dose eliquis   -antiplatelet therapy: N/A 3. Pain Management:Left knee pain improved 4. Mood:LCSW to follow for evaluation and support. -antipsychotic agents: Seroquel decreased to 12.5 twice daily on 9/19, will plan to wean further 5. Neuropsych: This patientiscapable of making decisions on hisown behalf. 6. Skin/Wound Care:Routine pressure relief measures.  Added protein supplement to promote healing. 7. Fluids/Electrolytes/Nutrition:Monitor I/Os  Plan to DC PEG on 9/23 8. A fib with RVR:   Continue amiodarone, metoprolol and cardizem CD 360mg  daily  Vitals:   04/22/19 1945 04/23/19 0459  BP: (!) 149/93 126/83  Pulse: 72 68  Resp: 18 18  Temp: 97.8 F (36.6 C) 97.9 F (36.6 C)  SpO2: 99% 94%   Blood pressure and heart rate controlled on 9/20 9. 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 Difficult cath due to BPH--needs coude cath. Continue flomax.  Improving 10. T2DM: Monitor BS ac/hs. Use SSI for elevated BS and resume metformin if BS trend upwards.  CBG (last 3)  Recent Labs    04/22/19 1635 04/22/19 2139 04/23/19 0632  GLUCAP 101* 136* 96   Labile on 9/20 11. Encephalopathy: Resolved 12. Hypokalemia:   Potassium 3.8 on 9/16, labs ordered for tomorrow  Cont 41meq Kdur daily  9/21- will give 40 mEq x2 in addition to regular dose  13. Anemia of chronic illness:Likely affected byintermittent hematuria. Will follow H/H with serial checks.  Hemoglobin 10.7 on 9/14, labs ordered for  tomorrow  LOS: 12 days A FACE TO FACE EVALUATION WAS PERFORMED  Andrew Compton 04/23/2019, 11:20 AM

## 2019-04-24 ENCOUNTER — Inpatient Hospital Stay (HOSPITAL_COMMUNITY): Payer: Medicare Other | Admitting: Occupational Therapy

## 2019-04-24 ENCOUNTER — Inpatient Hospital Stay (HOSPITAL_COMMUNITY): Payer: Medicare Other

## 2019-04-24 LAB — GLUCOSE, CAPILLARY
Glucose-Capillary: 113 mg/dL — ABNORMAL HIGH (ref 70–99)
Glucose-Capillary: 92 mg/dL (ref 70–99)
Glucose-Capillary: 96 mg/dL (ref 70–99)
Glucose-Capillary: 102 mg/dL — ABNORMAL HIGH (ref 70–99)

## 2019-04-24 LAB — BASIC METABOLIC PANEL
Anion gap: 9 (ref 5–15)
BUN: 13 mg/dL (ref 8–23)
CO2: 28 mmol/L (ref 22–32)
Calcium: 9.1 mg/dL (ref 8.9–10.3)
Chloride: 103 mmol/L (ref 98–111)
Creatinine, Ser: 0.77 mg/dL (ref 0.61–1.24)
GFR calc Af Amer: >60 mL/min (ref >60)
GFR calc non Af Amer: >60 mL/min (ref >60)
Glucose, Bld: 118 mg/dL — ABNORMAL HIGH (ref 70–99)
Potassium: 3.8 mmol/L (ref 3.5–5.1)
Sodium: 140 mmol/L (ref 135–145)

## 2019-04-24 MED ORDER — QUETIAPINE FUMARATE 25 MG PO TABS
12.5000 mg | ORAL_TABLET | Freq: Every day | ORAL | Status: DC
  Filled 2019-04-24: qty 1

## 2019-04-24 NOTE — Progress Notes (Signed)
[place] PHYSICAL MEDICINE & REHABILITATION PROGRESS NOTE   Subjective/Complaints: Patient seen sitting up in a chair this morning.  He states he slept well overnight.  He denies pain in his joints.  He is questions regarding PEG removal.  Review of systems: Denies CP, SOB, N/V/D  Objective:   No results found. Recent Labs    04/23/19 0728  WBC 5.1  HGB 11.1*  HCT 34.5*  PLT 179   Recent Labs    04/23/19 0728 04/24/19 0502  NA 140 140  K 3.4* 3.8  CL 103 103  CO2 28 28  GLUCOSE 151* 118*  BUN 11 13  CREATININE 0.75 0.77  CALCIUM 9.3 9.1    Intake/Output Summary (Last 24 hours) at 04/24/2019 0959 Last data filed at 04/24/2019 0700 Gross per 24 hour  Intake 600 ml  Output 1450 ml  Net -850 ml     Physical Exam: Vital Signs Blood pressure 128/85, pulse 64, temperature 98 F (36.7 C), temperature source Oral, resp. rate 16, height 6' (1.829 m), weight 94 kg, SpO2 97 %. Constitutional: No distress . Vital signs reviewed. HENT: Normocephalic.  Atraumatic. Eyes: EOMI. No discharge. Cardiovascular: No JVD. Respiratory: Normal effort.  No stridor. GI: Non-distended. Skin: Warm and dry.  Intact. Psych: Normal mood.  Normal behavior. Musc: No edema in extremities.  No tenderness in extremities. Neurologic: Alert Motor: Grossly 4+/5 throughout, stable  Assessment/Plan: 1. Functional deficits secondary to debility from complex medical illness which require 3+ hours per day of interdisciplinary therapy in a comprehensive inpatient rehab setting.  Physiatrist is providing close team supervision and 24 hour management of active medical problems listed below.  Physiatrist and rehab team continue to assess barriers to discharge/monitor patient progress toward functional and medical goals  Care Tool:  Bathing    Body parts bathed by patient: Right arm, Left arm, Chest, Abdomen, Front perineal area, Buttocks, Right upper leg, Left upper leg, Right lower leg, Left  lower leg, Face         Bathing assist Assist Level: Supervision/Verbal cueing     Upper Body Dressing/Undressing Upper body dressing   What is the patient wearing?: Pull over shirt    Upper body assist Assist Level: Set up assist    Lower Body Dressing/Undressing Lower body dressing      What is the patient wearing?: Underwear/pull up, Pants     Lower body assist Assist for lower body dressing: Supervision/Verbal cueing     Toileting Toileting    Toileting assist Assist for toileting: Minimal Assistance - Patient > 75%     Transfers Chair/bed transfer  Transfers assist     Chair/bed transfer assist level: Contact Guard/Touching assist     Locomotion Ambulation   Ambulation assist   Ambulation activity did not occur: Safety/medical concerns(required use of RW and was independent prior)  Assist level: Contact Guard/Touching assist Assistive device: No Device Max distance: 200 ft   Walk 10 feet activity   Assist  Walk 10 feet activity did not occur: Safety/medical concerns  Assist level: Contact Guard/Touching assist Assistive device: No Device   Walk 50 feet activity   Assist Walk 50 feet with 2 turns activity did not occur: Safety/medical concerns  Assist level: Contact Guard/Touching assist Assistive device: No Device    Walk 150 feet activity   Assist Walk 150 feet activity did not occur: Safety/medical concerns  Assist level: Contact Guard/Touching assist Assistive device: No Device    Walk 10 feet on uneven surface  activity   Assist Walk 10 feet on uneven surfaces activity did not occur: Safety/medical concerns(required use of RW and was independent prior)         Wheelchair     Assist Will patient use wheelchair at discharge?: No(TBD but anticipate pt will D/C at ambulatory level)             Wheelchair 50 feet with 2 turns activity    Assist            Wheelchair 150 feet activity     Assist           Blood pressure 128/85, pulse 64, temperature 98 F (36.7 C), temperature source Oral, resp. rate 16, height 6' (1.829 m), weight 94 kg, SpO2 97 %.  Medical Problem List and Plan: 1.   Debility secondary to Pseudomonas bacteremia and sepsis  Continue CIR 2. Antithrombotics:  -DVT/anticoagulation:Pharmaceutical:Other (comment)--Eliquis cont low dose due to hx hematuria on higher dose eliquis   -antiplatelet therapy: N/A 3. Pain Management:Left knee pain improved 4. Mood:LCSW to follow for evaluation and support. -antipsychotic agents: Seroquel decreased to 12.5 twice daily on 9/19, decreased to daily on 9/22 5. Neuropsych: This patientiscapable of making decisions on hisown behalf. 6. Skin/Wound Care:Routine pressure relief measures.  Added protein supplement to promote healing. 7. Fluids/Electrolytes/Nutrition:Monitor I/Os  Plan to DC PEG tomorrow, n.p.o. after midnight 8. A fib with RVR:   Continue amiodarone, metoprolol and cardizem CD 360mg  daily  Vitals:   04/23/19 1937 04/24/19 0301  BP: (!) 142/97 128/85  Pulse: 74 64  Resp: 15 16  Temp: 98 F (36.7 C) 98 F (36.7 C)  SpO2: 100% 97%   Relatively controlled on 9/22 9. 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 Difficult cath due to BPH--needs coude cath. Continue flomax.  Improving 10. T2DM: Monitor BS ac/hs. Use SSI for elevated BS and resume metformin if BS trend upwards.  CBG (last 3)  Recent Labs    04/23/19 1716 04/23/19 2106 04/24/19 0620  GLUCAP 116* 119* 113*   Relatively controlled on 9/22 11. Encephalopathy: Resolved 12. Hypokalemia:   Potassium 3.8 on 9/22  Cont 64meq Kdur daily 13. Anemia of chronic illness:Likely affected byintermittent hematuria. Will follow H/H with serial checks.  Hemoglobin 11.1 on 9/21  LOS: 13 days A FACE TO FACE EVALUATION WAS PERFORMED  Alec Jaros Lorie Phenix 04/24/2019, 9:59 AM

## 2019-04-24 NOTE — Progress Notes (Signed)
Occupational Therapy Session Note  Patient Details  Name: Andrew Compton MRN: [id number] Date of Birth: September 21, 1950  Today's Date: 04/24/2019 OT Individual Time: 1003-1100 OT Individual Time Calculation (min): 57 min    Short Term Goals: Week 2:  OT Short Term Goal 1 (Week 2): Continue working on established goals at modified independent level overall.  Skilled Therapeutic Interventions/Progress Updates:    Pt completed shower and dressing to start session.  Close supervision for functional mobility throughout the room without use of an assistive device.  He was able to complete shower sit to stand using the shower bench with supervision as well.  He then completed dressing sit to stand from the shower seat with supervision.  Next, had pt ambulate out to the therapy gym with supervision and no assistive device.  In the gym had pt work on BUE exercises with use of the medium orange therapy band as well as 3 lb dowel rod.  In standing he completed 2 sets of 10 reps shoulder flexion with the dowel rod and min assist secondary to right shoulder weakness.  He was able to complete 2 sets of 15 reps for bilateral elbow flexion as well.  He transitioned to sitting for 1 set of 15 reps of shoulder extension/row using the therapy band.  He stood to complete the next set of 15 reps.  Finished session with work on sit to stand from the [facility] for 10 reps without UE support with supervision.  Placed therapy stool against the mat to simulate toilet height for sit to stand as well and without UE support, he completed 10 reps with overall min assist.  Had pt ambulate back to the room and sit in the recliner to rest.  Call button and phone in reach with safety belt in place.    Therapy Documentation Precautions:  Precautions Precautions: Fall Precaution Comments: left knee brace for comfort Restrictions Weight Bearing Restrictions: No   Pain: Pain Assessment Pain Scale: Faces Pain Score: 0-No  pain ADL: See Care Tool Section for some details of ADL  Therapy/Group: Individual Therapy  Panda Crossin OTR/L 04/24/2019, 12:15 PM

## 2019-04-24 NOTE — Progress Notes (Signed)
Physical Therapy Session Note  Patient Details  Name: Andrew Compton MRN: [id number] Date of Birth: 25-Aug-1950  Today's Date: 04/24/2019 PT Individual Time: [phone] and [id number] PT Individual Time Calculation (min): 72 min and 70 min    Short Term Goals: Week 2:  PT Short Term Goal 1 (Week 2): = to LTGs based on ELOS  Skilled Therapeutic Interventions/Progress Updates:    Session 1:  Pt seated in w/c upon PT arrival, agreeable to therapy tx and denies pain. Pt ambulated to the gym without AD and with CGA x 200 ft. Pt worked on LE strengthening this session to perform the following exercises, cues for techniques/form: - Circuit 1: including banded sidestepping, x 10 squats with theraband for abductor activation and x 15 seated hip abduction with theraband - each exercise x 3 - Circuit 2: steps ups on aerobic step x 10 per leg, calf raises x 10 and standing hip abduction at parallel bars x 10 bilaterally - each exercise x 3 (HR 140 bpm during activity, back to 80 bpm after seated rest break between rounds) - Circuit 3: mini squats on bosu x 10, standing hip extension x 10, & partial sit ups with weighted ball x 10 - each exercise x 3 Pt ambulated to the dayroom x 150 ft without AD and with CGA working on balance and gait. Pt transferred to nustep and used x 8 minutes on workload 5 using LEs only for strengthening and endurance. Pt ambulated back to room without AD x 120 ft with CGA, cues for safety and balance. Pt left in recliner with needs in reach and chair alarm set.   Session 2: PT seated in recliner upon PT arrival, agreeable to therapy tx and denies pain. Pt ambulated to the gym x 200 ft with CGA. Pt used biodex this session working on dynamic balance without UE support, performed x 2 trials of limits of stability training and performed x 2 trials of catch game working on [place]. Pt ascended/descended 12 steps this session with single rail and reciprocal pattern for  strengthening, cues for techniques. Pt ambulated to the ortho gym x 100 ft with CGA. Pt worked on [place] for balance, performed backwards stepping strategy with random perturbations x 10 per LE with cues for large/quick step back. Pt ambulated to the dayroom x 200 ft without AD with CGA working on gait and endurance. Pt worked on dynamic standing balance while using kinetron to perform alternating LE extension without UE support, x 2 trials with min assist. Pt ambulated back to gym x 200 ft working on changes in gait speeds and sudden stops while maintaining balance. Pt worked on dynamic standing balance to perform static tandem stance x 2 trials per LE, and then tandem walking within the parallel bars x 3 forwards and x 1 backwards with min-mod assist. Pt ambulated back to room and left in recliner with needs in reach and chair alarm set.    Therapy Documentation Precautions:  Precautions Precautions: Fall Precaution Comments: left knee brace for comfort Restrictions Weight Bearing Restrictions: No    Therapy/Group: Individual Therapy  Netta Corrigan , PT, DPT 04/24/2019, 7:56 AM

## 2019-04-25 ENCOUNTER — Inpatient Hospital Stay (HOSPITAL_COMMUNITY): Payer: Medicare Other

## 2019-04-25 ENCOUNTER — Inpatient Hospital Stay (HOSPITAL_COMMUNITY): Payer: Medicare Other | Admitting: Occupational Therapy

## 2019-04-25 DIAGNOSIS — R0989 Other specified symptoms and signs involving the circulatory and respiratory systems: Secondary | ICD-10-CM

## 2019-04-25 LAB — GLUCOSE, CAPILLARY
Glucose-Capillary: 93 mg/dL (ref 70–99)
Glucose-Capillary: 114 mg/dL — ABNORMAL HIGH (ref 70–99)
Glucose-Capillary: 148 mg/dL — ABNORMAL HIGH (ref 70–99)
Glucose-Capillary: 140 mg/dL — ABNORMAL HIGH (ref 70–99)

## 2019-04-25 NOTE — Progress Notes (Signed)
Occupational Therapy Discharge Summary  Patient Details  Name: Andrew Compton MRN: [id number] Date of Birth: 08/21/1950  Today's Date: 04/25/2019 OT Individual Time: 1002-1059 OT Individual Time Calculation (min): 57 min   Session Note:  Pt ambulated to the dayroom with use of the single point cane and modified independence.  Had him complete use of the Kinetron in standing for intervals of 1-2 mins X4.  Close supervision needed for standing with resistance set on 30 cm/sec.  Progressed to working on balance with mobility walking forwards, backwards, to the side and tandem.  Supervision for balance needed when walking backwards.  Had him complete toe taps to cones and slalom course around cones forward and backwards with increased time and supervision.  Pt still with decreased weightshifts at times and slower rate of speed, but no significant LOB with any activity.  Educated pt on HEP for BUES and handout provided for which pt had completed exercises yesterday.  Call button and phone in reach with pt now modified independent in his room with use of the cane.    Patient has met 10 of 10 long term goals due to improved activity tolerance, improved balance, postural control and ability to compensate for deficits.  Patient to discharge at overall Modified Independent level.  Patient's care partner is independent to provide the necessary physical assistance at discharge.    Reasons goals not met: NA  Recommendation:  Feel pt does not warrant any further OT services at this time as he is modified independent with self care tasks and transfers to and from the bathroom using the single point cane.  Recommend follow-up PT to work further on balance.    Equipment: No equipment provided  Reasons for discharge: treatment goals met and discharge from hospital  Patient/family agrees with progress made and goals achieved: Yes  OT Discharge Precautions/Restrictions  Precautions Precautions:  Fall Restrictions Weight Bearing Restrictions: No  Pain Pain Assessment Pain Scale: Faces Pain Score: 0-No pain ADL ADL Eating: Independent Where Assessed-Eating: Chair Grooming: Independent Where Assessed-Grooming: Standing at sink Upper Body Bathing: Modified independent Where Assessed-Upper Body Bathing: Shower Lower Body Bathing: Modified independent Where Assessed-Lower Body Bathing: Shower Upper Body Dressing: Independent Where Assessed-Upper Body Dressing: Chair Lower Body Dressing: Modified independent Where Assessed-Lower Body Dressing: Chair Toileting: Modified independent Where Assessed-Toileting: Toilet Toilet Transfer: Modified independent Toilet Transfer Method: Ambulating Toilet Transfer Equipment: Raised toilet seat Walk-In Shower Transfer: Modified independent Walk-In Shower Transfer Method: Ambulating Walk-In Shower Equipment: Shower seat with back Vision Baseline Vision/History: Wears glasses Wears Glasses: At all times Patient Visual Report: No change from baseline Vision Assessment?: No apparent visual deficits Perception  Perception: Within Functional Limits Praxis Praxis: Intact Cognition Overall Cognitive Status: Within Functional Limits for tasks assessed Arousal/Alertness: Awake/alert Orientation Level: Oriented X4 Attention: Sustained;Selective Sustained Attention: Appears intact Memory: Appears intact Safety/Judgment: Appears intact Sensation Sensation Light Touch: Appears Intact Hot/Cold: Appears Intact Proprioception: Appears Intact Stereognosis: Appears Intact Coordination Gross Motor Movements are Fluid and Coordinated: No Fine Motor Movements are Fluid and Coordinated: No Coordination and Movement Description: Pt with history of 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 Motor  Motor Motor - Discharge Observations: Generalized weakness Mobility  Bed  Mobility Supine to Sit: Independent Sit to Supine: Independent Transfers Sit to Stand: Independent with assistive device Stand to Sit: Independent with assistive device  Trunk/Postural Assessment  Cervical Assessment Cervical Assessment: Exceptions to WFL(forward head) Thoracic Assessment Thoracic Assessment: Exceptions   to WFL(thoracic kyphosis) Lumbar Assessment Lumbar Assessment: Exceptions to WFL(posterior pelvic tilt)  Balance Balance Balance Assessed: Yes Static Sitting Balance Static Sitting - Balance Support: Feet supported Static Sitting - Level of Assistance: 7: Independent Dynamic Sitting Balance Dynamic Sitting - Balance Support: During functional activity Dynamic Sitting - Level of Assistance: 7: Independent Static Standing Balance Static Standing - Balance Support: During functional activity Static Standing - Level of Assistance: 7: Independent Dynamic Standing Balance Dynamic Standing - Balance Support: During functional activity Dynamic Standing - Level of Assistance: 6: Modified independent (Device/Increase time) Extremity/Trunk Assessment RUE Assessment RUE Assessment: Exceptions to WFL Active Range of Motion (AROM) Comments: Shoulder flexion AROM 0-110 degrees bilaterally secondary to history of bilateral brachial plexus injury.  All other joints AROM WFLS General Strength Comments: shoulder strength 3+/5 all other joints 4/5 throughout LUE Assessment LUE Assessment: Exceptions to WFL Active Range of Motion (AROM) Comments: Shoulder flexion AROM 0-110 degrees bilaterally secondary to history of bilateral brachial plexus injury.  Limited digit MP extension 0-60 degrees General Strength Comments: Shoulder strength 3+/5 with elbow flexion and extension at 4/5 throughout   , OTR/L 04/25/2019, 12:45 PM 

## 2019-04-25 NOTE — Progress Notes (Signed)
Physical Therapy Session Note  Patient Details  Name: Andrew Compton MRN: [id number] Date of Birth: 05/23/1951  Today's Date: 04/25/2019 PT Individual Time: 0810-0902 PT Individual Time Calculation (min): 52 min   Short Term Goals:  Week 2:  PT Short Term Goal 1 (Week 2): = to LTGs based on ELOS      Skilled Therapeutic Interventions/Progress Updates:  Pt resting in bed after Dr. Posey Pronto removed PEG; he is cleared for PT.  No c/o pain.    Bedside Therapeutic exercises performed with LEs to increase strength for functional mobility: supine- 10 x 2 bil ankle eversion/inversion:  1 x 10 each:  R/L resisted PF, R/L straight leg raises, bil bridging; 5 x 2 RL unilateral bridging focusing on smoothness of movement.  Seated: 20 x 1 bil scapular rretraction whild holding cane.  PT donned bil TEDS.   Modified independent for bed mobility.  Sitting EOB, pt donned bil shoes indepednently.  Gait training iwht SPC on unit and down to Ortho gym with supervision.  Pt retreived a pen from the floor with supervision, with LOB followed by automatic stepping to recover, supervision.  Up/down (4) 6" steps bil rails, x 3 with supervision. Simulated car transfer to SUV ht with superviison.    At end of session, pt seated in w/c with needs at hand.     Therapy Documentation Precautions:  Precautions Precautions: Fall Precaution Comments: left knee brace for comfort Restrictions Weight Bearing Restrictions: No General: pt missed 8 min PT due to being unavailable as PEG was being removed  Pain: pt denies        Therapy/Group: Individual Therapy  Lala Been 04/25/2019, 4:24 PM

## 2019-04-25 NOTE — Patient Care Conference (Signed)
Inpatient RehabilitationTeam Conference and Plan of Care Update Date: 04/25/2019   Time: 11:30 AM    Patient Name: Andrew Compton      Medical Record Number: [id number]  Date of Birth: 06-14-1951 Sex: Male         Room/Bed: 4W22C/4W22C-01 Payor Info: Payor: MEDICARE / Plan: MEDICARE PART A AND B / Product Type: *No Product type* /    Admit Date/Time:  04/11/2019  3:36 PM  Primary Diagnosis:  Physical debility  Patient Active Problem List   Diagnosis Date Noted  . Labile blood pressure   . Labile blood glucose   . S/P percutaneous endoscopic gastrostomy (PEG) tube placement ([place])   . Anemia of chronic disease   . Hypokalemia   . Diabetes mellitus type 2 in nonobese ([facility])   . Urinary retention   . Atrial fibrillation ([place])   . Physical debility 04/11/2019  . Acute on chronic respiratory failure with hypoxia ([place])   . Paroxysmal atrial fibrillation ([facility])   . Pneumonia due to Pseudomonas aeruginosa ([place])   . Chronic atrial fibrillation   . Acute pancreatitis with uninfected necrosis, unspecified   . Metabolic encephalopathy   . Severe sepsis ([place])   . Bruit 02/24/2015  . Chronic diastolic heart failure- Grade 2 05/03/2013  . PAF (paroxysmal atrial fibrillation) ([place]) 05/01/2013  . HTN (hypertension) 05/01/2013  . Gout 05/01/2013  . 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 Upper extremity weakness 04/06/2013  . GERD (gastroesophageal reflux disease) 01/17/2013  . Barrett's esophagus 01/25/2012  . Dysphagia 01/25/2012  . Mitral regurgitation 09/27/2011  . Paroxysmal atrial fibrillation (H[facility])   . Left ventricular hypertrophy   . Essential hypertension, benign     Expected Discharge Date: Expected Discharge Date: 04/26/19  Team Members Present: Physician leading conference: Dr. Delice Lesch Social Worker Present: Ovidio Kin, LCSW Nurse Present: Judee Clara, LPN PT Present: Michaelene Song, PT OT Present: Clyda Greener, OT SLP Present: Charolett Bumpers, SLP PPS Coordinator  present : Gunnar Fusi, SLP     Current Status/Progress Goal Weekly Team Focus  Bowel/Bladder             Swallow/Nutrition/ Hydration             ADL's   modified independent for selfcare tasks sit to stand and for functional transfers with use of the RW for support.  modified independent level overall  selfcare retraining, balance retraining, therapeutic exercise, DME education, pt education   Mobility   supervision for all mobility with RW, CGA for all mobility and gait with SPC or without AD, up to 200 ft and 12 steps with single rail  supervision-mod I  gait, balance, strength, endurance   Communication             Safety/Cognition/ Behavioral Observations            Pain             Skin                *See Care Plan and progress notes for long and short-term goals.     Barriers to Discharge  Current Status/Progress Possible Resolutions Date Resolved   Nursing                  PT                    OT                  SLP  SW                Discharge Planning/Teaching Needs:  Home with wife and plans on going to OPPT. Knee doing much since injection. Feels ready to go home tomorrow.      Team Discussion:  Progressing toward his mod/i level goals. Made mod/i in room. MD removed PEG this am. Medically stable for DC tomorrow. Met all therapy goals.  Revisions to Treatment Plan:  DC 9/24    Medical Summary Current Status: Debility secondary to Pseudomonas bacteremia and sepsis Weekly Focus/Goal: Improve mobility, BP  Barriers to Discharge: Medical stability   Possible Resolutions to Barriers: Therapies, follow vitals, plan for d/c tomorrow   Continued Need for Acute Rehabilitation Level of Care: The patient requires daily medical management by a physician with specialized training in physical medicine and rehabilitation for the following reasons: Direction of a multidisciplinary physical rehabilitation program to maximize functional  independence : Yes Medical management of patient stability for increased activity during participation in an intensive rehabilitation regime.: Yes Analysis of laboratory values and/or radiology reports with any subsequent need for medication adjustment and/or medical intervention. : Yes   I attest that I was present, lead the team conference, and concur with the assessment and plan of the team. Teleconference held due to COVID 19   Abaigeal Moomaw, Gardiner Rhyme 04/25/2019, 2:25 PM

## 2019-04-25 NOTE — Progress Notes (Signed)
Physical Therapy Discharge Summary  Patient Details  Name: Andrew Compton MRN: [id number] Date of Birth: Mar 30, 1951  Today's Date: 04/25/2019 PT Individual Time: 1300-1410 PT Individual Time Calculation (min): 70 min    Patient has met 9 of 9 long term goals due to improved activity tolerance, improved balance, increased strength and decreased pain.  Patient to discharge at an ambulatory level Modified Independent.     Reasons goals not met: All goals met.   Recommendation:  Patient will benefit from ongoing skilled PT services in outpatient setting to continue to advance safe functional mobility, address ongoing impairments in balance, strength, and minimize fall risk.  Equipment: No equipment provided  Reasons for discharge: treatment goals met  Patient/family agrees with progress made and goals achieved: Yes   PT Treatment Interventions: Pt seated in recliner upon PT arrival, agreeable to therapy tx and denies pain. Pt ambulated to the dayroom x 100 ft Mod I with SPC. Pt used nustep this session x 8 minutes on workload 5 using LEs only for strengthening and endurance. Pt worked on community distance ambulation and ambulation on unlevel surfaces this session- ambulated off the unit outside with [place] and Mod I. Once outside on unlevel surfaces pt requiring supervision for balance, pt ambulating on inclines/declines while outside, ambulated across mulch and grass, and ascended/descended stairs with single rail. Pt ambulated 2 x 500 + ft while outside, with one seated rest break secondary to fatigue. Pt ambulated back to unit with SPC and mod I once on level surface in the hospital. Pt worked on LE strengthening to perform sidestepping while pushing object with LE for added resistance 2 x 10 ft in each direction. Pt worked on dynamic standing balance and LE strength to perform x 10 mini squats on bosu, CGA.  Pt worked on dynamic standing balance to perform ambulation forwards with eyes  closed 2 x 20 ft, worked on backwards ambulation 2 x 20 ft. Pt participated in berg balance test as detailed below, scored 51/56 as detailed below. Pt ambulated back to room and left in recliner with needs in reach and chair alarm set.   PT Discharge Precautions/Restrictions Precautions Precautions: Fall Precaution Comments: left knee brace for comfort Restrictions Weight Bearing Restrictions: No Pain Pain Assessment Pain Scale: Faces Pain Score: 0-No pain Vision/Perception  Perception Perception: Within Functional Limits Praxis Praxis: Intact  Cognition Overall Cognitive Status: Within Functional Limits for tasks assessed Arousal/Alertness: Awake/alert Orientation Level: Oriented X4 Attention: Sustained;Selective Focused Attention: Appears intact Sustained Attention: Appears intact Memory: Appears intact Awareness: Appears intact Problem Solving: Appears intact Safety/Judgment: Appears intact Sensation Sensation Light Touch: Appears Intact Hot/Cold: Appears Intact Proprioception: Appears Intact Stereognosis: Appears Intact Additional Comments: sensation intact B LEs Coordination Gross Motor Movements are Fluid and Coordinated: Yes Fine Motor Movements are Fluid and Coordinated: Yes Coordination and Movement Description: B LE coordination WFL Motor  Motor Motor - Discharge Observations: Generalized weakness  Mobility Bed Mobility Bed Mobility: Supine to Sit;Sit to Supine Supine to Sit: Independent Sit to Supine: Independent Transfers Transfers: Sit to Stand;Stand to [employer] Transfers Sit to Stand: Independent with assistive device Stand to Sit: Independent with assistive device Stand Pivot Transfers: Independent with assistive device Transfer (Assistive device): Straight cane Locomotion  Gait Ambulation: Yes Gait Assistance: Independent with assistive device Gait Distance (Feet): 150 Feet Assistive device: Straight cane Gait Gait: Yes Gait  Pattern: Within Functional Limits Gait velocity: decreased Stairs / Additional Locomotion Stairs: Yes Stairs Assistance: Supervision/Verbal cueing Stair Management Technique: One rail  Left Number of Stairs: 12 Height of Stairs: 6 Wheelchair Mobility Wheelchair Mobility: No  Trunk/Postural Assessment  Cervical Assessment Cervical Assessment: Exceptions to WFL(forward head posture) Thoracic Assessment Thoracic Assessment: Exceptions to WFL(thoracic kyphosis) Lumbar Assessment Lumbar Assessment: Exceptions to WFL(posterior pelvic tilt) Postural Control Postural Control: Within Functional Limits  Balance Balance Balance Assessed: Yes Standardized Balance Assessment Standardized Balance Assessment: Berg Balance Test Berg Balance Test Sit to Stand: Able to stand without using hands and stabilize independently Standing Unsupported: Able to stand safely 2 minutes Sitting with Back Unsupported but Feet Supported on Floor or Stool: Able to sit safely and securely 2 minutes Stand to Sit: Sits safely with minimal use of hands Transfers: Able to transfer safely, minor use of hands Standing Unsupported with Eyes Closed: Able to stand 10 seconds safely Standing Ubsupported with Feet Together: Able to place feet together independently and stand 1 minute safely From Standing, Reach Forward with Outstretched Arm: Can reach forward >12 cm safely (5") From Standing Position, Pick up Object from Floor: Able to pick up shoe, needs supervision From Standing Position, Turn to Look Behind Over each Shoulder: Looks behind from both sides and weight shifts well Turn 360 Degrees: Able to turn 360 degrees safely in 4 seconds or less Standing Unsupported, Alternately Place Feet on Step/Stool: Able to stand independently and safely and complete 8 steps in 20 seconds Standing Unsupported, One Foot in Front: Able to place foot tandem independently and hold 30 seconds Standing on One Leg: Tries to lift  leg/unable to hold 3 seconds but remains standing independently Total Score: 51 Static Sitting Balance Static Sitting - Balance Support: Feet supported Static Sitting - Level of Assistance: 7: Independent Dynamic Sitting Balance Dynamic Sitting - Balance Support: During functional activity Dynamic Sitting - Level of Assistance: 7: Independent Static Standing Balance Static Standing - Balance Support: During functional activity Static Standing - Level of Assistance: 7: Independent Dynamic Standing Balance Dynamic Standing - Balance Support: During functional activity Dynamic Standing - Level of Assistance: 6: Modified independent (Device/Increase time) Extremity Assessment  RUE Assessment RUE Assessment: Exceptions to [facility] Active Range of Motion (AROM) Comments: Shoulder flexion AROM 0-110 degrees bilaterally secondary to history of bilateral brachial plexus injury.  All other joints AROM WFLS General Strength Comments: shoulder strength 3+/5 all other joints 4/5 throughout LUE Assessment LUE Assessment: Exceptions to [facility] Active Range of Motion (AROM) Comments: Shoulder flexion AROM 0-110 degrees bilaterally secondary to history of bilateral brachial plexus injury.  Limited digit MP extension 0-60 degrees General Strength Comments: Shoulder strength 3+/5 with elbow flexion and extension at 4/5 throughout RLE Assessment RLE Assessment: Within Functional Limits LLE Assessment LLE Assessment: Within Functional Limits    Netta Corrigan, PT, DPT 04/25/2019, 12:58 PM

## 2019-04-25 NOTE — Progress Notes (Signed)
[place] PHYSICAL MEDICINE & REHABILITATION PROGRESS NOTE   Subjective/Complaints: Patient seen sitting up in his chair this morning.  He states he slept well overnight.  He is looking forward to have his PEG tube removed.  Review of systems: Denies CP, SOB, N/V/D  Objective:   No results found. Recent Labs    04/23/19 0728  WBC 5.1  HGB 11.1*  HCT 34.5*  PLT 179   Recent Labs    04/23/19 0728 04/24/19 0502  NA 140 140  K 3.4* 3.8  CL 103 103  CO2 28 28  GLUCOSE 151* 118*  BUN 11 13  CREATININE 0.75 0.77  CALCIUM 9.3 9.1    Intake/Output Summary (Last 24 hours) at 04/25/2019 1000 Last data filed at 04/25/2019 [id number] Gross per 24 hour  Intake 120 ml  Output 1250 ml  Net -1130 ml     Physical Exam: Vital Signs Blood pressure (!) 153/94, pulse 67, temperature 98 F (36.7 C), temperature source Oral, resp. rate 16, height 6' (1.829 m), weight 94.2 kg, SpO2 98 %. Constitutional: No distress . Vital signs reviewed. HENT: Normocephalic.  Atraumatic. Eyes: EOMI. No discharge. Cardiovascular: No JVD. Respiratory: Normal effort.  No stridor. GI: Non-distended.  + PEG Skin: Warm and dry.  Intact. Psych: Normal mood.  Normal behavior. Musc: No edema in extremities.  No tenderness in extremities. Neurologic: Alert Motor: Grossly 4+/5 throughout, unchanged  Assessment/Plan: 1. Functional deficits secondary to debility from complex medical illness which require 3+ hours per day of interdisciplinary therapy in a comprehensive inpatient rehab setting.  Physiatrist is providing close team supervision and 24 hour management of active medical problems listed below.  Physiatrist and rehab team continue to assess barriers to discharge/monitor patient progress toward functional and medical goals  Care Tool:  Bathing    Body parts bathed by patient: Right arm, Left arm, Chest, Abdomen, Front perineal area, Buttocks, Right upper leg, Left upper leg, Right lower leg, Left  lower leg, Face         Bathing assist Assist Level: Supervision/Verbal cueing     Upper Body Dressing/Undressing Upper body dressing   What is the patient wearing?: Pull over shirt    Upper body assist Assist Level: Set up assist    Lower Body Dressing/Undressing Lower body dressing      What is the patient wearing?: Underwear/pull up, Pants     Lower body assist Assist for lower body dressing: Supervision/Verbal cueing     Toileting Toileting    Toileting assist Assist for toileting: Minimal Assistance - Patient > 75%     Transfers Chair/bed transfer  Transfers assist     Chair/bed transfer assist level: Supervision/Verbal cueing     Locomotion Ambulation   Ambulation assist   Ambulation activity did not occur: Safety/medical concerns(required use of RW and was independent prior)  Assist level: Supervision/Verbal cueing Assistive device: No Device Max distance: 100'   Walk 10 feet activity   Assist  Walk 10 feet activity did not occur: Safety/medical concerns  Assist level: Supervision/Verbal cueing Assistive device: No Device   Walk 50 feet activity   Assist Walk 50 feet with 2 turns activity did not occur: Safety/medical concerns  Assist level: Contact Guard/Touching assist Assistive device: No Device    Walk 150 feet activity   Assist Walk 150 feet activity did not occur: Safety/medical concerns  Assist level: Contact Guard/Touching assist Assistive device: No Device    Walk 10 feet on uneven surface  activity  Assist Walk 10 feet on uneven surfaces activity did not occur: Safety/medical concerns(required use of RW and was independent prior)         Wheelchair     Assist Will patient use wheelchair at discharge?: No(TBD but anticipate pt will D/C at ambulatory level)             Wheelchair 50 feet with 2 turns activity    Assist            Wheelchair 150 feet activity     Assist           Blood pressure (!) 153/94, pulse 67, temperature 98 F (36.7 C), temperature source Oral, resp. rate 16, height 6' (1.829 m), weight 94.2 kg, SpO2 98 %.  Medical Problem List and Plan: 1.   Debility secondary to Pseudomonas bacteremia and sepsis  Continue CIR  Team conference today to discuss current and goals and coordination of care, home and environmental barriers, and discharge planning with nursing, case manager, and therapies.  2. Antithrombotics:  -DVT/anticoagulation:Pharmaceutical:Other (comment)--Eliquis cont low dose due to hx hematuria on higher dose eliquis   -antiplatelet therapy: N/A 3. Pain Management:Left knee pain resolved 4. Mood:LCSW to follow for evaluation and support. -antipsychotic agents: Seroquel decreased to 12.5 twice daily on 9/19, decreased to daily on 9/22, DC'd on 9/24 5. Neuropsych: This patientiscapable of making decisions on hisown behalf. 6. Skin/Wound Care:Routine pressure relief measures.  Added protein supplement to promote healing. 7. Fluids/Electrolytes/Nutrition:Monitor I/Os  PEG removed at bedside without complication.  Patient resume diet with lunch.  Discussed with nursing. 8. A fib with RVR:   Continue amiodarone, metoprolol and cardizem CD 360mg  daily  Vitals:   04/24/19 2004 04/25/19 0446  BP: (!) 153/89 (!) 153/94  Pulse: 70 67  Resp: 16 16  Temp: 98 F (36.7 C) 98 F (36.7 C)  SpO2: 98% 98%   Blood pressure was slightly labile on 9/23 9. 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 Difficult cath due to BPH--needs coude cath. Continue flomax.  Improving 10. T2DM: Monitor BS ac/hs. Use SSI for elevated BS and resume metformin if BS trend upwards.  CBG (last 3)  Recent Labs    04/24/19 1706 04/24/19 2114 04/25/19 0625  GLUCAP 96 102* 93   Improving on 9/23, will DC CBGs 11. Encephalopathy: Resolved 12. Hypokalemia:   Potassium 3.8 on 9/22  Cont  18meq Kdur daily 13. Anemia of chronic illness:Likely affected byintermittent hematuria. Will follow H/H with serial checks.  Hemoglobin 11.1 on 9/21  LOS: 14 days A FACE TO FACE EVALUATION WAS PERFORMED   Lorie Phenix 04/25/2019, 10:00 AM

## 2019-04-25 NOTE — Progress Notes (Signed)
Social Work Patient ID: Andrew Compton, male   DOB: [birth year], 68 y.o.   MRN: [id number] Met with pt who feels ready for discharge tomorrow. Met all therapy goals and OPPT set up through [facility].

## 2019-04-26 LAB — GLUCOSE, CAPILLARY
Glucose-Capillary: 114 mg/dL — ABNORMAL HIGH (ref 70–99)
Glucose-Capillary: 103 mg/dL — ABNORMAL HIGH (ref 70–99)

## 2019-04-26 MED ORDER — DICLOFENAC SODIUM 1 % TD GEL: 2.0000 g | Freq: Four times a day (QID) | TRANSDERMAL | 1 refills | Status: DC

## 2019-04-26 MED ORDER — POTASSIUM CHLORIDE CRYS ER 20 MEQ PO TBCR: 20.0000 meq | EXTENDED_RELEASE_TABLET | Freq: Every day | ORAL | 0 refills | Status: DC

## 2019-04-26 MED ORDER — SERTRALINE HCL 50 MG PO TABS: 50.0000 mg | ORAL_TABLET | Freq: Every day | ORAL | 0 refills | Status: DC

## 2019-04-26 MED ORDER — LEVOTHYROXINE SODIUM 50 MCG PO TABS: 50.0000 ug | ORAL_TABLET | Freq: Every day | ORAL | 0 refills | Status: DC

## 2019-04-26 MED ORDER — MAGNESIUM OXIDE 400 (241.3 MG) MG PO TABS: 200.0000 mg | ORAL_TABLET | Freq: Two times a day (BID) | ORAL | 0 refills | Status: DC

## 2019-04-26 MED ORDER — APIXABAN 2.5 MG PO TABS: 2.5000 mg | ORAL_TABLET | Freq: Two times a day (BID) | ORAL | 0 refills | Status: DC

## 2019-04-26 MED ORDER — METOPROLOL TARTRATE 25 MG PO TABS: 25.0000 mg | ORAL_TABLET | Freq: Two times a day (BID) | ORAL | 0 refills | Status: DC

## 2019-04-26 MED ORDER — PANTOPRAZOLE SODIUM 40 MG PO TBEC: 40.0000 mg | DELAYED_RELEASE_TABLET | Freq: Every day | ORAL | 0 refills | Status: DC

## 2019-04-26 MED ORDER — DILTIAZEM HCL ER COATED BEADS 360 MG PO CP24: 360.0000 mg | ORAL_CAPSULE | Freq: Every day | ORAL | 0 refills | Status: DC

## 2019-04-26 MED ORDER — AMANTADINE HCL 100 MG PO CAPS: 100.0000 mg | ORAL_CAPSULE | Freq: Every day | ORAL | 0 refills | Status: DC

## 2019-04-26 MED ORDER — TAMSULOSIN HCL 0.4 MG PO CAPS: 0.4000 mg | ORAL_CAPSULE | Freq: Every day | ORAL | 0 refills | Status: AC

## 2019-04-26 MED ORDER — ACETAMINOPHEN 325 MG PO TABS: 650.0000 mg | ORAL_TABLET | Freq: Four times a day (QID) | ORAL | Status: DC | PRN

## 2019-04-26 NOTE — Discharge Instructions (Signed)
Inpatient Rehab Discharge Instructions  CHRISTAIN MOURE Discharge date and time: 04/26/19   Activities/Precautions/ Functional Status: Activity: no lifting, driving, or strenuous exercise till cleared by MD Diet: cardiac diet and diabetic diet Wound Care: none needed    Functional status:  ___ No restrictions     ___ Walk up steps independently ___ 24/7 supervision/assistance   ___ Walk up steps with assistance _X__ Intermittent supervision/assistance  ___ Bathe/dress independently ___ Walk with walker     ___ Bathe/dress with assistance ___ Walk Independently    ___ Shower independently ___ Walk with assistance    __X_ Shower with assistance _X__ No alcohol     ___ Return to work/school ________  Special Instructions: 1. Monitor blood sugars twice a day --alternate breakfast/supper one day with lunch/bedtime next day.  2. Need to follow up with your primary care in 1-2 weeks for evaluation and input on medication changes if needed.    COMMUNITY REFERRALS UPON DISCHARGE:    Outpatient: PT  Agency:[facility] [phone]   Date of Last Service:  Appointment Date/Time:WILL CONTACT PATIENT OR WIFE TO SET UP APPOINTMENT  Medical Equipment/Items Ordered: NO NEEDS   My questions have been answered and I understand these instructions. I will adhere to these goals and the provided educational materials after my discharge from the hospital.  Patient/Caregiver Signature _______________________________ Date __________  Clinician Signature _______________________________________ Date __________  Please bring this form and your medication list with you to all your follow-up doctor's appointments.

## 2019-04-26 NOTE — Progress Notes (Signed)
Pt discharged to home via wheelchair. Accompanied by NT. All personal belongings with him. Discharge teaching given by Pam Love-PA. No concerns nor further questions. Larina Earthly

## 2019-04-26 NOTE — Progress Notes (Signed)
[place] PHYSICAL MEDICINE & REHABILITATION PROGRESS NOTE   Subjective/Complaints: Patient seen sitting up in a chair this morning.  He states he slept well overnight.  He is ready for discharge.  Review of systems: Denies CP, SOB, N/V/D  Objective:   No results found. No results for input(s): WBC, HGB, HCT, PLT in the last 72 hours. Recent Labs    04/24/19 0502  NA 140  K 3.8  CL 103  CO2 28  GLUCOSE 118*  BUN 13  CREATININE 0.77  CALCIUM 9.1    Intake/Output Summary (Last 24 hours) at 04/26/2019 0937 Last data filed at 04/26/2019 [id number] Gross per 24 hour  Intake 840 ml  Output 652 ml  Net 188 ml     Physical Exam: Vital Signs Blood pressure (!) 135/97, pulse 63, temperature 98.4 F (36.9 C), temperature source Oral, resp. rate 18, height 6' (1.829 m), weight 94.2 kg, SpO2 97 %. Constitutional: No distress . Vital signs reviewed. HENT: Normocephalic.  Atraumatic. Eyes: EOMI. No discharge. Cardiovascular: No JVD. Respiratory: Normal effort.  No stridor. GI: Non-distended.  PEG site with dressing C/D/I Skin: Warm and dry.  Intact. Psych: Normal mood.  Normal behavior. Musc: No edema in extremities.  No tenderness in extremities. Neurologic: Alert Motor: Grossly 4+/5 throughout, stable  Assessment/Plan: 1. Functional deficits secondary to debility from complex medical illness which require 3+ hours per day of interdisciplinary therapy in a comprehensive inpatient rehab setting.  Physiatrist is providing close team supervision and 24 hour management of active medical problems listed below.  Physiatrist and rehab team continue to assess barriers to discharge/monitor patient progress toward functional and medical goals  Care Tool:  Bathing    Body parts bathed by patient: Right arm, Left arm, Chest, Abdomen, Front perineal area, Buttocks, Right upper leg, Left upper leg, Right lower leg, Left lower leg, Face         Bathing assist Assist Level: Independent  with assistive device     Upper Body Dressing/Undressing Upper body dressing   What is the patient wearing?: Pull over shirt    Upper body assist Assist Level: Independent    Lower Body Dressing/Undressing Lower body dressing      What is the patient wearing?: Underwear/pull up, Pants     Lower body assist Assist for lower body dressing: Independent with assitive device     Toileting Toileting    Toileting assist Assist for toileting: Independent with assistive device     Transfers Chair/bed transfer  Transfers assist     Chair/bed transfer assist level: Independent with assistive device Chair/bed transfer assistive device: Research officer, political party   Ambulation assist   Ambulation activity did not occur: Safety/medical concerns(required use of RW and was independent prior)  Assist level: Independent with assistive device Assistive device: Cane-straight Max distance: 150 ft   Walk 10 feet activity   Assist  Walk 10 feet activity did not occur: Safety/medical concerns  Assist level: Independent with assistive device Assistive device: Cane-straight   Walk 50 feet activity   Assist Walk 50 feet with 2 turns activity did not occur: Safety/medical concerns  Assist level: Independent with assistive device Assistive device: Cane-straight    Walk 150 feet activity   Assist Walk 150 feet activity did not occur: Safety/medical concerns  Assist level: Independent with assistive device Assistive device: Cane-straight    Walk 10 feet on uneven surface  activity   Assist Walk 10 feet on uneven surfaces activity did not occur: Safety/medical  concerns(required use of RW and was independent prior)   Assist level: Supervision/Verbal cueing     Wheelchair     Assist Will patient use wheelchair at discharge?: No             Wheelchair 50 feet with 2 turns activity    Assist            Wheelchair 150 feet activity      Assist          Blood pressure (!) 135/97, pulse 63, temperature 98.4 F (36.9 C), temperature source Oral, resp. rate 18, height 6' (1.829 m), weight 94.2 kg, SpO2 97 %.  Medical Problem List and Plan: 1.   Debility secondary to Pseudomonas bacteremia and sepsis  DC today  Will see patient for transitional care management in 1-2 weeks post-discharge 2. Antithrombotics:  -DVT/anticoagulation:Pharmaceutical:Other (comment)--Eliquis cont low dose due to hx hematuria on higher dose eliquis   -antiplatelet therapy: N/A 3. Pain Management:Left knee pain resolved 4. Mood:LCSW to follow for evaluation and support. -antipsychotic agents: Seroquel decreased to 12.5 twice daily on 9/19, decreased to daily on 9/22, DC'd on 9/24 5. Neuropsych: This patientiscapable of making decisions on hisown behalf. 6. Skin/Wound Care:Routine pressure relief measures.  Added protein supplement to promote healing. 7. Fluids/Electrolytes/Nutrition:Monitor I/Os  PEG removed at bedside without complication.   8. A fib with RVR:   Continue amiodarone, metoprolol and cardizem CD 360mg  daily  Vitals:   04/25/19 1954 04/26/19 0541  BP: (!) 141/95 (!) 135/97  Pulse: 65 63  Resp: 16 18  Temp: 97.7 F (36.5 C) 98.4 F (36.9 C)  SpO2: 95% 97%   Blood pressure relatively controlled on 9/24 9. 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 Difficult cath due to BPH--needs coude cath. Continue flomax.  Improved 10. T2DM: Monitor BS ac/hs. Use SSI for elevated BS and resume metformin if BS trend upwards.  CBG (last 3)  Recent Labs    04/25/19 1716 04/25/19 2117 04/26/19 0621  GLUCAP 148* 140* 114*   Slightly labile 9/24 11. Encephalopathy: Resolved 12. Hypokalemia:   Potassium 3.8 on 9/22  Cont 27meq Kdur daily 13. Anemia of chronic illness:Likely affected byintermittent hematuria. Will follow H/H with serial  checks.  Hemoglobin 11.1 on 9/21  LOS: 15 days A FACE TO FACE EVALUATION WAS PERFORMED  Meliana Canner Lorie Phenix 04/26/2019, 9:37 AM

## 2019-04-26 NOTE — Discharge Summary (Signed)
Physician Discharge Summary  Patient ID: Andrew Compton MRN: [id number] DOB/AGE: [birth year] 68 y.o.  Admit date: 04/11/2019 Discharge date: 04/26/2019  Discharge Diagnoses:  Principal Problem:   Physical debility Active Problems:   Barrett's esophagus   Anemia of chronic disease   Diabetes mellitus type 2 in nonobese [facility])   Atrial fibrillation [facility])   Discharged Condition: stable   Significant Diagnostic Studies: US Renal  Result Date: 04/10/2019 CLINICAL DATA:  68 year old male with hematuria. EXAM: RENAL / URINARY TRACT ULTRASOUND COMPLETE COMPARISON:  Renal ultrasound 03/31/2019. FINDINGS: Right Kidney: Renal measurements: 10.5 x 5.6 x 6.4 centimeters = volume: 183 mL . Echogenicity within normal limits. No mass or hydronephrosis visualized. Left Kidney: Renal measurements: 10.9 x 5.9 x 7.6 centimeters = volume: 255 mL. Better visualized than on the August exam although still mildly obscured by bowel gas. Cortical thickness and echogenicity appear normal. No left hydronephrosis or renal mass is evident. Bladder: Foley catheter balloon in place (image 22) with decompressed urinary bladder and generalized bladder wall thickening. Other findings: Prostate with a diameter of 54 millimeters and estimated volume of 73 milliliters. IMPRESSION: 1. Bladder remains decompressed by a Foley catheter and appears thick walled as before. 2. Negative ultrasound appearance of both kidneys. 3. Prostate enlargement with estimated volume of 73 mL. Electronically Signed   By: Genevie Ann M.D.   On: 04/10/2019 09:57   US Renal  Result Date: 03/31/2019 CLINICAL DATA:  Hematuria. EXAM: RENAL / URINARY TRACT ULTRASOUND COMPLETE COMPARISON:  None. FINDINGS: Right Kidney: Renal measurements: 12 x 5.8 x 5.7 cm = volume: 188.3 mL . Echogenicity within normal limits. No mass or hydronephrosis visualized. Left Kidney: Renal measurements: 10.2 x 4.8 x 4.3 cm = volume: 110.3 mL. Echogenicity within normal limits. No mass  or hydronephrosis visualized. Bladder: The bladder is decompressed with a Foley catheter. Bladder may be thick walled. IMPRESSION: 1. The bladder appears thick walled but is poorly evaluated due to decompression with a Foley catheter. 2. The kidneys are normal. Electronically Signed   By: Dorise Bullion III M.D   On: 03/31/2019 13:36   Dg Swallowing Func-speech Pathology  Result Date: 04/30/2019 Please refer to "Notes" tab for Speech Pathology notes.   Labs:  Basic Metabolic Panel: BMP Latest Ref Rng & Units 04/24/2019 04/23/2019 04/18/2019  Glucose 70 - 99 mg/dL 118(H) 151(H) 127(H)  BUN 8 - 23 mg/dL 13 11 15   Creatinine 0.61 - 1.24 mg/dL 0.77 0.75 0.81  Sodium 135 - 145 mmol/L 140 140 136  Potassium 3.5 - 5.1 mmol/L 3.8 3.4(L) 3.8  Chloride 98 - 111 mmol/L 103 103 100  CO2 22 - 32 mmol/L 28 28 25   Calcium 8.9 - 10.3 mg/dL 9.1 9.3 9.8    CBC: CBC Latest Ref Rng & Units 04/23/2019 04/16/2019 04/11/2019  WBC 4.0 - 10.5 K/uL 5.1 4.6 5.5  Hemoglobin 13.0 - 17.0 g/dL 11.1(L) 10.7(L) 9.6(L)  Hematocrit 39.0 - 52.0 % 34.5(L) 34.8(L) 31.0(L)  Platelets 150 - 400 K/uL 179 186 193    CBG: Recent Labs  Lab 04/25/19 1142 04/25/19 1716 04/25/19 2117 04/26/19 0621 04/26/19 1135  GLUCAP 114* 148* 140* 114* 103*    Brief HPI:   Andrew Compton is a 68 y.o. male with history of T2DM,melanoma, GERD, HTN, PAF- on eliquis who was admitted to [facility] on 02/19/29 with hypoxia and acute pancreatitis.  He went on to develop septic shock with ARDS due to Pseudomonas bacteremia and required tracheostomy as well as PEG placement due  to inability to wean off vent.  He was treated with broad-spectrum antibiotics and conservative care with improvement.  He was transferred to [facility] on 03/16/2019 for further management. His hospital course was significant for issues with agitation as well as episode of CODE BLUE on 8/25 with weaning attempts, electrolyte abnormalities, A. fib with RVR as well as hematuria.  Heart  rate improved with addition of IV Cardizem however he continued to have issues with tachycardia with activity.  He tolerated extubation and was decannulated by 09/06.  He failed voiding trials with reports of recurrent hematuria therefore renal ultrasound was done showing decompressed bladder with thick-walled with enlarged prostate and normal echogenicity.  Foley was replaced on 09/06 and blood thinners were held till 9/8 due to ongoing hematuria.  Rocephin added due to concerns of UTI.  Therapy was ongoing with improvement in activity tolerance.  He was noted to have debility affecting ADLs and mobility therefore CIR recommended for follow-up therapy   Hospital Course: Andrew Compton was admitted to rehab 04/11/2019 for inpatient therapies to consist of PT and OT at least three hours five days a week. Past admission physiatrist, therapy team and rehab RN have worked together to provide customized collaborative inpatient rehab. Blood pressures were monitored on TID basis and have been controlled. He was noted to be in A Fib with rate controlled A flutter therefore cardiology was consulted for input on medication adjustment. Dr. Radford Pax recommended changing patient back to home dose Cardizem and discontinuation of amiodarone.  Hypokalemia has resolved with supplementation and Mag Ox added due to hypomagnesemia.  His mood has been stable and Seroquel was weaned off.     He reported exacerbation of chronic left knee pain with increase in activity. Knee sleeve was added for support in addition to Voltaren gel with some improvement. Left knee was injected with 2% lidocaine on 09/15 anemia of chronic disease has been monitored with serial checks and is steadily iImproving.  His diabetes has been monitored with ac/hs CBG checks and SSI was use prn for tighter BS control.  His p.o. intake has been stable and PEG was removed 09/23. Foley was removed as mobility improved and voiding trial initiated. Rocephin was  discontinued on 9/11 as urine culture negative.   His voiding function has improved and he is voiding without signs of retention.  He has made gains during rehab stay and is at modified independent level.  He will continue to receive follow up outpatient PT at  after discharge  Rehab course: During patient's stay in rehab weekly team conferences were held to monitor patient's progress, set goals and discuss barriers to discharge. At admission, patient required min assist with ADL tasks and mod assist with mobility. His cognition was intact and speech intelligibility within functional limits therefore ST not needed during his stay. He  has had improvement in activity tolerance, balance, postural control as well as ability to compensate for deficits. He is able to complete ADL tasks at modified independent level. He is modified independent for transfers and to ambulate 150' with [place]. Family education was completed with wife regarding all aspects of care and safety.    Disposition: Home  Diet: Heart healthy.   Special Instructions: 1. No driving or strenuous activity till cleared by MD   Discharge Instructions    Ambulatory referral to Physical Medicine Rehab   Complete by: As directed    1-2 weeks transitional care appt     Allergies as of 04/26/2019  Reactions   Azithromycin Hives   Penicillins Other (See Comments)   Did it involve swelling of the face/tongue/throat, SOB, or low BP? Unknown Did it involve sudden or severe rash/hives, skin peeling, or any reaction on the inside of your mouth or nose? Unknown Did you need to seek medical attention at a hospital or doctor's office? Unknown When did it last happen?childhood If all above answers are "NO", may proceed with cephalosporin use.   Levophed [norepinephrine Bitartrate] Other (See Comments)   unknown   Sulfa Antibiotics Other (See Comments)   unknown      Medication List    STOP taking these medications   amiodarone  200 MG tablet Commonly known as: PACERONE   diltiazem 30 MG tablet Commonly known as: CARDIZEM   diltiazem 360 MG 24 hr capsule Commonly known as: TIAZAC   famotidine 20 MG tablet Commonly known as: PEPCID   HYDROcodone-acetaminophen 5-325 MG tablet Commonly known as: NORCO/VICODIN   insulin lispro 100 UNIT/ML injection Commonly known as: HUMALOG   modafinil 100 MG tablet Commonly known as: PROVIGIL   Omega-3 1000 MG Caps   QUEtiapine 25 MG tablet Commonly known as: SEROQUEL     TAKE these medications   acetaminophen 325 MG tablet Commonly known as: TYLENOL Take 2 tablets (650 mg total) by mouth every 6 (six) hours as needed for mild pain. What changed: reasons to take this   amantadine 100 MG capsule Commonly known as: SYMMETREL Take 1 capsule (100 mg total) by mouth daily.   antiseptic oral rinse Liqd 15 mLs by Mouth Rinse route 3 (three) times daily.   apixaban 2.5 MG Tabs tablet Commonly known as: ELIQUIS Take 1 tablet (2.5 mg total) by mouth 2 (two) times daily. What changed:   medication strength  how much to take   diclofenac sodium 1 % Gel Commonly known as: VOLTAREN Apply 2 g topically 4 (four) times daily. Notes to patient: To right knee. Use on and off for about a week as your knee flares up.    diltiazem 360 MG 24 hr capsule Commonly known as: CARDIZEM CD Take 1 capsule (360 mg total) by mouth daily.   levothyroxine 50 MCG tablet Commonly known as: SYNTHROID Take 1 tablet (50 mcg total) by mouth daily.   magnesium oxide 400 (241.3 Mg) MG tablet Commonly known as: MAG-OX Take 0.5 tablets (200 mg total) by mouth 2 (two) times daily.   Melatonin 3 MG Tabs Take 3 mg by mouth at bedtime. Notes to patient: Available over the counter   metoprolol tartrate 25 MG tablet Commonly known as: LOPRESSOR Take 1 tablet (25 mg total) by mouth 2 (two) times daily.   multivitamins ther. w/minerals Tabs tablet Take 1 tablet by mouth daily.    pantoprazole 40 MG tablet Commonly known as: PROTONIX Take 1 tablet (40 mg total) by mouth daily.   potassium chloride SA 20 MEQ tablet Commonly known as: K-DUR Take 1 tablet (20 mEq total) by mouth daily.   sertraline 50 MG tablet Commonly known as: ZOLOFT Take 1 tablet (50 mg total) by mouth daily.   tamsulosin 0.4 MG Caps capsule Commonly known as: FLOMAX Take 1 capsule (0.4 mg total) by mouth at bedtime.      Follow-up Information    Jamse Arn, MD Follow up.   Specialty: Physical Medicine and Rehabilitation Why: Office will call you with follow up appointment Contact information: [address] [place] [address] [phone]  Sasser, Silvestre Moment, MD. Call on 04/27/2019.   Specialty: Family Medicine Why: for post hospital follow up Contact information: [place] [address] [phone]        Lelon Perla, MD .   Specialty: Cardiology Contact information: [address] [place] [address] [phone]           Signed: Bary Leriche 04/30/2019, 12:42 PM

## 2019-04-27 DIAGNOSIS — M6281 Muscle weakness (generalized): Secondary | ICD-10-CM | POA: Diagnosis not present

## 2019-04-27 DIAGNOSIS — Z7182 Exercise counseling: Secondary | ICD-10-CM | POA: Diagnosis not present

## 2019-04-27 DIAGNOSIS — R2689 Other abnormalities of gait and mobility: Secondary | ICD-10-CM | POA: Diagnosis not present

## 2019-04-27 DIAGNOSIS — M25562 Pain in left knee: Secondary | ICD-10-CM | POA: Diagnosis not present

## 2019-04-27 DIAGNOSIS — R262 Difficulty in walking, not elsewhere classified: Secondary | ICD-10-CM | POA: Diagnosis not present

## 2019-04-30 ENCOUNTER — Telehealth: Payer: Self-pay

## 2019-04-30 DIAGNOSIS — M6281 Muscle weakness (generalized): Secondary | ICD-10-CM | POA: Diagnosis not present

## 2019-04-30 DIAGNOSIS — R2689 Other abnormalities of gait and mobility: Secondary | ICD-10-CM | POA: Diagnosis not present

## 2019-04-30 DIAGNOSIS — M25562 Pain in left knee: Secondary | ICD-10-CM | POA: Diagnosis not present

## 2019-04-30 DIAGNOSIS — R262 Difficulty in walking, not elsewhere classified: Secondary | ICD-10-CM | POA: Diagnosis not present

## 2019-04-30 DIAGNOSIS — Z7182 Exercise counseling: Secondary | ICD-10-CM | POA: Diagnosis not present

## 2019-04-30 NOTE — Telephone Encounter (Signed)
Transitional Care call-Teresa-wife    1. Are you/is patient experiencing any problems since coming home? No Are there any questions regarding any aspect of care? No 2. Are there any questions regarding medications administration/dosing? No Are meds being taken as prescribed? Yes Patient should review meds with caller to confirm 3. Have there been any falls? No 4. Has Home Health been to the house and/or have they contacted you? Outpt PT If not, have you tried to contact them? Can we help you contact them? 5. Are bowels and bladder emptying properly? Yes Are there any unexpected incontinence issues?  No If applicable, is patient following bowel/bladder programs? 6. Any fevers, problems with breathing, unexpected pain? No 7. Are there any skin problems or new areas of breakdown? No  8. Has the patient/family member arranged specialty MD follow up (ie cardiology/neurology/renal/surgical/etc)? Yes  Can we help arrange? 9. Does the patient need any other services or support that we can help arrange? No 10. Are caregivers following through as expected in assisting the patient? Yes 11. Has the patient quit smoking, drinking alcohol, or using drugs as recommended? Yes  Appointment time 2:20 pm, arrive time 2:00pm with Danella Sensing, NP [place]

## 2019-05-02 DIAGNOSIS — Z7182 Exercise counseling: Secondary | ICD-10-CM | POA: Diagnosis not present

## 2019-05-02 DIAGNOSIS — M25562 Pain in left knee: Secondary | ICD-10-CM | POA: Diagnosis not present

## 2019-05-02 DIAGNOSIS — R262 Difficulty in walking, not elsewhere classified: Secondary | ICD-10-CM | POA: Diagnosis not present

## 2019-05-02 DIAGNOSIS — R2689 Other abnormalities of gait and mobility: Secondary | ICD-10-CM | POA: Diagnosis not present

## 2019-05-02 DIAGNOSIS — M6281 Muscle weakness (generalized): Secondary | ICD-10-CM | POA: Diagnosis not present

## 2019-05-04 DIAGNOSIS — M25562 Pain in left knee: Secondary | ICD-10-CM | POA: Diagnosis not present

## 2019-05-04 DIAGNOSIS — M6281 Muscle weakness (generalized): Secondary | ICD-10-CM | POA: Diagnosis not present

## 2019-05-04 DIAGNOSIS — Z7182 Exercise counseling: Secondary | ICD-10-CM | POA: Diagnosis not present

## 2019-05-04 DIAGNOSIS — R262 Difficulty in walking, not elsewhere classified: Secondary | ICD-10-CM | POA: Diagnosis not present

## 2019-05-04 DIAGNOSIS — R2689 Other abnormalities of gait and mobility: Secondary | ICD-10-CM | POA: Diagnosis not present

## 2019-05-07 DIAGNOSIS — M6281 Muscle weakness (generalized): Secondary | ICD-10-CM | POA: Diagnosis not present

## 2019-05-07 DIAGNOSIS — R262 Difficulty in walking, not elsewhere classified: Secondary | ICD-10-CM | POA: Diagnosis not present

## 2019-05-07 DIAGNOSIS — M25562 Pain in left knee: Secondary | ICD-10-CM | POA: Diagnosis not present

## 2019-05-07 DIAGNOSIS — Z7182 Exercise counseling: Secondary | ICD-10-CM | POA: Diagnosis not present

## 2019-05-07 DIAGNOSIS — R2689 Other abnormalities of gait and mobility: Secondary | ICD-10-CM | POA: Diagnosis not present

## 2019-05-08 ENCOUNTER — Other Ambulatory Visit: Payer: Self-pay

## 2019-05-08 ENCOUNTER — Encounter: Payer: Self-pay | Admitting: Physician Assistant

## 2019-05-08 ENCOUNTER — Encounter: Payer: Medicare Other | Attending: Registered Nurse | Admitting: Registered Nurse

## 2019-05-08 ENCOUNTER — Ambulatory Visit (INDEPENDENT_AMBULATORY_CARE_PROVIDER_SITE_OTHER): Payer: Medicare Other | Admitting: General Practice

## 2019-05-08 VITALS — BP 117/78 | HR 76 | Temp 97.7°F | Ht 72.0 in | Wt 206.8 lb

## 2019-05-08 VITALS — BP 115/76 | HR 70 | Temp 97.9°F | Ht 72.0 in | Wt 204.0 lb

## 2019-05-08 DIAGNOSIS — I10 Essential (primary) hypertension: Secondary | ICD-10-CM

## 2019-05-08 DIAGNOSIS — R5381 Other malaise: Secondary | ICD-10-CM | POA: Diagnosis not present

## 2019-05-08 DIAGNOSIS — I4892 Unspecified atrial flutter: Secondary | ICD-10-CM | POA: Diagnosis not present

## 2019-05-08 DIAGNOSIS — E119 Type 2 diabetes mellitus without complications: Secondary | ICD-10-CM | POA: Insufficient documentation

## 2019-05-08 DIAGNOSIS — I480 Paroxysmal atrial fibrillation: Secondary | ICD-10-CM | POA: Diagnosis not present

## 2019-05-08 DIAGNOSIS — R319 Hematuria, unspecified: Secondary | ICD-10-CM

## 2019-05-08 MED ORDER — METOPROLOL TARTRATE 25 MG PO TABS: 25.0000 mg | ORAL_TABLET | Freq: Two times a day (BID) | ORAL | 3 refills | Status: DC

## 2019-05-08 MED ORDER — HYDROCHLOROTHIAZIDE 25 MG PO TABS: 25.0000 mg | ORAL_TABLET | Freq: Every day | ORAL | 3 refills | Status: AC

## 2019-05-08 NOTE — Progress Notes (Signed)
Cardiology Clinic Note   Patient Name: Andrew Compton Date of Encounter: 05/08/2019  Primary Care Provider:  Manon Hilding, MD Primary Cardiologist:  [name], MD  Patient Profile    Andrew Compton. [place] 68 year old male presents today for follow-up of his atrial flutter. Past Medical History    Past Medical History:  Diagnosis Date  . Acute on chronic respiratory failure with hypoxia ([place])   . Acute pancreatitis with uninfected necrosis, unspecified   . Chronic atrial fibrillation ([place])   . Essential hypertension, benign   . GERD (gastroesophageal reflux disease)   . Gouty arthropathy, unspecified   . Hypothyroidism   . Melanoma ([place])   . Metabolic encephalopathy   . Other and unspecified hyperlipidemia   . Paroxysmal atrial fibrillation ([facility])   . Pneumonia due to Pseudomonas aeruginosa ([place])   . Severe sepsis [facility])    Past Surgical History:  Procedure Laterality Date  . ESOPHAGEAL DILATION  01/15/2016   Procedure: ESOPHAGEAL DILATION;  Surgeon: Rogene Houston, MD;  Location: [facility] ENDO SUITE;  Service: Endoscopy;;  . ESOPHAGOGASTRODUODENOSCOPY N/A 01/15/2016   Procedure: ESOPHAGOGASTRODUODENOSCOPY ([facility]);  Surgeon: Rogene Houston, MD;  Location: [facility] ENDO SUITE;  Service: Endoscopy;  Laterality: N/A;  1030  . FRACTURE SURGERY Left    wrist, multiple surgeries  . MELANOMA EXCISION Left 07/2018   groin  . NEUROLYSIS MEDIAN NERVE Right 11/27/2013   Neurolysis Brachial Plexus  . SHOULDER SURGERY Right     Allergies  Allergies  Allergen Reactions  . Azithromycin Hives  . Penicillins Other (See Comments)    Did it involve swelling of the face/tongue/throat, SOB, or low BP? Unknown Did it involve sudden or severe rash/hives, skin peeling, or any reaction on the inside of your mouth or nose? Unknown Did you need to seek medical attention at a hospital or doctor's office? Unknown When did it last happen?childhood If all above answers are "NO", may proceed  with cephalosporin use.   . Levophed [Norepinephrine Bitartrate] Other (See Comments)    unknown  . Sulfa Antibiotics Other (See Comments)    unknown    History of Present Illness    Mr. Andrew Compton was last seen by Dr. Radford Pax in the hospital on 04/14/2019.  During that time cardiology was consulted for management of his permanent atrial fibrillation/flutter in the presence of pancreatitis, ARDS, respiratory failure requiring trach and PEG secondary to Klebsiella pneumonia, sepsis secondary to Pseudomonas pneumonia.  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  He was continued on Cardizem CD 360 daily, metoprolol 25 twice daily and his Eliquis.  His Eliquis was reduced due to hematuria.  He presents today for follow-up  His PMH also includes left ventricular hypertrophy, essential hypertension, mitral valve regurgitation, chronic atrial fibrillation, pneumonia due to Pseudomonas, Barrett's esophagus, dysphasia, type 2 diabetes, gout, upper extremity weakness, and anemia of chronic disease.  He presents to the clinic today and states he is starting to feel much better.  He does not notice his atrial flutter.  He is still going to physical therapy 3 times per week and has begun to be more active at home.  He states he is using a cane to prevent falls when he is on uneven ground due to his muscle weakness.  He states he has not had any further hematuria.  He also states that he is from [facility] and is looking for a PCP closer to his home.  He denies chest pain, shortness of breath,  fatigue, palpitations, melena, hematuria, hemoptysis, diaphoresis, weakness, presyncope, syncope, orthopnea, and PND.   Home Medications    Prior to Admission medications   Medication Sig Start Date End Date Taking? Authorizing Provider  acetaminophen (TYLENOL) 325 MG tablet Take 2 tablets (650 mg total) by mouth every 6 (six) hours as needed for mild pain. 04/26/19   Love, Ivan Anchors, PA-C   amantadine (SYMMETREL) 100 MG capsule Take 1 capsule (100 mg total) by mouth daily. 04/26/19   Love, Ivan Anchors, PA-C  antiseptic oral rinse (BIOTENE) LIQD 15 mLs by Mouth Rinse route 3 (three) times daily.    [provider]  apixaban (ELIQUIS) 2.5 MG TABS tablet Take 1 tablet (2.5 mg total) by mouth 2 (two) times daily. 04/26/19   Love, Ivan Anchors, PA-C  diclofenac sodium (VOLTAREN) 1 % GEL Apply 2 g topically 4 (four) times daily. 04/26/19   Love, Ivan Anchors, PA-C  diltiazem (CARDIZEM CD) 360 MG 24 hr capsule Take 1 capsule (360 mg total) by mouth daily. 04/26/19   Love, Ivan Anchors, PA-C  levothyroxine (SYNTHROID) 50 MCG tablet Take 1 tablet (50 mcg total) by mouth daily. 04/26/19   Love, Ivan Anchors, PA-C  magnesium oxide (MAG-OX) 400 (241.3 Mg) MG tablet Take 0.5 tablets (200 mg total) by mouth 2 (two) times daily. 04/26/19   Love, Ivan Anchors, PA-C  Melatonin 3 MG TABS Take 3 mg by mouth at bedtime.    [provider]  metoprolol tartrate (LOPRESSOR) 25 MG tablet Take 1 tablet (25 mg total) by mouth 2 (two) times daily. 04/26/19   Love, Ivan Anchors, PA-C  Multiple Vitamins-Minerals (MULTIVITAMINS THER. W/MINERALS) TABS tablet Take 1 tablet by mouth daily.    [provider]  pantoprazole (PROTONIX) 40 MG tablet Take 1 tablet (40 mg total) by mouth daily. 04/26/19   Love, Ivan Anchors, PA-C  potassium chloride SA (K-DUR) 20 MEQ tablet Take 1 tablet (20 mEq total) by mouth daily. 04/26/19   Love, Ivan Anchors, PA-C  sertraline (ZOLOFT) 50 MG tablet Take 1 tablet (50 mg total) by mouth daily. 04/26/19   Love, Ivan Anchors, PA-C  tamsulosin (FLOMAX) 0.4 MG CAPS capsule Take 1 capsule (0.4 mg total) by mouth at bedtime. 04/26/19   Bary Leriche, PA-C    Family History    Family History  Problem Relation Age of Onset  . Breast cancer Mother   . Other Father        unknown death cause  . Diabetes Father    He indicated that his mother is deceased. He indicated that his father is deceased. He indicated  that his sister is deceased. He indicated that his maternal grandmother is deceased. He indicated that his maternal grandfather is deceased. He indicated that his paternal grandmother is deceased. He indicated that his paternal grandfather is deceased. He indicated that his child is alive. He indicated that his other is alive.  Social History    Social History   Socioeconomic History  . Marital status: Married    Spouse name: Not on file  . Number of children: Not on file  . Years of education: Not on file  . Highest education level: Not on file  Occupational History    Employer: [employer]    Comment: Dealer  Social Needs  . Financial resource strain: Not on file  . Food insecurity    Worry: Not on file    Inability: Not on file  . Transportation needs    Medical:  Not on file    Non-medical: Not on file  Tobacco Use  . Smoking status: Former Smoker    Quit date: 08/02/1996    Years since quitting: 22.7  . Smokeless tobacco: Never Used  . Tobacco comment: quit 20 years ago  Substance and Sexual Activity  . Alcohol use: Yes    Comment: Occasional  . Drug use: No  . Sexual activity: Not Currently  Lifestyle  . Physical activity    Days per week: Not on file    Minutes per session: Not on file  . Stress: Not on file  Relationships  . Social Herbalist on phone: Not on file    Gets together: Not on file    Attends religious service: Not on file    Active member of club or organization: Not on file    Attends meetings of clubs or organizations: Not on file    Relationship status: Not on file  . Intimate partner violence    Fear of current or ex partner: Not on file    Emotionally abused: Not on file    Physically abused: Not on file    Forced sexual activity: Not on file  Other Topics Concern  . Not on file  Social History Narrative   Has no children   Lives in [place]:  No chills, fever, night sweats or  weight changes.  Cardiovascular:  No chest pain, dyspnea on exertion, edema, orthopnea, palpitations, paroxysmal nocturnal dyspnea. Dermatological: No rash, lesions/masses Respiratory: No cough, dyspnea Urologic: No hematuria, dysuria Abdominal:   No nausea, vomiting, diarrhea, bright red blood per rectum, melena, or hematemesis Neurologic:  No visual changes, wkns, changes in mental status. All other systems reviewed and are otherwise negative except as noted above.  Physical Exam    VS:  BP 115/76   Pulse 70   Temp 97.9 F (36.6 C)   Ht 6' (1.829 m)   Wt 204 lb (92.5 kg)   BMI 27.67 kg/m  , BMI Body mass index is 27.67 kg/m. GEN: Well nourished, well developed, in no acute distress. HEENT: normal. Neck: Supple, no JVD, carotid bruits, or masses. Cardiac: RRR, no murmurs, rubs, or gallops. No clubbing, cyanosis, generalized lower extremity nonpitting edema.  Radials/DP/PT 2+ and equal bilaterally.  Respiratory:  Respirations regular and unlabored, clear to auscultation bilaterally. GI: Soft, nontender, nondistended, BS + x 4. MS: no deformity or atrophy. Skin: warm and dry, no rash. Neuro:  Strength and sensation are intact. Psych: Normal affect.  Accessory Clinical Findings    ECG personally reviewed by me today-atrial flutter with 4-1 AV conduction inferior infarct undetermined age 2 bpm- No acute changes  EKG 04/18/2019 Atrial flutter with variable AV block inferior infarct undetermined age 67 bpm  Echocardiogram 01/05/2019 IMPRESSIONS   1. The left ventricle has normal systolic function with an ejection fraction of 60-65%. The cavity size was normal. There is mildly increased left ventricular wall thickness. Left ventricular diastolic Doppler parameters are consistent with impaired  relaxation.  2. The right ventricle has normal systolic function. The cavity was normal. There is no increase in right ventricular wall thickness.  3. Left atrial size was moderately  dilated.  4. There is dilatation of the aortic root measuring 42 mm.  Assessment & Plan    Atrial flutter-EKG today atrial flutter with 4 :1 AV conduction inferior infarct undetermined age 8 bpm.  Patient unaware of  flutter/palpitations. Continue metoprolol 25 mg tablet twice daily Continue diltiazem 360 mg tablet daily Increase Eliquis to 5 mg twice daily  Essential hypertension- blood pressure today 115/76 and well controlled at home Continue hydrochlorothiazide 25 mg daily Continue metoprolol tartrate 25 mg tablet twice daily Continue diltiazem 300 mg tablet daily Heart healthy low-sodium diet-salty 6 given Continue physical activity as tolerated Repeat BMP  Hematuria- has not had any hematuria since being discharged from the hospital. Increase Eliquis to 5 mg twice daily Repeat CBC in 1 week  Disposition: Follow-up with Dr. Stanford Breed in 2 months.  Deberah Pelton, NP-C 05/08/2019, 11:13 AM

## 2019-05-08 NOTE — Progress Notes (Signed)
Subjective:    Patient ID: Andrew Compton, male    DOB: [birth year], 68 y.o.   MRN: [id number]  HPI: Andrew Compton is a 68 y.o. male who is here for transitional care visit in follow up of his physical debility, atrial fibrillation and Type 2 DM in non-obese patient. [name]  was initial admitted on 02/20/2019 to the [facility] [place] [facility] [place] for hypoxia and acute pancreatitis with secondary respiratory failure.   According to Andrew Chew Pa-C discharge summary he went on to  developed septic shock with ARDS due to pseudomonas bacteremia and required tracheostomy.  He was transferred to [facility] on 03/16/19, see Dr. Laren Everts consult note.    Mr. Andrew Compton had a complicated hospital admission see discharge summaries for details.   Mr. Andrew Compton was admitted ti inpatient rehabilitation on 04/11/2019 and discharged home on 04/26/2019. He is receiving outpatient therapy at [facility] three days a week. He denies pain. He rated his pain 0. Also reports he has a good appetite.    Pain Inventory Average Pain 0 Pain Right Now 0 My pain is na  In the last 24 hours, has pain interfered with the following? General activity 0 Relation with others 0 Enjoyment of life 0 What TIME of day is your pain at its worst? na Sleep (in general) Good  Pain is worse with: na Pain improves with: na Relief from Meds: na  Mobility use a cane  Function retired  Neuro/Psych No problems in this area  Prior Studies Any changes since last visit?  no  Physicians involved in your care Any changes since last visit?  no   Family History  Problem Relation Age of Onset  . Breast cancer Mother   . Other Father        unknown death cause  . Diabetes Father    Social History   Socioeconomic History  . Marital status: Married    Spouse name: Not on file  . Number of children: Not on file  . Years of education: Not on file  . Highest education level: Not on file   Occupational History    Employer: [employer]    Comment: Dealer  Social Needs  . Financial resource strain: Not on file  . Food insecurity    Worry: Not on file    Inability: Not on file  . Transportation needs    Medical: Not on file    Non-medical: Not on file  Tobacco Use  . Smoking status: Former Smoker    Quit date: 08/02/1996    Years since quitting: 22.7  . Smokeless tobacco: Never Used  . Tobacco comment: quit 20 years ago  Substance and Sexual Activity  . Alcohol use: Yes    Comment: Occasional  . Drug use: No  . Sexual activity: Not Currently  Lifestyle  . Physical activity    Days per week: Not on file    Minutes per session: Not on file  . Stress: Not on file  Relationships  . Social Herbalist on phone: Not on file    Gets together: Not on file    Attends religious service: Not on file    Active member of club or organization: Not on file    Attends meetings of clubs or organizations: Not on file    Relationship status: Not on file  Other Topics Concern  . Not on file  Social History Narrative   Has no  children   Lives in [place]   Past Surgical History:  Procedure Laterality Date  . ESOPHAGEAL DILATION  01/15/2016   Procedure: ESOPHAGEAL DILATION;  Surgeon: Andrew Houston, MD;  [facility];  Service: Endoscopy;;  . ESOPHAGOGASTRODUODENOSCOPY N/A 01/15/2016   Procedure: ESOPHAGOGASTRODUODENOSCOPY (EGD);  Surgeon: Andrew Houston, MD;  [facility];  Service: Endoscopy;  Laterality: N/A;  1030  . FRACTURE SURGERY Left    wrist, multiple surgeries  . MELANOMA EXCISION Left 07/2018   groin  . NEUROLYSIS MEDIAN NERVE Right 11/27/2013   Neurolysis Brachial Plexus  . SHOULDER SURGERY Right    Past Medical History:  Diagnosis Date  . Acute on chronic respiratory failure with hypoxia ([place])   . Acute pancreatitis with uninfected necrosis, unspecified   . Chronic atrial fibrillation ([place])   . Essential  hypertension, benign   . GERD (gastroesophageal reflux disease)   . Gouty arthropathy, unspecified   . Hypothyroidism   . Melanoma ([place])   . Metabolic encephalopathy   . Other and unspecified hyperlipidemia   . Paroxysmal atrial fibrillation (HCC)   . Pneumonia due to Pseudomonas aeruginosa ([place])   . Severe sepsis (HCC)    BP 117/78   Pulse 76   Temp 97.7 F (36.5 C)   Ht 6' (1.829 m)   Wt 206 lb 12.8 oz (93.8 kg)   SpO2 96%   BMI 28.05 kg/m   Opioid Risk Score:   Fall Risk Score:  `1  Depression screen PHQ 2/9  No flowsheet data found.   Review of Systems  All other systems reviewed and are negative.      Objective:   Physical Exam Vitals signs and nursing note reviewed.  Constitutional:      Appearance: Normal appearance.  Neck:     Musculoskeletal: Normal range of motion and neck supple.  Cardiovascular:     Rate and Rhythm: Normal rate and regular rhythm.     Pulses: Normal pulses.     Heart sounds: Normal heart sounds.  Pulmonary:     Effort: Pulmonary effort is normal.     Breath sounds: Normal breath sounds.  Musculoskeletal:     Right lower leg: Edema present.     Left lower leg: Edema present.     Comments: Normal Muscle Bulk and Muscle Testing Reveals:  Upper Extremities: Full ROM and Muscle Strength 5/5 Lower Extremities: Full ROM and Muscle Strength 5/5 Arises from chair with ease Narrow Based  Gait   Skin:    General: Skin is warm and dry.  Neurological:     Mental Status: He is alert and oriented to person, place, and time.  Psychiatric:        Mood and Affect: Mood normal.        Behavior: Behavior normal.           Assessment & Plan:  1.Physical debility: Continue Outpatient Therapy at  [facility]. He was instructed to call his PCP to schedule HFU appointment. He verbalizes understanding. Continue to Monitor. ,2. Atrial fibrillation: Continue current medication regimen. Cardiology Following. Continue to Monitor. 3. Type 2 DM  in non-obese patient.PCP Following. Continue to Monitor.   20  minutes of face to face patient care time was spent during this visit. All questions were encouraged and answered.  F/U with Dr. Posey Pronto in 4- 6 weeks

## 2019-05-08 NOTE — Patient Instructions (Addendum)
Medication Instructions:   INCREASE Eliquis to 5 mg 2 times a day If you need a refill on your cardiac medications before your next appointment, please call your pharmacy.   Lab work: You will need to have labs (blood drawn) in 1 week:  BMP  CBC  If you have labs (blood work) drawn today and your tests are completely normal, you will receive your results only by: Marland Kitchen MyChart Message (if you have MyChart) OR . A paper copy in the mail If you have any lab test that is abnormal or we need to change your treatment, we will call you to review the results.  Testing/Procedures: NONE ordered at this time of appointment   Follow-Up: At [facility], you and your health needs are our priority.  As part of our continuing mission to provide you with exceptional heart care, we have created designated Provider Care Teams.  These Care Teams include your primary Cardiologist (physician) and Advanced Practice Providers (APPs -  Physician Assistants and Nurse Practitioners) who all work together to provide you with the care you need, when you need it. You will need a follow up appointment in 2 months with [name], MD   Any Other Special Instructions Will Be Listed Below (If Applicable).  Read information on the [place]

## 2019-05-09 DIAGNOSIS — M25562 Pain in left knee: Secondary | ICD-10-CM | POA: Diagnosis not present

## 2019-05-09 DIAGNOSIS — M6281 Muscle weakness (generalized): Secondary | ICD-10-CM | POA: Diagnosis not present

## 2019-05-09 DIAGNOSIS — Z7182 Exercise counseling: Secondary | ICD-10-CM | POA: Diagnosis not present

## 2019-05-09 DIAGNOSIS — R2689 Other abnormalities of gait and mobility: Secondary | ICD-10-CM | POA: Diagnosis not present

## 2019-05-09 DIAGNOSIS — R262 Difficulty in walking, not elsewhere classified: Secondary | ICD-10-CM | POA: Diagnosis not present

## 2019-05-10 ENCOUNTER — Encounter: Payer: Self-pay | Admitting: Registered Nurse

## 2019-05-11 DIAGNOSIS — R262 Difficulty in walking, not elsewhere classified: Secondary | ICD-10-CM | POA: Diagnosis not present

## 2019-05-11 DIAGNOSIS — Z7182 Exercise counseling: Secondary | ICD-10-CM | POA: Diagnosis not present

## 2019-05-11 DIAGNOSIS — R2689 Other abnormalities of gait and mobility: Secondary | ICD-10-CM | POA: Diagnosis not present

## 2019-05-11 DIAGNOSIS — M6281 Muscle weakness (generalized): Secondary | ICD-10-CM | POA: Diagnosis not present

## 2019-05-11 DIAGNOSIS — M25562 Pain in left knee: Secondary | ICD-10-CM | POA: Diagnosis not present

## 2019-05-15 DIAGNOSIS — R2689 Other abnormalities of gait and mobility: Secondary | ICD-10-CM | POA: Diagnosis not present

## 2019-05-15 DIAGNOSIS — M25562 Pain in left knee: Secondary | ICD-10-CM | POA: Diagnosis not present

## 2019-05-15 DIAGNOSIS — Z7182 Exercise counseling: Secondary | ICD-10-CM | POA: Diagnosis not present

## 2019-05-15 DIAGNOSIS — R262 Difficulty in walking, not elsewhere classified: Secondary | ICD-10-CM | POA: Diagnosis not present

## 2019-05-15 DIAGNOSIS — M6281 Muscle weakness (generalized): Secondary | ICD-10-CM | POA: Diagnosis not present

## 2019-05-16 DIAGNOSIS — R2689 Other abnormalities of gait and mobility: Secondary | ICD-10-CM | POA: Diagnosis not present

## 2019-05-16 DIAGNOSIS — R262 Difficulty in walking, not elsewhere classified: Secondary | ICD-10-CM | POA: Diagnosis not present

## 2019-05-16 DIAGNOSIS — M6281 Muscle weakness (generalized): Secondary | ICD-10-CM | POA: Diagnosis not present

## 2019-05-16 DIAGNOSIS — Z7182 Exercise counseling: Secondary | ICD-10-CM | POA: Diagnosis not present

## 2019-05-16 DIAGNOSIS — M25562 Pain in left knee: Secondary | ICD-10-CM | POA: Diagnosis not present

## 2019-05-17 DIAGNOSIS — I4891 Unspecified atrial fibrillation: Secondary | ICD-10-CM | POA: Diagnosis not present

## 2019-05-17 DIAGNOSIS — Z7984 Long term (current) use of oral hypoglycemic drugs: Secondary | ICD-10-CM | POA: Diagnosis not present

## 2019-05-17 DIAGNOSIS — E119 Type 2 diabetes mellitus without complications: Secondary | ICD-10-CM | POA: Diagnosis not present

## 2019-05-17 DIAGNOSIS — K8020 Calculus of gallbladder without cholecystitis without obstruction: Secondary | ICD-10-CM | POA: Diagnosis not present

## 2019-05-17 DIAGNOSIS — Z8619 Personal history of other infectious and parasitic diseases: Secondary | ICD-10-CM | POA: Diagnosis not present

## 2019-05-17 DIAGNOSIS — E039 Hypothyroidism, unspecified: Secondary | ICD-10-CM | POA: Diagnosis not present

## 2019-05-18 DIAGNOSIS — R262 Difficulty in walking, not elsewhere classified: Secondary | ICD-10-CM | POA: Diagnosis not present

## 2019-05-18 DIAGNOSIS — R2689 Other abnormalities of gait and mobility: Secondary | ICD-10-CM | POA: Diagnosis not present

## 2019-05-18 DIAGNOSIS — M25562 Pain in left knee: Secondary | ICD-10-CM | POA: Diagnosis not present

## 2019-05-18 DIAGNOSIS — Z7182 Exercise counseling: Secondary | ICD-10-CM | POA: Diagnosis not present

## 2019-05-18 DIAGNOSIS — M6281 Muscle weakness (generalized): Secondary | ICD-10-CM | POA: Diagnosis not present

## 2019-05-21 DIAGNOSIS — M25562 Pain in left knee: Secondary | ICD-10-CM | POA: Diagnosis not present

## 2019-05-21 DIAGNOSIS — R2689 Other abnormalities of gait and mobility: Secondary | ICD-10-CM | POA: Diagnosis not present

## 2019-05-21 DIAGNOSIS — Z7182 Exercise counseling: Secondary | ICD-10-CM | POA: Diagnosis not present

## 2019-05-21 DIAGNOSIS — M6281 Muscle weakness (generalized): Secondary | ICD-10-CM | POA: Diagnosis not present

## 2019-05-21 DIAGNOSIS — R262 Difficulty in walking, not elsewhere classified: Secondary | ICD-10-CM | POA: Diagnosis not present

## 2019-05-22 ENCOUNTER — Other Ambulatory Visit: Payer: Self-pay | Admitting: Physical Medicine and Rehabilitation

## 2019-05-22 DIAGNOSIS — I10 Essential (primary) hypertension: Secondary | ICD-10-CM

## 2019-05-22 DIAGNOSIS — I4892 Unspecified atrial flutter: Secondary | ICD-10-CM

## 2019-05-23 DIAGNOSIS — Z23 Encounter for immunization: Secondary | ICD-10-CM | POA: Diagnosis not present

## 2019-05-23 DIAGNOSIS — M25562 Pain in left knee: Secondary | ICD-10-CM | POA: Diagnosis not present

## 2019-05-23 DIAGNOSIS — R262 Difficulty in walking, not elsewhere classified: Secondary | ICD-10-CM | POA: Diagnosis not present

## 2019-05-23 DIAGNOSIS — M6281 Muscle weakness (generalized): Secondary | ICD-10-CM | POA: Diagnosis not present

## 2019-05-23 DIAGNOSIS — R2689 Other abnormalities of gait and mobility: Secondary | ICD-10-CM | POA: Diagnosis not present

## 2019-05-23 DIAGNOSIS — Z7182 Exercise counseling: Secondary | ICD-10-CM | POA: Diagnosis not present

## 2019-05-23 NOTE — Telephone Encounter (Signed)
Please refill meds Andrew Compton

## 2019-05-24 ENCOUNTER — Other Ambulatory Visit: Payer: Self-pay | Admitting: Physical Medicine and Rehabilitation

## 2019-05-28 DIAGNOSIS — R262 Difficulty in walking, not elsewhere classified: Secondary | ICD-10-CM | POA: Diagnosis not present

## 2019-05-28 DIAGNOSIS — R2689 Other abnormalities of gait and mobility: Secondary | ICD-10-CM | POA: Diagnosis not present

## 2019-05-28 DIAGNOSIS — Z7182 Exercise counseling: Secondary | ICD-10-CM | POA: Diagnosis not present

## 2019-05-28 DIAGNOSIS — M6281 Muscle weakness (generalized): Secondary | ICD-10-CM | POA: Diagnosis not present

## 2019-05-28 DIAGNOSIS — M25562 Pain in left knee: Secondary | ICD-10-CM | POA: Diagnosis not present

## 2019-05-31 DIAGNOSIS — M25562 Pain in left knee: Secondary | ICD-10-CM | POA: Diagnosis not present

## 2019-05-31 DIAGNOSIS — Z7182 Exercise counseling: Secondary | ICD-10-CM | POA: Diagnosis not present

## 2019-05-31 DIAGNOSIS — M6281 Muscle weakness (generalized): Secondary | ICD-10-CM | POA: Diagnosis not present

## 2019-05-31 DIAGNOSIS — R2689 Other abnormalities of gait and mobility: Secondary | ICD-10-CM | POA: Diagnosis not present

## 2019-05-31 DIAGNOSIS — R262 Difficulty in walking, not elsewhere classified: Secondary | ICD-10-CM | POA: Diagnosis not present

## 2019-06-01 DIAGNOSIS — C4359 Malignant melanoma of other part of trunk: Secondary | ICD-10-CM | POA: Diagnosis not present

## 2019-06-04 DIAGNOSIS — Z7182 Exercise counseling: Secondary | ICD-10-CM | POA: Diagnosis not present

## 2019-06-04 DIAGNOSIS — R2689 Other abnormalities of gait and mobility: Secondary | ICD-10-CM | POA: Diagnosis not present

## 2019-06-04 DIAGNOSIS — M25562 Pain in left knee: Secondary | ICD-10-CM | POA: Diagnosis not present

## 2019-06-04 DIAGNOSIS — R262 Difficulty in walking, not elsewhere classified: Secondary | ICD-10-CM | POA: Diagnosis not present

## 2019-06-04 DIAGNOSIS — M6281 Muscle weakness (generalized): Secondary | ICD-10-CM | POA: Diagnosis not present

## 2019-06-07 DIAGNOSIS — M6281 Muscle weakness (generalized): Secondary | ICD-10-CM | POA: Diagnosis not present

## 2019-06-07 DIAGNOSIS — R262 Difficulty in walking, not elsewhere classified: Secondary | ICD-10-CM | POA: Diagnosis not present

## 2019-06-07 DIAGNOSIS — M25562 Pain in left knee: Secondary | ICD-10-CM | POA: Diagnosis not present

## 2019-06-07 DIAGNOSIS — R2689 Other abnormalities of gait and mobility: Secondary | ICD-10-CM | POA: Diagnosis not present

## 2019-06-07 DIAGNOSIS — Z7182 Exercise counseling: Secondary | ICD-10-CM | POA: Diagnosis not present

## 2019-06-11 DIAGNOSIS — M25562 Pain in left knee: Secondary | ICD-10-CM | POA: Diagnosis not present

## 2019-06-11 DIAGNOSIS — Z7182 Exercise counseling: Secondary | ICD-10-CM | POA: Diagnosis not present

## 2019-06-11 DIAGNOSIS — M6281 Muscle weakness (generalized): Secondary | ICD-10-CM | POA: Diagnosis not present

## 2019-06-11 DIAGNOSIS — R262 Difficulty in walking, not elsewhere classified: Secondary | ICD-10-CM | POA: Diagnosis not present

## 2019-06-11 DIAGNOSIS — R2689 Other abnormalities of gait and mobility: Secondary | ICD-10-CM | POA: Diagnosis not present

## 2019-06-14 DIAGNOSIS — R2689 Other abnormalities of gait and mobility: Secondary | ICD-10-CM | POA: Diagnosis not present

## 2019-06-14 DIAGNOSIS — R262 Difficulty in walking, not elsewhere classified: Secondary | ICD-10-CM | POA: Diagnosis not present

## 2019-06-14 DIAGNOSIS — Z7182 Exercise counseling: Secondary | ICD-10-CM | POA: Diagnosis not present

## 2019-06-14 DIAGNOSIS — M6281 Muscle weakness (generalized): Secondary | ICD-10-CM | POA: Diagnosis not present

## 2019-06-14 DIAGNOSIS — M25562 Pain in left knee: Secondary | ICD-10-CM | POA: Diagnosis not present

## 2019-06-19 ENCOUNTER — Other Ambulatory Visit: Payer: Self-pay | Admitting: Physical Medicine and Rehabilitation

## 2019-06-19 ENCOUNTER — Other Ambulatory Visit: Payer: Self-pay | Admitting: Cardiology

## 2019-06-19 ENCOUNTER — Telehealth: Payer: Self-pay | Admitting: *Deleted

## 2019-06-19 DIAGNOSIS — I480 Paroxysmal atrial fibrillation: Secondary | ICD-10-CM

## 2019-06-19 DIAGNOSIS — R262 Difficulty in walking, not elsewhere classified: Secondary | ICD-10-CM | POA: Diagnosis not present

## 2019-06-19 DIAGNOSIS — R2689 Other abnormalities of gait and mobility: Secondary | ICD-10-CM | POA: Diagnosis not present

## 2019-06-19 DIAGNOSIS — M6281 Muscle weakness (generalized): Secondary | ICD-10-CM | POA: Diagnosis not present

## 2019-06-19 DIAGNOSIS — M25562 Pain in left knee: Secondary | ICD-10-CM | POA: Diagnosis not present

## 2019-06-19 DIAGNOSIS — Z7182 Exercise counseling: Secondary | ICD-10-CM | POA: Diagnosis not present

## 2019-06-19 NOTE — Telephone Encounter (Signed)
Schedule 24 hour holter to assess hr [name]

## 2019-06-19 NOTE — Telephone Encounter (Signed)
Spoke with pt regarding my chart message, for the last week he has noticed getting tired in rehab and over the weekend he was getting SOB walking around the house. At rehab today he was having to stop to catch his breath and they checked his heart rate and it is getting up to 109-110 bpm. It will return after a couple minutes to normal. He is currently on metoprolol 25 mg twice daily and diltiazem 360 mg once daily. Will forward for dr Stanford Breed review

## 2019-06-20 ENCOUNTER — Other Ambulatory Visit: Payer: Self-pay | Admitting: *Deleted

## 2019-06-20 DIAGNOSIS — I480 Paroxysmal atrial fibrillation: Secondary | ICD-10-CM

## 2019-06-20 DIAGNOSIS — Z20828 Contact with and (suspected) exposure to other viral communicable diseases: Secondary | ICD-10-CM | POA: Diagnosis not present

## 2019-06-20 NOTE — Telephone Encounter (Signed)
3 day ZIO XT long term holter monitor to be mailed to the patients home.  Patient aware he needs to wear the monitor at least 24 hours.  Instructions reviewed as they are included in the monitor kit.

## 2019-06-20 NOTE — Telephone Encounter (Signed)
Patient aware of dr Jacalyn Lefevre recommendation via my chart. Order placed for monitor and address confirmed.

## 2019-06-25 ENCOUNTER — Ambulatory Visit (INDEPENDENT_AMBULATORY_CARE_PROVIDER_SITE_OTHER): Payer: Medicare Other

## 2019-06-25 DIAGNOSIS — I480 Paroxysmal atrial fibrillation: Secondary | ICD-10-CM

## 2019-06-25 DIAGNOSIS — R140 Abdominal distension (gaseous): Secondary | ICD-10-CM | POA: Diagnosis not present

## 2019-06-25 DIAGNOSIS — K8020 Calculus of gallbladder without cholecystitis without obstruction: Secondary | ICD-10-CM | POA: Diagnosis not present

## 2019-06-25 DIAGNOSIS — Z23 Encounter for immunization: Secondary | ICD-10-CM | POA: Diagnosis not present

## 2019-07-03 DIAGNOSIS — H5703 Miosis: Secondary | ICD-10-CM | POA: Diagnosis not present

## 2019-07-03 DIAGNOSIS — H2511 Age-related nuclear cataract, right eye: Secondary | ICD-10-CM | POA: Diagnosis not present

## 2019-07-05 NOTE — Progress Notes (Signed)
HPI: Fu atrial fibrillation. 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 He then developed sepsis. During the admission he was found to have recurrent paroxysmal atrial fibrillation. Patient was treated with Cardizem and metoprolol for rate control. He was placed on apixaban. Monitor 3/15 showed sinus with pacs and PVCs. Echocardiogram June 2020 showed normal LV function, mild left ventricular hypertrophy, moderate left atrial enlargement and mildly dilated aortic root measuring 42 mm.  Abdominal ultrasound June 2020 showed right common iliac artery measuring 2.5 cm and left common iliac artery measuring 2.1 cm.  There was an abdominal aortic aneurysm measuring 3.5 cm.  Patient was admitted July 2020 with pancreatitis.  He developed septic shock with Pseudomonas bacteremia, ARDS, portal vein thrombosis and ultimately required tracheostomy and PEG placement.  He was noted to have recurrent atrial fibrillation/flutter which was treated with rate control and anticoagulation.  Monitor December 2020 showed atrial fibrillation with PVCs or aberrantly conducted beats, rate controlled.  Since last seen, he has some fatigue but denies dyspnea, chest pain, palpitations, syncope or bleeding.  Current Outpatient Medications  Medication Sig Dispense Refill  . acetaminophen (TYLENOL) 325 MG tablet Take 2 tablets (650 mg total) by mouth every 6 (six) hours as needed for mild pain.    Marland Kitchen antiseptic oral rinse (BIOTENE) LIQD 15 mLs by Mouth Rinse route 3 (three) times daily.    Marland Kitchen apixaban (ELIQUIS) 5 MG TABS tablet Take 5 mg by mouth 2 (two) times daily.    . diclofenac Sodium (VOLTAREN) 1 % GEL APPLY 2 GRAMS TO AFFECTED AREA 4 TIMES A DAY 100 g 1  . diltiazem (CARDIZEM CD) 360 MG 24 hr capsule TAKE 1 CAPSULE BY MOUTH EVERY DAY 30 capsule 0  . hydrochlorothiazide (HYDRODIURIL) 25 MG tablet Take 1 tablet (25 mg total) by mouth daily. 90 tablet 3  . levothyroxine  (SYNTHROID) 50 MCG tablet Take 1 tablet (50 mcg total) by mouth daily. 30 tablet 0  . magnesium oxide (MAG-OX) 400 (241.3 Mg) MG tablet Take 0.5 tablets (200 mg total) by mouth 2 (two) times daily. 60 tablet 0  . metoprolol tartrate (LOPRESSOR) 25 MG tablet TAKE 1 TABLET BY MOUTH TWICE A DAY 60 tablet 11  . Multiple Vitamins-Minerals (MULTIVITAMINS THER. W/MINERALS) TABS tablet Take 1 tablet by mouth daily.    . pantoprazole (PROTONIX) 40 MG tablet Take 1 tablet (40 mg total) by mouth daily. 30 tablet 0  . tamsulosin (FLOMAX) 0.4 MG CAPS capsule Take 1 capsule (0.4 mg total) by mouth at bedtime. 30 capsule 0   No current facility-administered medications for this visit.      Past Medical History:  Diagnosis Date  . Acute on chronic respiratory failure with hypoxia ([place])   . Acute pancreatitis with uninfected necrosis, unspecified   . Chronic atrial fibrillation ([place])   . Essential hypertension, benign   . GERD (gastroesophageal reflux disease)   . Gouty arthropathy, unspecified   . Hypothyroidism   . Melanoma ([place])   . Metabolic encephalopathy   . Other and unspecified hyperlipidemia   . Paroxysmal atrial fibrillation (H[facility])   . Pneumonia due to Pseudomonas aeruginosa ([place])   . Severe sepsis [facility])     Past Surgical History:  Procedure Laterality Date  . ESOPHAGEAL DILATION  01/15/2016   Procedure: ESOPHAGEAL DILATION;  Surgeon: Rogene Houston, MD;  [facility];  Service: Endoscopy;;  . ESOPHAGOGASTRODUODENOSCOPY N/A 01/15/2016   Procedure: ESOPHAGOGASTRODUODENOSCOPY (EGD);  Surgeon:  Rogene Houston, MD;  [facility];  Service: Endoscopy;  Laterality: N/A;  1030  . FRACTURE SURGERY Left    wrist, multiple surgeries  . MELANOMA EXCISION Left 07/2018   groin  . NEUROLYSIS MEDIAN NERVE Right 11/27/2013   Neurolysis Brachial Plexus  . SHOULDER SURGERY Right     Social History   Socioeconomic History  . Marital status: Married    Spouse name: Not on file   . Number of children: Not on file  . Years of education: Not on file  . Highest education level: Not on file  Occupational History    Employer: [employer]    Comment: Dealer  Social Needs  . Financial resource strain: Not on file  . Food insecurity    Worry: Not on file    Inability: Not on file  . Transportation needs    Medical: Not on file    Non-medical: Not on file  Tobacco Use  . Smoking status: Former Smoker    Quit date: 08/02/1996    Years since quitting: 22.9  . Smokeless tobacco: Never Used  . Tobacco comment: quit 20 years ago  Substance and Sexual Activity  . Alcohol use: Yes    Comment: Occasional  . Drug use: No  . Sexual activity: Not Currently  Lifestyle  . Physical activity    Days per week: Not on file    Minutes per session: Not on file  . Stress: Not on file  Relationships  . Social Herbalist on phone: Not on file    Gets together: Not on file    Attends religious service: Not on file    Active member of club or organization: Not on file    Attends meetings of clubs or organizations: Not on file    Relationship status: Not on file  . Intimate partner violence    Fear of current or ex partner: Not on file    Emotionally abused: Not on file    Physically abused: Not on file    Forced sexual activity: Not on file  Other Topics Concern  . Not on file  Social History Narrative   Has no children   Lives in [place]    Family History  Problem Relation Age of Onset  . Breast cancer Mother   . Other Father        unknown death cause  . Diabetes Father     ROS: no fevers or chills, productive cough, hemoptysis, dysphasia, odynophagia, melena, hematochezia, dysuria, hematuria, rash, seizure activity, orthopnea, PND, pedal edema, claudication. Remaining systems are negative.  Physical Exam: Well-developed well-nourished in no acute distress.  Skin is warm and dry.  HEENT is normal.  Neck is supple.  Chest is clear to  auscultation with normal expansion.  Cardiovascular exam is irregular Abdominal exam nontender or distended. No masses palpated. Extremities show no edema. neuro grossly intact  A/P  1 permanent atrial fibrillation-plan to continue Cardizem for rate control.  Continue apixaban.  2 hypertension-blood pressure controlled.  Continue present medical regimen.  3 iliac aneurysm/abdominal aortic aneurysm-patient will need follow-up ultrasound June 2021.  [name], MD

## 2019-07-10 DIAGNOSIS — H2512 Age-related nuclear cataract, left eye: Secondary | ICD-10-CM | POA: Diagnosis not present

## 2019-07-11 DIAGNOSIS — H2512 Age-related nuclear cataract, left eye: Secondary | ICD-10-CM | POA: Diagnosis not present

## 2019-07-12 ENCOUNTER — Other Ambulatory Visit: Payer: Self-pay | Admitting: Physical Medicine and Rehabilitation

## 2019-07-12 NOTE — Telephone Encounter (Signed)
Needs to be refilled and managed by patients primary care provider or cardiologist

## 2019-07-13 ENCOUNTER — Encounter: Payer: Medicare Other | Attending: Registered Nurse | Admitting: Physical Medicine & Rehabilitation

## 2019-07-13 ENCOUNTER — Other Ambulatory Visit: Payer: Self-pay

## 2019-07-13 ENCOUNTER — Encounter: Payer: Self-pay | Admitting: Physical Medicine & Rehabilitation

## 2019-07-13 ENCOUNTER — Ambulatory Visit (INDEPENDENT_AMBULATORY_CARE_PROVIDER_SITE_OTHER): Payer: Medicare Other | Admitting: Cardiology

## 2019-07-13 ENCOUNTER — Encounter: Payer: Self-pay | Admitting: Cardiology

## 2019-07-13 VITALS — BP 132/66 | HR 68 | Ht 72.0 in | Wt 210.0 lb

## 2019-07-13 VITALS — BP 115/74 | HR 73 | Temp 97.7°F | Ht 72.0 in | Wt 211.8 lb

## 2019-07-13 DIAGNOSIS — I10 Essential (primary) hypertension: Secondary | ICD-10-CM

## 2019-07-13 DIAGNOSIS — I714 Abdominal aortic aneurysm, without rupture, unspecified: Secondary | ICD-10-CM

## 2019-07-13 DIAGNOSIS — R5381 Other malaise: Secondary | ICD-10-CM | POA: Diagnosis not present

## 2019-07-13 DIAGNOSIS — E119 Type 2 diabetes mellitus without complications: Secondary | ICD-10-CM | POA: Diagnosis present

## 2019-07-13 DIAGNOSIS — I723 Aneurysm of iliac artery: Secondary | ICD-10-CM

## 2019-07-13 DIAGNOSIS — I480 Paroxysmal atrial fibrillation: Secondary | ICD-10-CM | POA: Diagnosis not present

## 2019-07-13 DIAGNOSIS — I4821 Permanent atrial fibrillation: Secondary | ICD-10-CM

## 2019-07-13 NOTE — Patient Instructions (Signed)
Medication Instructions:  NO CHANGE *If you need a refill on your cardiac medications before your next appointment, please call your pharmacy*  Lab Work: If you have labs (blood work) drawn today and your tests are completely normal, you will receive your results only by: Marland Kitchen MyChart Message (if you have MyChart) OR . A paper copy in the mail If you have any lab test that is abnormal or we need to change your treatment, we will call you to review the results.  Follow-Up: At [facility], you and your health needs are our priority.  As part of our continuing mission to provide you with exceptional heart care, we have created designated Provider Care Teams.  These Care Teams include your primary Cardiologist (physician) and Advanced Practice Providers (APPs -  Physician Assistants and Nurse Practitioners) who all work together to provide you with the care you need, when you need it.  Your next appointment:   12 MONTHS   The format for your next appointment:   Either In Person or Virtual  Provider:   You may see [name], MD or one of the following Advanced Practice Providers on your designated Care Team:    Kerin Ransom, PA-C  [place], [place]  Coletta Memos, [place]

## 2019-07-13 NOTE — Patient Instructions (Signed)
Try to slowly build up walking to 20-25 min per day

## 2019-07-13 NOTE — Progress Notes (Signed)
Subjective:    Patient ID: Andrew Compton, male    DOB: Jan 11, 1951, 68 y.o.   MRN: [id number] 68 year old male with history of T2DM, melanoma, GERD, gout, HTN, brachial plexus injury BUE, PAF- on eliquis who was admitted [facility] on 02/20/19 withsever abdominal pain, nauseas and abdominal distension with CT abdomen revealing.acute pancreatitis. He was hypoxic on admission and continued to worsen requiring intubation as well as levophed due to septic shock. Hospital coursecomplicated by acute respiratory failure with ARDS, severesepsis due to Pseudomonasbacteremiaas well aspseudomonas PNA, A fib with flutterand encephalopathy. He was started on meropenum and pancreatitis treated with conservative care. Follow up MRCP showed cholelithiasis without cholecystitis, thrombosed left portal vein and improvement in pancreatic edema. EEG done due to decrease in LOC showing nonspecific moderate encephalopathy. He required tracheostomy and PEG placement on 08/12due to inability to wean off vent.He was transferred to [facility] on 03/16/19 for further management.  He completed his course of meropenum for recurrent Pseudomonas PNA and was able to tolerate vent wean. Hospital course significant for issues with agitation, code blue 8/25 with weaning attempts, electrolyte abnormalities, hematuria as well as A fib with RVR. BB added and weaned off IV Cardizem but continues to have tachycardia with activity requiring prn IV lopressor. He tolerated capping of trach 9/3, was started on regular diet and decannulated by 9/6. He has had issues with hematuria (pulling on foley) 8/29 and renal ultrasound showed decompressed bladder with thick wall. Foley d/c with attempts at voiding trials but he has had retention requiring I/O caths and recurrent bleeding since 9/6 therefore foley replaced. Follow up renal ultrasound 9/8 showed decompressed bladder with enlarged prostate and normal echogenicity.  Blood thinners have been held on and off due to hematuria----low dose Eliquis resumed on 09/08 and Rocephin added due to concerns of UTI. Therapy has been ongoing and patient continues to be limited by debility affecting ADLs and mobility.  HPI  The patient's hospital chart has been reviewed also his most recent cardiology note F/u on Rehab unit at [facility] x 2 wks Admit date: 04/11/2019 Discharge date: 04/26/2019  Seen by cardiology Dr Stanford Breed today, hospital f/u , hx of Chronic AFib but had RVR while septic.  Has been seeing Dr. Stanford Breed for 6 to 8 years No falls at home Pt back to driving Feels back to normal  Was Mowing lawn post discharge from hospital.  Doing some yard work as well.  Independent with all self-care and mobility and housework Pain Inventory Average Pain 0 Pain Right Now 0 My pain is na  In the last 24 hours, has pain interfered with the following? General activity 0 Relation with others 0 Enjoyment of life 0 What TIME of day is your pain at its worst? na Sleep (in general) Good  Pain is worse with: na Pain improves with: na Relief from Meds: na  Mobility walk without assistance do you drive?  yes  Function retired  Neuro/Psych No problems in this area  Prior Studies Any changes since last visit?  no  Physicians involved in your care Any changes since last visit?  no   Family History  Problem Relation Age of Onset  . Breast cancer Mother   . Other Father        unknown death cause  . Diabetes Father    Social History   Socioeconomic History  . Marital status: Married    Spouse name: Not on file  . Number of children: Not  on file  . Years of education: Not on file  . Highest education level: Not on file  Occupational History    Employer: [employer]    Comment: [employer]  Tobacco Use  . Smoking status: Former Smoker    Quit date: 08/02/1996    Years since quitting: 22.9  . Smokeless tobacco: Never Used  . Tobacco comment:  quit 20 years ago  Substance and Sexual Activity  . Alcohol use: Yes    Comment: Occasional  . Drug use: No  . Sexual activity: Not Currently  Other Topics Concern  . Not on file  Social History Narrative   Has no children   Lives in [place] Determinants of Health   Financial Resource Strain:   . Difficulty of Paying Living Expenses: Not on file  Food Insecurity:   . Worried About Charity fundraiser in the Last Year: Not on file  . Ran Out of Food in the Last Year: Not on file  Transportation Needs:   . Lack of Transportation (Medical): Not on file  . Lack of Transportation (Non-Medical): Not on file  Physical Activity:   . Days of Exercise per Week: Not on file  . Minutes of Exercise per Session: Not on file  Stress:   . Feeling of Stress : Not on file  Social Connections:   . Frequency of Communication with Friends and Family: Not on file  . Frequency of Social Gatherings with Friends and Family: Not on file  . Attends Religious Services: Not on file  . Active Member of Clubs or Organizations: Not on file  . Attends Archivist Meetings: Not on file  . Marital Status: Not on file   Past Surgical History:  Procedure Laterality Date  . ESOPHAGEAL DILATION  01/15/2016   Procedure: ESOPHAGEAL DILATION;  Surgeon: Rogene Houston, MD;  [facility];  Service: Endoscopy;;  . ESOPHAGOGASTRODUODENOSCOPY N/A 01/15/2016   Procedure: ESOPHAGOGASTRODUODENOSCOPY (EGD);  Surgeon: Rogene Houston, MD;  Location[facility];  Service: Endoscopy;  Laterality: N/A;  1030  . FRACTURE SURGERY Left    wrist, multiple surgeries  . MELANOMA EXCISION Left 07/2018   groin  . NEUROLYSIS MEDIAN NERVE Right 11/27/2013   Neurolysis Brachial Plexus  . SHOULDER SURGERY Right    Past Medical History:  Diagnosis Date  . Acute on chronic respiratory failure with hypoxia ([place])   . Acute pancreatitis with uninfected necrosis, unspecified   . Chronic atrial  fibrillation ([place])   . Essential hypertension, benign   . GERD (gastroesophageal reflux disease)   . Gouty arthropathy, unspecified   . Hypothyroidism   . Melanoma ([place])   . Metabolic encephalopathy   . Other and unspecified hyperlipidemia   . Paroxysmal atrial fibrillation (HCC)   . Pneumonia due to Pseudomonas aeruginosa ([place])   . Severe sepsis (HCC)    BP 115/74   Pulse 73   Temp 97.7 F (36.5 C)   Ht 6' (1.829 m)   Wt 211 lb 12.8 oz (96.1 kg)   SpO2 93%   BMI 28.73 kg/m   Opioid Risk Score:   Fall Risk Score:  `1  Depression screen PHQ 2/9  No flowsheet data found.  Review of Systems  All other systems reviewed and are negative.      Objective:   Physical Exam Vitals and nursing note reviewed.  Constitutional:      Appearance: Normal appearance.  HENT:     Head:  Normocephalic and atraumatic.  Eyes:     Extraocular Movements: Extraocular movements intact.     Conjunctiva/sclera: Conjunctivae normal.     Pupils: Pupils are equal, round, and reactive to light.  Cardiovascular:     Rate and Rhythm: Normal rate. Rhythm irregularly irregular.     Heart sounds: No murmur.  Pulmonary:     Effort: Pulmonary effort is normal.     Breath sounds: Normal breath sounds. No decreased breath sounds, wheezing, rhonchi or rales.  Skin:    General: Skin is warm and dry.  Neurological:     General: No focal deficit present.     Mental Status: He is alert and oriented to person, place, and time.  Psychiatric:        Mood and Affect: Mood normal.        Speech: Speech normal.        Behavior: Behavior normal. Behavior is cooperative.        Thought Content: Thought content normal.        Cognition and Memory: Cognition normal.        Judgment: Judgment normal.   Alert and oriented x3        Assessment & Plan:  #1.  Debility and encephalopathy following sepsis.  He is functionally independent once again.  No residual disability but has some fatigue.  Cognition has  improved to baseline We discussed walking for exercise starting at 5 minutes a day and working up to 20 to 25 minutes/day.  We discussed that his endurance may take several months to improve given the duration of his hospitalization. Patient will follow-up with PCP No need for PM&R follow-up. #2.  Atrial fibrillation follow-up with cardiology

## 2019-07-18 ENCOUNTER — Other Ambulatory Visit: Payer: Self-pay

## 2019-07-18 MED ORDER — DILTIAZEM HCL ER COATED BEADS 360 MG PO CP24: ORAL_CAPSULE | ORAL | 0 refills | Status: DC

## 2019-07-31 HISTORY — PX: CHOLECYSTECTOMY: SHX55

## 2019-08-07 ENCOUNTER — Encounter (INDEPENDENT_AMBULATORY_CARE_PROVIDER_SITE_OTHER): Payer: Self-pay | Admitting: Internal Medicine

## 2019-08-07 ENCOUNTER — Other Ambulatory Visit: Payer: Self-pay

## 2019-08-07 ENCOUNTER — Ambulatory Visit (INDEPENDENT_AMBULATORY_CARE_PROVIDER_SITE_OTHER): Payer: Medicare Other | Admitting: Internal Medicine

## 2019-08-07 VITALS — BP 119/77 | HR 86 | Ht 72.0 in | Wt 204.0 lb

## 2019-08-07 DIAGNOSIS — K2270 Barrett's esophagus without dysplasia: Secondary | ICD-10-CM

## 2019-08-07 DIAGNOSIS — K219 Gastro-esophageal reflux disease without esophagitis: Secondary | ICD-10-CM | POA: Diagnosis not present

## 2019-08-07 MED ORDER — PANTOPRAZOLE SODIUM 40 MG PO TBEC: 40.0000 mg | DELAYED_RELEASE_TABLET | Freq: Two times a day (BID) | ORAL | 3 refills | Status: DC

## 2019-08-07 NOTE — Progress Notes (Signed)
Virtual Visit via Telephone Note  Patient has scheduled face-to-face visit today.  Was advised change to virtual/telephone visit because of Covid-19 pandemic.  We both agree. I connected with Andrew Compton on 08/07/19 at 10:58 AM EST by telephone and verified that I am speaking with the correct person using two identifiers.  Location: Patient: home Provider: office   I discussed the limitations, risks, security and privacy concerns of performing an evaluation and management service by telephone and the availability of in person appointments. I also discussed with the patient that there may be a patient responsible charge related to this service. The patient expressed understanding and agreed to proceed.   History of Present Illness:  Patient is 69 year old Caucasian male who has a history of chronic GERD complicated by long segment Barrett's esophagus as well as history of esophageal stricture which was dilated in June 2017.  Patient was last seen 1 year ago and was doing well. Patient says he is doing great with double dose PPI.  He tried to reduce the dose but it did not work.  He states he does not want make any changes at the present time.  While he is on double dose PPI he rarely experiences heartburn or regurgitation.  He denies dysphagia sore throat chronic cough or hoarseness.  He also denies nausea or vomiting.  He has good appetite.  He has lost about 25 pounds since his last visit.  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  Initially he was too sick to have cholecystectomy.  He was hospitalized in [place] in July.  He was then transferred to [facility] for rehabilitation.  He finally went on to have cholecystectomy last week by Dr. Davina Poke of [facility].  He states he required tracheostomy and PEG tube placement during his critical illness.  He states his weight was actually down to 197  pounds but he has gained about 8 pounds.  He denies abdominal pain melena or rectal bleeding.   Observations/Objective:  Patient reported his weight to be 205 pounds. He weighed 229 pounds on 08/08/2018.  Assessment and Plan:  #1.  GERD complicated by long segment Barrett's and esophageal stricture.  Last EGD with ED and biopsy was in June 2017.  Biopsies are negative for dysplasia. He is doing well with antireflux measures and double dose PPI.  He did not do well with single dose PPI and H2B.  Therefore we will continue with present therapy for now.  New prescription for pantoprazole 40 mg twice daily sent to patient's pharmacy for 3 months with 3 refills.  #2.  Recent illness(July 2020) with necrotizing pancreatitis requiring 9-week of hospitalization along with trach and PEG.  He was treated at a hospital in [facility] and eventually transferred to [facility] and then rehab.  Both trach and PEG were removed.  He had cholecystectomy last week by Dr. Davina Poke of [facility].  He appears to be doing well.  #3.  He is average risk for CRC.  Last colonoscopy was in November 2011 with removal of 2 small polyps which were hyperplastic.  Next screening exam due later this year or next.  Follow Up Instructions:  Patient will call if he develops dysphagia or if pantoprazole stops working. Office visit in 1 year. I discussed the assessment and treatment plan with the patient. The patient was provided an opportunity to ask questions and all were answered. The patient agreed with the  plan and demonstrated an understanding of the instructions.   The patient was advised to call back or seek an in-person evaluation if the symptoms worsen or if the condition fails to improve as anticipated.  I provided 12 minutes of non-face-to-face time during this encounter.   Hildred Laser, MD

## 2019-08-07 NOTE — Patient Instructions (Signed)
Patient will call office if he has swallowing difficulty or if pantoprazole stops working.

## 2019-08-11 ENCOUNTER — Other Ambulatory Visit: Payer: Self-pay | Admitting: Cardiology

## 2019-08-11 DIAGNOSIS — K661 Hemoperitoneum: Secondary | ICD-10-CM | POA: Diagnosis not present

## 2019-08-11 DIAGNOSIS — Z880 Allergy status to penicillin: Secondary | ICD-10-CM | POA: Diagnosis not present

## 2019-08-11 DIAGNOSIS — I4891 Unspecified atrial fibrillation: Secondary | ICD-10-CM | POA: Diagnosis not present

## 2019-08-11 DIAGNOSIS — R110 Nausea: Secondary | ICD-10-CM | POA: Diagnosis not present

## 2019-08-11 DIAGNOSIS — N390 Urinary tract infection, site not specified: Secondary | ICD-10-CM | POA: Diagnosis not present

## 2019-08-11 DIAGNOSIS — I5021 Acute systolic (congestive) heart failure: Secondary | ICD-10-CM | POA: Diagnosis not present

## 2019-08-11 DIAGNOSIS — R1012 Left upper quadrant pain: Secondary | ICD-10-CM | POA: Diagnosis not present

## 2019-08-11 DIAGNOSIS — K219 Gastro-esophageal reflux disease without esophagitis: Secondary | ICD-10-CM | POA: Diagnosis present

## 2019-08-11 DIAGNOSIS — I480 Paroxysmal atrial fibrillation: Secondary | ICD-10-CM | POA: Diagnosis not present

## 2019-08-11 DIAGNOSIS — E039 Hypothyroidism, unspecified: Secondary | ICD-10-CM | POA: Diagnosis present

## 2019-08-11 DIAGNOSIS — Z79899 Other long term (current) drug therapy: Secondary | ICD-10-CM | POA: Diagnosis not present

## 2019-08-11 DIAGNOSIS — A419 Sepsis, unspecified organism: Secondary | ICD-10-CM | POA: Diagnosis not present

## 2019-08-11 DIAGNOSIS — N179 Acute kidney failure, unspecified: Secondary | ICD-10-CM | POA: Diagnosis not present

## 2019-08-11 DIAGNOSIS — I110 Hypertensive heart disease with heart failure: Secondary | ICD-10-CM | POA: Diagnosis present

## 2019-08-11 DIAGNOSIS — Z7984 Long term (current) use of oral hypoglycemic drugs: Secondary | ICD-10-CM | POA: Diagnosis not present

## 2019-08-11 DIAGNOSIS — G8918 Other acute postprocedural pain: Secondary | ICD-10-CM | POA: Diagnosis not present

## 2019-08-11 DIAGNOSIS — I959 Hypotension, unspecified: Secondary | ICD-10-CM | POA: Diagnosis not present

## 2019-08-11 DIAGNOSIS — E119 Type 2 diabetes mellitus without complications: Secondary | ICD-10-CM | POA: Diagnosis present

## 2019-08-11 DIAGNOSIS — Z882 Allergy status to sulfonamides status: Secondary | ICD-10-CM | POA: Diagnosis not present

## 2019-08-11 DIAGNOSIS — T45515A Adverse effect of anticoagulants, initial encounter: Secondary | ICD-10-CM | POA: Diagnosis present

## 2019-08-11 DIAGNOSIS — R0602 Shortness of breath: Secondary | ICD-10-CM | POA: Diagnosis not present

## 2019-08-11 DIAGNOSIS — J9601 Acute respiratory failure with hypoxia: Secondary | ICD-10-CM | POA: Diagnosis not present

## 2019-08-11 DIAGNOSIS — B965 Pseudomonas (aeruginosa) (mallei) (pseudomallei) as the cause of diseases classified elsewhere: Secondary | ICD-10-CM | POA: Diagnosis present

## 2019-08-11 DIAGNOSIS — Z888 Allergy status to other drugs, medicaments and biological substances status: Secondary | ICD-10-CM | POA: Diagnosis not present

## 2019-08-11 DIAGNOSIS — Z9049 Acquired absence of other specified parts of digestive tract: Secondary | ICD-10-CM | POA: Diagnosis not present

## 2019-08-11 DIAGNOSIS — Z9889 Other specified postprocedural states: Secondary | ICD-10-CM | POA: Diagnosis not present

## 2019-08-11 DIAGNOSIS — D72828 Other elevated white blood cell count: Secondary | ICD-10-CM | POA: Diagnosis not present

## 2019-08-11 DIAGNOSIS — R0609 Other forms of dyspnea: Secondary | ICD-10-CM | POA: Diagnosis not present

## 2019-08-11 DIAGNOSIS — R1031 Right lower quadrant pain: Secondary | ICD-10-CM | POA: Diagnosis not present

## 2019-08-11 DIAGNOSIS — I482 Chronic atrial fibrillation, unspecified: Secondary | ICD-10-CM | POA: Diagnosis not present

## 2019-08-11 DIAGNOSIS — R1011 Right upper quadrant pain: Secondary | ICD-10-CM | POA: Diagnosis not present

## 2019-08-11 DIAGNOSIS — R935 Abnormal findings on diagnostic imaging of other abdominal regions, including retroperitoneum: Secondary | ICD-10-CM | POA: Diagnosis not present

## 2019-08-11 DIAGNOSIS — Z7901 Long term (current) use of anticoagulants: Secondary | ICD-10-CM | POA: Diagnosis not present

## 2019-08-11 DIAGNOSIS — M109 Gout, unspecified: Secondary | ICD-10-CM | POA: Diagnosis present

## 2019-08-11 DIAGNOSIS — Z1629 Resistance to other single specified antibiotic: Secondary | ICD-10-CM | POA: Diagnosis not present

## 2019-08-11 DIAGNOSIS — N400 Enlarged prostate without lower urinary tract symptoms: Secondary | ICD-10-CM | POA: Diagnosis present

## 2019-08-11 DIAGNOSIS — D62 Acute posthemorrhagic anemia: Secondary | ICD-10-CM | POA: Diagnosis not present

## 2019-08-17 NOTE — Telephone Encounter (Signed)
I don't think he is referring to me. I have never seen this patient. -W

## 2019-08-20 ENCOUNTER — Telehealth: Payer: Self-pay | Admitting: Cardiology

## 2019-08-20 NOTE — Telephone Encounter (Signed)
Routed to MD/RN as FYI 

## 2019-08-20 NOTE — Telephone Encounter (Signed)
Routed to CVRR/PharmD pool - mychart messages concerning same matter have been sent

## 2019-08-20 NOTE — Telephone Encounter (Signed)
New Message:     Pt had to have surgery, he now have questions about his Eliquis.

## 2019-08-20 NOTE — Telephone Encounter (Signed)
Patient off Eliquis for 2 weeks per surgeons instructions (after recent bleeding).   Patient looking for Dr Jacalyn Lefevre input on holding Eliquis for 2 weeks. I talked to him as explained risk vs benefit of Eliquis at this point and okay holding Eliquis as instructed by surgeon.

## 2019-08-21 NOTE — Telephone Encounter (Signed)
Spoke with pt, will get rocords from [facility] in [place] for dr Stanford Breed review. Release given to medical records.

## 2019-08-22 ENCOUNTER — Encounter: Payer: Self-pay | Admitting: *Deleted

## 2019-08-22 DIAGNOSIS — D229 Melanocytic nevi, unspecified: Secondary | ICD-10-CM | POA: Diagnosis not present

## 2019-08-22 DIAGNOSIS — Z85820 Personal history of malignant melanoma of skin: Secondary | ICD-10-CM | POA: Diagnosis not present

## 2019-08-22 DIAGNOSIS — D1801 Hemangioma of skin and subcutaneous tissue: Secondary | ICD-10-CM | POA: Diagnosis not present

## 2019-08-22 DIAGNOSIS — L821 Other seborrheic keratosis: Secondary | ICD-10-CM | POA: Diagnosis not present

## 2019-08-22 DIAGNOSIS — L814 Other melanin hyperpigmentation: Secondary | ICD-10-CM | POA: Diagnosis not present

## 2019-08-22 DIAGNOSIS — L905 Scar conditions and fibrosis of skin: Secondary | ICD-10-CM | POA: Diagnosis not present

## 2019-08-22 DIAGNOSIS — D485 Neoplasm of uncertain behavior of skin: Secondary | ICD-10-CM | POA: Diagnosis not present

## 2019-08-24 DIAGNOSIS — D649 Anemia, unspecified: Secondary | ICD-10-CM | POA: Diagnosis not present

## 2019-08-24 DIAGNOSIS — Z7984 Long term (current) use of oral hypoglycemic drugs: Secondary | ICD-10-CM | POA: Diagnosis not present

## 2019-08-24 DIAGNOSIS — Z8619 Personal history of other infectious and parasitic diseases: Secondary | ICD-10-CM | POA: Diagnosis not present

## 2019-08-24 DIAGNOSIS — I4891 Unspecified atrial fibrillation: Secondary | ICD-10-CM | POA: Diagnosis not present

## 2019-08-24 DIAGNOSIS — R140 Abdominal distension (gaseous): Secondary | ICD-10-CM | POA: Diagnosis not present

## 2019-08-24 DIAGNOSIS — Z7901 Long term (current) use of anticoagulants: Secondary | ICD-10-CM | POA: Diagnosis not present

## 2019-08-24 DIAGNOSIS — E119 Type 2 diabetes mellitus without complications: Secondary | ICD-10-CM | POA: Diagnosis not present

## 2019-08-24 DIAGNOSIS — K661 Hemoperitoneum: Secondary | ICD-10-CM | POA: Diagnosis not present

## 2019-08-29 DIAGNOSIS — R58 Hemorrhage, not elsewhere classified: Secondary | ICD-10-CM | POA: Diagnosis not present

## 2019-09-05 DIAGNOSIS — Z23 Encounter for immunization: Secondary | ICD-10-CM | POA: Diagnosis not present

## 2019-09-25 DIAGNOSIS — Z23 Encounter for immunization: Secondary | ICD-10-CM | POA: Diagnosis not present

## 2019-10-16 DIAGNOSIS — E039 Hypothyroidism, unspecified: Secondary | ICD-10-CM | POA: Diagnosis not present

## 2019-10-16 DIAGNOSIS — Z7901 Long term (current) use of anticoagulants: Secondary | ICD-10-CM | POA: Diagnosis not present

## 2019-10-16 DIAGNOSIS — I480 Paroxysmal atrial fibrillation: Secondary | ICD-10-CM | POA: Diagnosis not present

## 2019-10-16 DIAGNOSIS — I714 Abdominal aortic aneurysm, without rupture: Secondary | ICD-10-CM | POA: Diagnosis not present

## 2019-10-16 DIAGNOSIS — Z0001 Encounter for general adult medical examination with abnormal findings: Secondary | ICD-10-CM | POA: Diagnosis not present

## 2019-10-16 DIAGNOSIS — E119 Type 2 diabetes mellitus without complications: Secondary | ICD-10-CM | POA: Diagnosis not present

## 2019-10-16 DIAGNOSIS — Z8589 Personal history of malignant neoplasm of other organs and systems: Secondary | ICD-10-CM | POA: Diagnosis not present

## 2019-11-03 ENCOUNTER — Other Ambulatory Visit: Payer: Self-pay | Admitting: Cardiology

## 2019-11-03 DIAGNOSIS — I4892 Unspecified atrial flutter: Secondary | ICD-10-CM

## 2019-11-14 DIAGNOSIS — Z85820 Personal history of malignant melanoma of skin: Secondary | ICD-10-CM | POA: Diagnosis not present

## 2019-11-14 DIAGNOSIS — L905 Scar conditions and fibrosis of skin: Secondary | ICD-10-CM | POA: Diagnosis not present

## 2019-11-14 DIAGNOSIS — L821 Other seborrheic keratosis: Secondary | ICD-10-CM | POA: Diagnosis not present

## 2019-11-14 DIAGNOSIS — D1801 Hemangioma of skin and subcutaneous tissue: Secondary | ICD-10-CM | POA: Diagnosis not present

## 2019-11-14 DIAGNOSIS — D229 Melanocytic nevi, unspecified: Secondary | ICD-10-CM | POA: Diagnosis not present

## 2019-11-14 DIAGNOSIS — L989 Disorder of the skin and subcutaneous tissue, unspecified: Secondary | ICD-10-CM | POA: Diagnosis not present

## 2019-11-14 DIAGNOSIS — L820 Inflamed seborrheic keratosis: Secondary | ICD-10-CM | POA: Diagnosis not present

## 2019-11-14 DIAGNOSIS — L814 Other melanin hyperpigmentation: Secondary | ICD-10-CM | POA: Diagnosis not present

## 2019-11-14 DIAGNOSIS — D225 Melanocytic nevi of trunk: Secondary | ICD-10-CM | POA: Diagnosis not present

## 2019-11-14 DIAGNOSIS — D485 Neoplasm of uncertain behavior of skin: Secondary | ICD-10-CM | POA: Diagnosis not present

## 2019-11-21 DIAGNOSIS — D0359 Melanoma in situ of other part of trunk: Secondary | ICD-10-CM | POA: Diagnosis not present

## 2019-11-21 DIAGNOSIS — L989 Disorder of the skin and subcutaneous tissue, unspecified: Secondary | ICD-10-CM | POA: Diagnosis not present

## 2019-12-17 DIAGNOSIS — E119 Type 2 diabetes mellitus without complications: Secondary | ICD-10-CM | POA: Diagnosis not present

## 2019-12-17 DIAGNOSIS — M722 Plantar fascial fibromatosis: Secondary | ICD-10-CM | POA: Diagnosis not present

## 2020-01-15 ENCOUNTER — Other Ambulatory Visit: Payer: Self-pay

## 2020-01-15 ENCOUNTER — Other Ambulatory Visit: Payer: Self-pay | Admitting: Cardiology

## 2020-01-15 ENCOUNTER — Ambulatory Visit (HOSPITAL_COMMUNITY)
Admission: RE | Admit: 2020-01-15 | Discharge: 2020-01-15 | Disposition: A | Discharge status: Home / Self Care | Payer: Medicare Other | Source: Ambulatory Visit | Attending: Cardiology | Admitting: Cardiology

## 2020-01-15 DIAGNOSIS — I714 Abdominal aortic aneurysm, without rupture, unspecified: Secondary | ICD-10-CM

## 2020-01-16 ENCOUNTER — Other Ambulatory Visit: Payer: Self-pay | Admitting: *Deleted

## 2020-01-16 DIAGNOSIS — I714 Abdominal aortic aneurysm, without rupture, unspecified: Secondary | ICD-10-CM

## 2020-01-17 DIAGNOSIS — Z1211 Encounter for screening for malignant neoplasm of colon: Secondary | ICD-10-CM | POA: Diagnosis not present

## 2020-01-17 DIAGNOSIS — E119 Type 2 diabetes mellitus without complications: Secondary | ICD-10-CM | POA: Diagnosis not present

## 2020-01-17 DIAGNOSIS — I4891 Unspecified atrial fibrillation: Secondary | ICD-10-CM | POA: Diagnosis not present

## 2020-01-17 DIAGNOSIS — Z7901 Long term (current) use of anticoagulants: Secondary | ICD-10-CM | POA: Diagnosis not present

## 2020-03-24 DIAGNOSIS — S0990XA Unspecified injury of head, initial encounter: Secondary | ICD-10-CM | POA: Diagnosis not present

## 2020-03-24 DIAGNOSIS — S0102XA Laceration with foreign body of scalp, initial encounter: Secondary | ICD-10-CM | POA: Diagnosis not present

## 2020-03-26 DIAGNOSIS — M25561 Pain in right knee: Secondary | ICD-10-CM | POA: Diagnosis not present

## 2020-03-26 DIAGNOSIS — M109 Gout, unspecified: Secondary | ICD-10-CM | POA: Diagnosis not present

## 2020-04-01 IMAGING — DX PORTABLE CHEST - 1 VIEW
1 series · 1 of 1 positions shown · non-contrast
Comparison: 03/16/2019

CLINICAL DATA: Pneumonia

EXAM:
PORTABLE CHEST 1 VIEW

[chest]
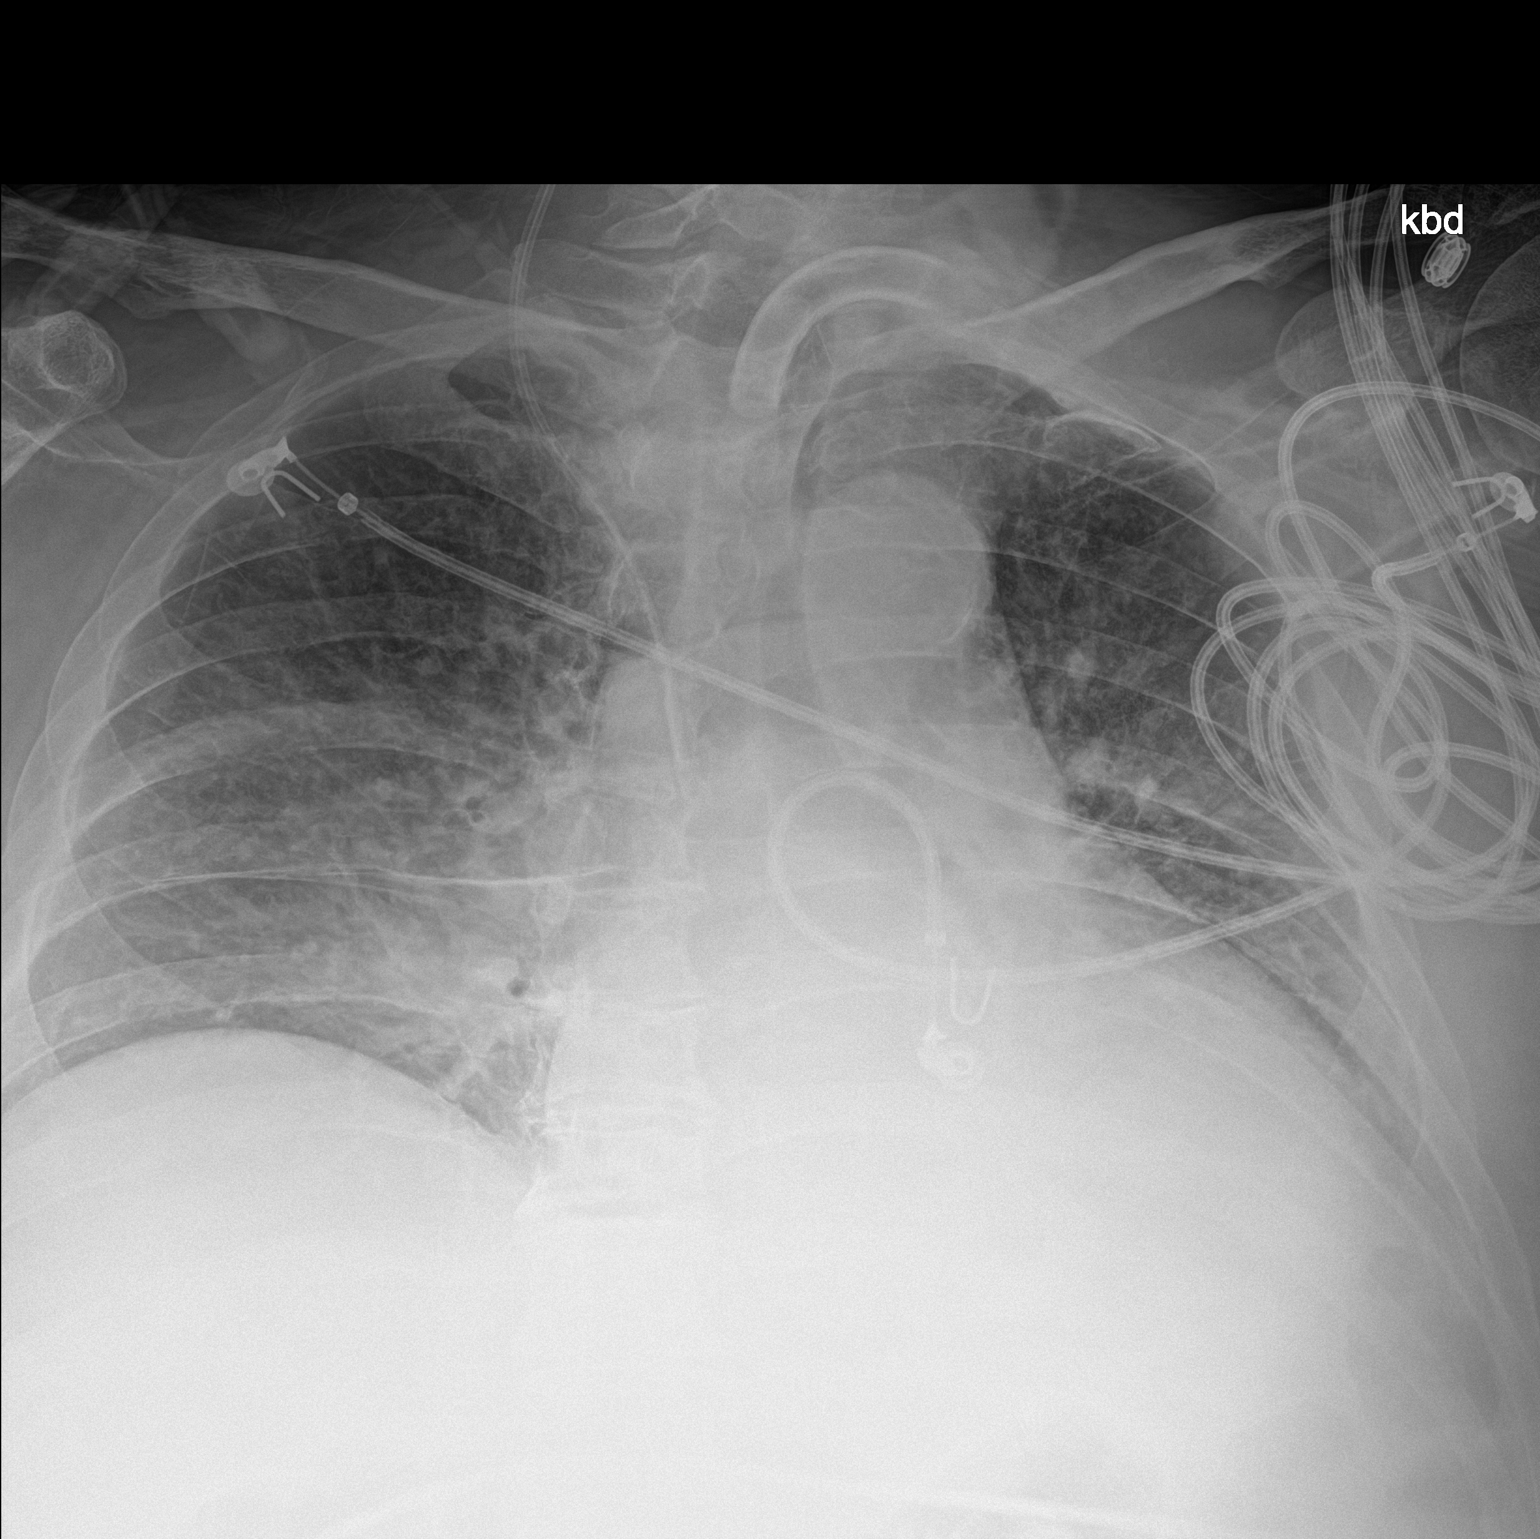

[1 of 1 positions shown; findings below may reference images not displayed]

FINDINGS: Tracheostomy and right internal jugular central line remain in
place, unchanged. Cardiomegaly vascular congestion. Increasing
perihilar and lower lobe opacities concerning for edema. No
effusions or acute bony abnormality.
IMPRESSION: Increasing perihilar and lower lobe opacities concerning for
pulmonary edema.

## 2020-04-02 DIAGNOSIS — L905 Scar conditions and fibrosis of skin: Secondary | ICD-10-CM | POA: Diagnosis not present

## 2020-04-02 DIAGNOSIS — L821 Other seborrheic keratosis: Secondary | ICD-10-CM | POA: Diagnosis not present

## 2020-04-02 DIAGNOSIS — D0361 Melanoma in situ of right upper limb, including shoulder: Secondary | ICD-10-CM | POA: Diagnosis not present

## 2020-04-02 DIAGNOSIS — Z85820 Personal history of malignant melanoma of skin: Secondary | ICD-10-CM | POA: Diagnosis not present

## 2020-04-02 DIAGNOSIS — D485 Neoplasm of uncertain behavior of skin: Secondary | ICD-10-CM | POA: Diagnosis not present

## 2020-04-02 DIAGNOSIS — D1801 Hemangioma of skin and subcutaneous tissue: Secondary | ICD-10-CM | POA: Diagnosis not present

## 2020-04-02 DIAGNOSIS — R238 Other skin changes: Secondary | ICD-10-CM | POA: Diagnosis not present

## 2020-04-02 DIAGNOSIS — C44311 Basal cell carcinoma of skin of nose: Secondary | ICD-10-CM | POA: Diagnosis not present

## 2020-04-22 IMAGING — US US RENAL
1 series · 14 of 25 positions shown · non-contrast
Comparison: Renal ultrasound 03/31/2019.

CLINICAL DATA: 67-year-old male with hematuria.

EXAM:
RENAL / URINARY TRACT ULTRASOUND COMPLETE

[Series 1: us renal · 14 of 39 slices shown]
[im 1/39]
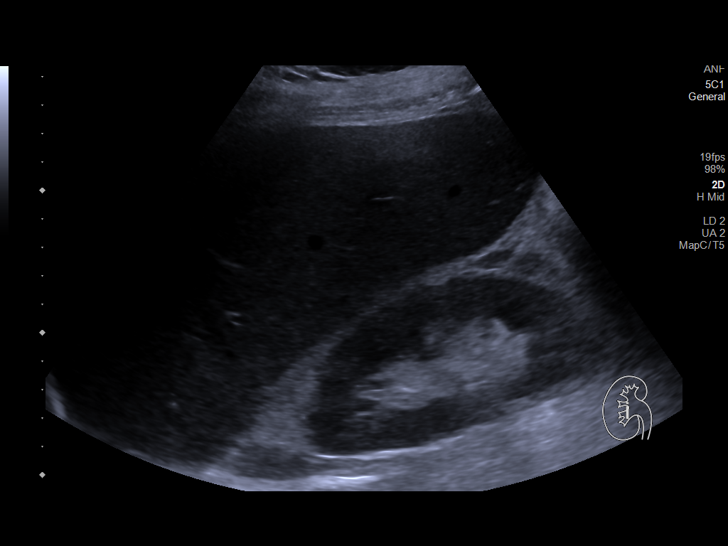
[im 4/39]
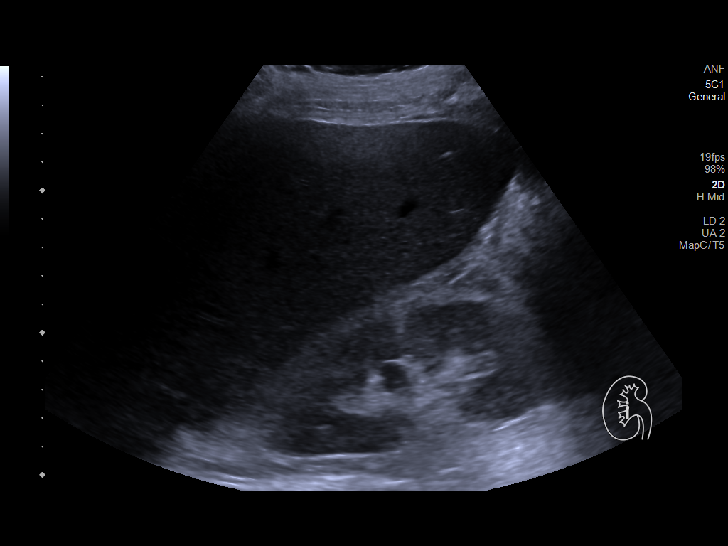
[im 7/39]
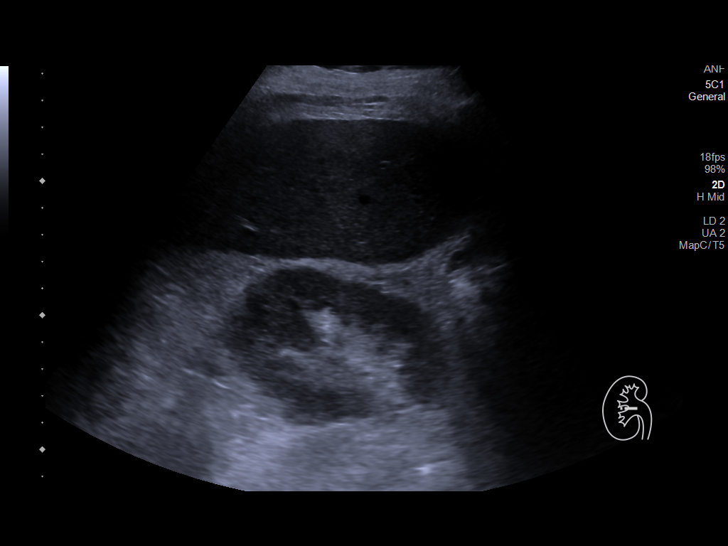
[im 10/39]
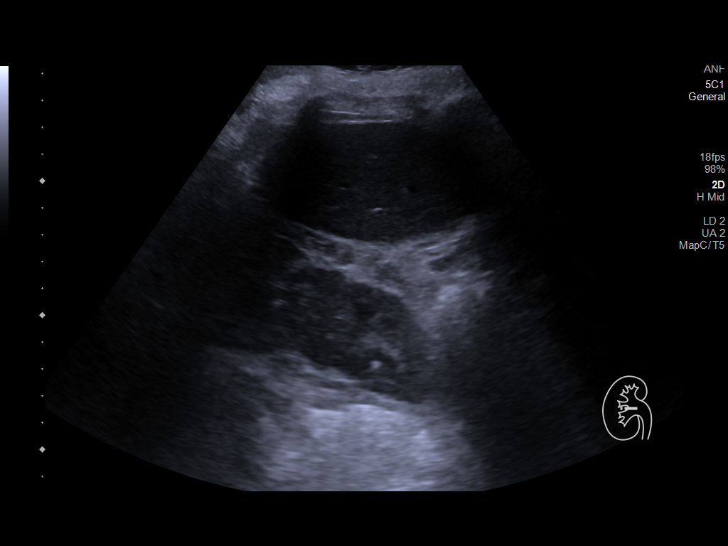
[im 13/39]
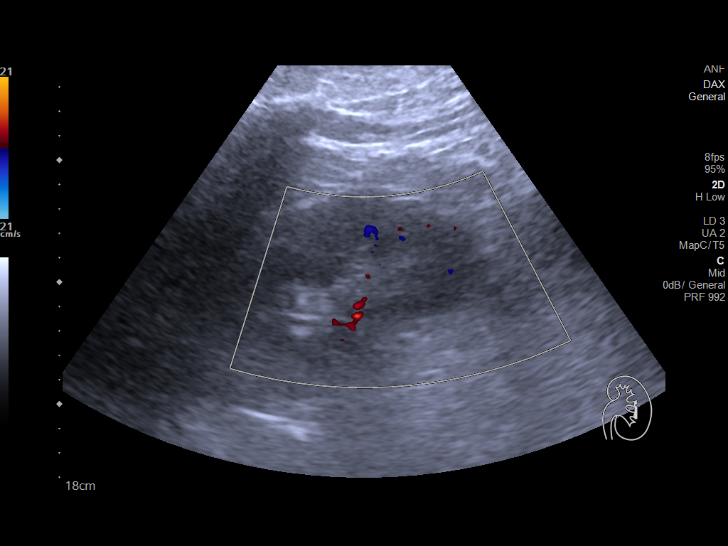
[im 15/39]
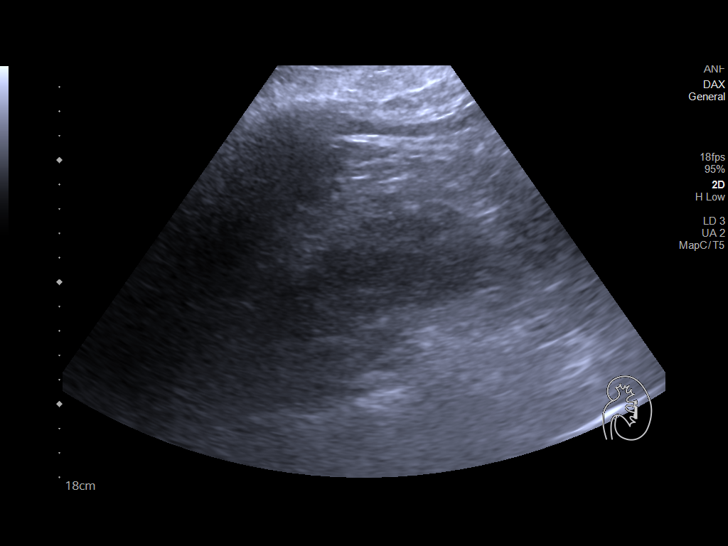
[im 18/39]
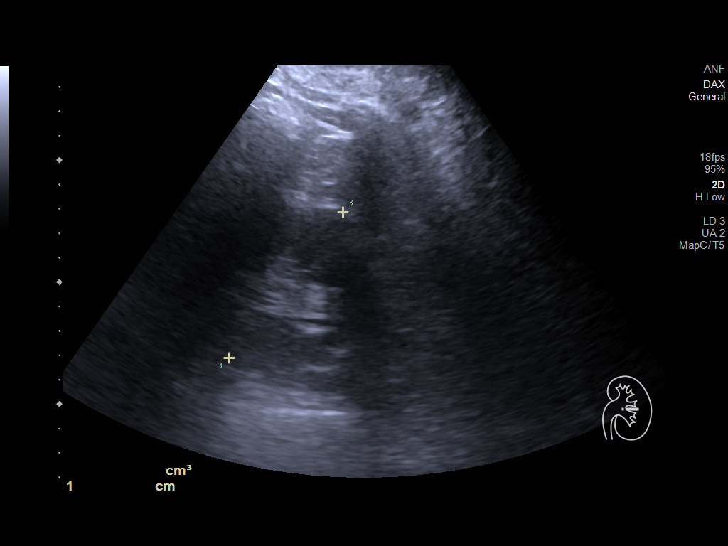
[im 21/39]
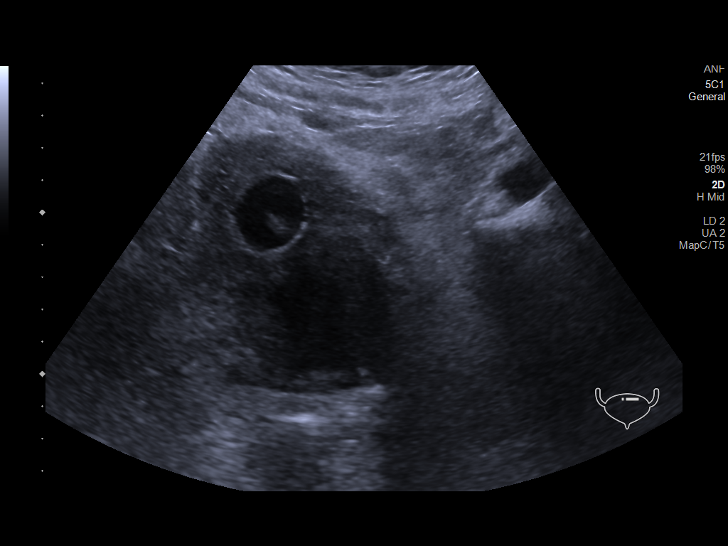
[im 24/39]
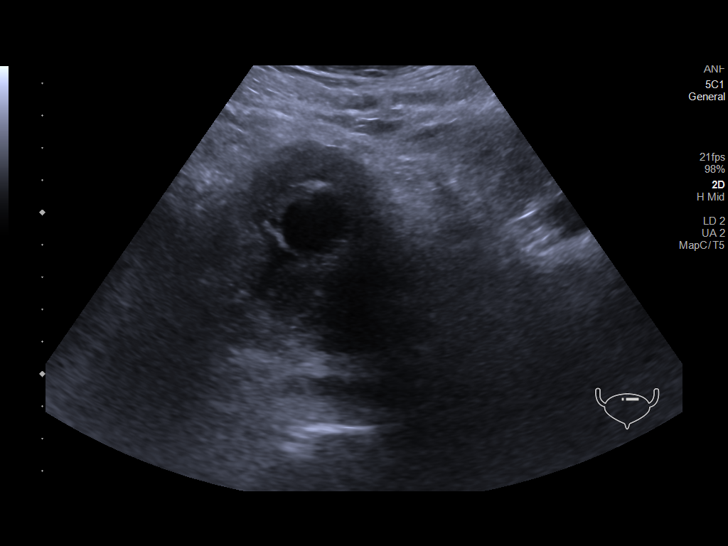
[im 26/39]
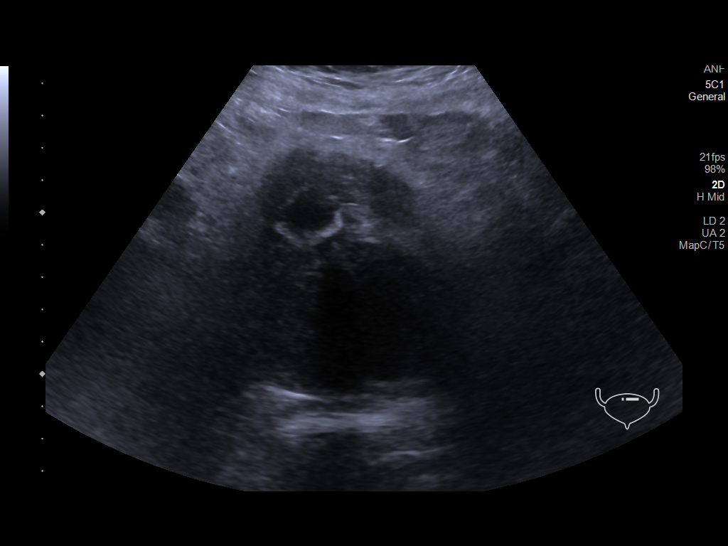
[im 29/39]
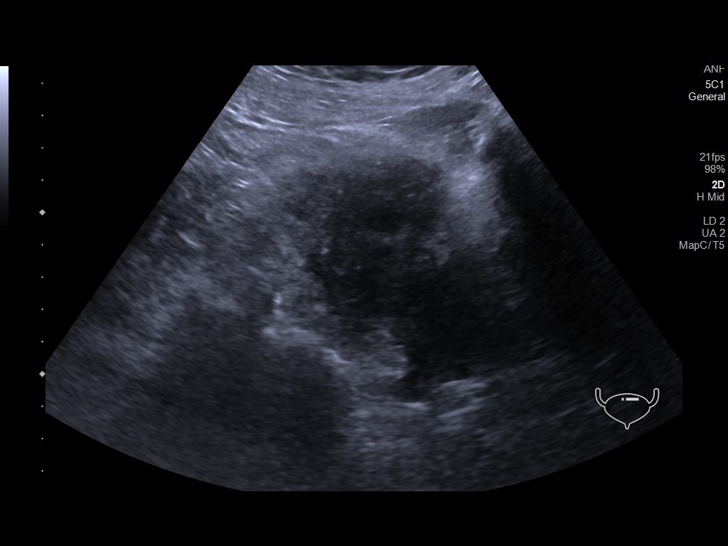
[im 32/39]
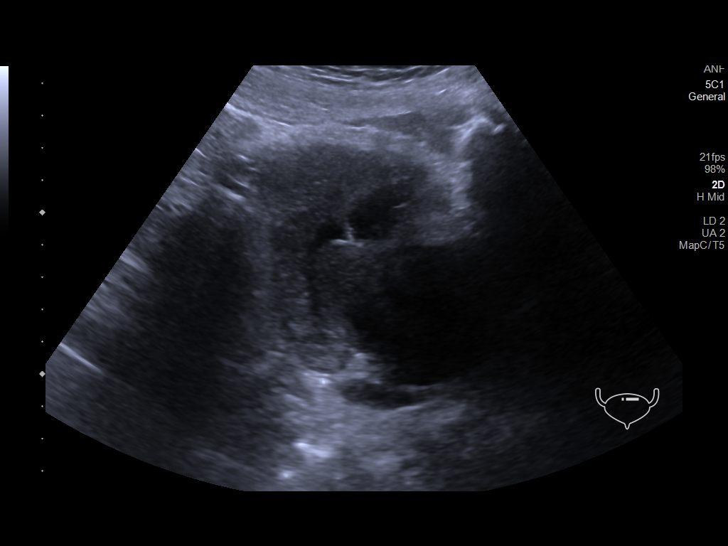
[im 35/39]
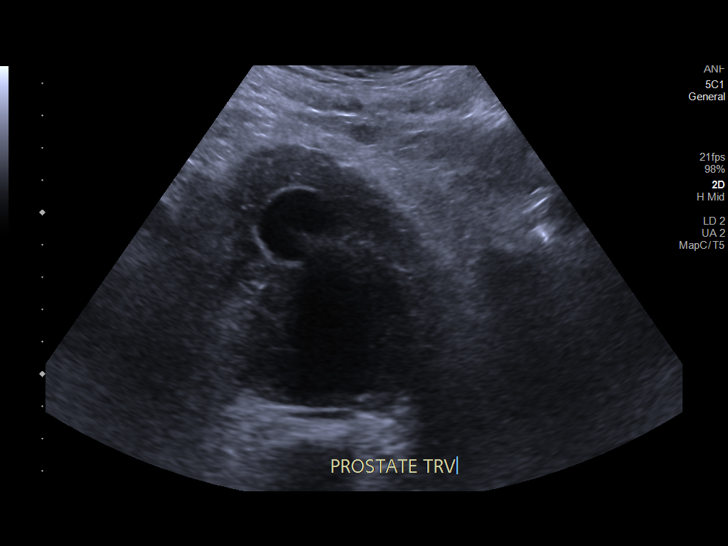
[im 39/39]
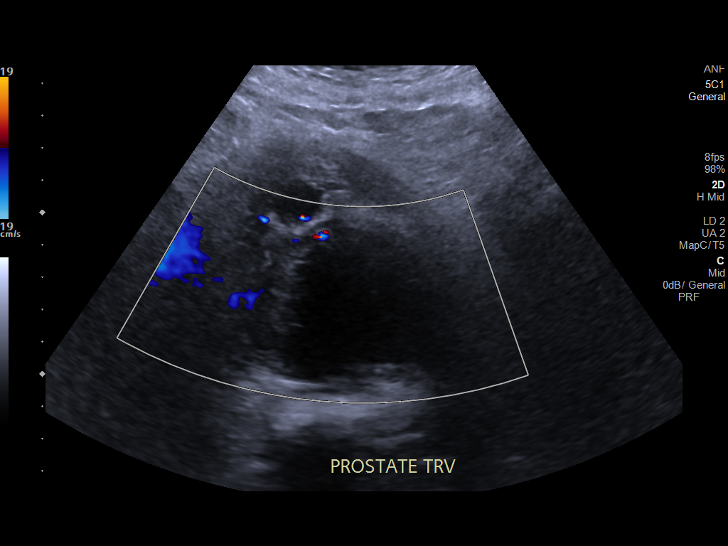

[14 of 25 positions shown; findings below may reference images not displayed]

FINDINGS: Right Kidney:

Renal measurements: 10.5 x 5.6 x 6.4 centimeters = volume: 183 mL .
Echogenicity within normal limits. No mass or hydronephrosis
visualized.

Left Kidney:

Renal measurements: 10.9 x 5.9 x 7.6 centimeters = volume: 255 mL.
Better visualized than on the [place] exam although still mildly
obscured by bowel gas. Cortical thickness and echogenicity appear
normal. No left hydronephrosis or renal mass is evident.

Bladder:

Foley catheter balloon in place (image 22) with decompressed urinary
bladder and generalized bladder wall thickening.

Other findings: Prostate with a diameter of 54 millimeters and
estimated volume of 73 milliliters.
IMPRESSION: 1. Bladder remains decompressed by a Foley catheter and appears
thick walled as before.
2. Negative ultrasound appearance of both kidneys.
3. Prostate enlargement with estimated volume of 73 mL.

## 2020-04-24 DIAGNOSIS — H524 Presbyopia: Secondary | ICD-10-CM | POA: Diagnosis not present

## 2020-04-24 DIAGNOSIS — H52223 Regular astigmatism, bilateral: Secondary | ICD-10-CM | POA: Diagnosis not present

## 2020-04-24 DIAGNOSIS — Z961 Presence of intraocular lens: Secondary | ICD-10-CM | POA: Diagnosis not present

## 2020-04-24 DIAGNOSIS — H5213 Myopia, bilateral: Secondary | ICD-10-CM | POA: Diagnosis not present

## 2020-04-28 DIAGNOSIS — D0361 Melanoma in situ of right upper limb, including shoulder: Secondary | ICD-10-CM | POA: Diagnosis not present

## 2020-04-28 DIAGNOSIS — L905 Scar conditions and fibrosis of skin: Secondary | ICD-10-CM | POA: Diagnosis not present

## 2020-05-20 DIAGNOSIS — C44311 Basal cell carcinoma of skin of nose: Secondary | ICD-10-CM | POA: Diagnosis not present

## 2020-05-26 DIAGNOSIS — Z23 Encounter for immunization: Secondary | ICD-10-CM | POA: Diagnosis not present

## 2020-06-19 DIAGNOSIS — Z23 Encounter for immunization: Secondary | ICD-10-CM | POA: Diagnosis not present

## 2020-07-09 DIAGNOSIS — D225 Melanocytic nevi of trunk: Secondary | ICD-10-CM | POA: Diagnosis not present

## 2020-07-09 DIAGNOSIS — L821 Other seborrheic keratosis: Secondary | ICD-10-CM | POA: Diagnosis not present

## 2020-07-09 DIAGNOSIS — D485 Neoplasm of uncertain behavior of skin: Secondary | ICD-10-CM | POA: Diagnosis not present

## 2020-07-09 DIAGNOSIS — D1801 Hemangioma of skin and subcutaneous tissue: Secondary | ICD-10-CM | POA: Diagnosis not present

## 2020-07-09 DIAGNOSIS — Z85828 Personal history of other malignant neoplasm of skin: Secondary | ICD-10-CM | POA: Diagnosis not present

## 2020-07-09 DIAGNOSIS — D0362 Melanoma in situ of left upper limb, including shoulder: Secondary | ICD-10-CM | POA: Diagnosis not present

## 2020-07-09 DIAGNOSIS — Z85820 Personal history of malignant melanoma of skin: Secondary | ICD-10-CM | POA: Diagnosis not present

## 2020-07-09 DIAGNOSIS — L905 Scar conditions and fibrosis of skin: Secondary | ICD-10-CM | POA: Diagnosis not present

## 2020-07-09 DIAGNOSIS — L814 Other melanin hyperpigmentation: Secondary | ICD-10-CM | POA: Diagnosis not present

## 2020-07-11 DIAGNOSIS — D0359 Melanoma in situ of other part of trunk: Secondary | ICD-10-CM | POA: Diagnosis not present

## 2020-07-11 DIAGNOSIS — D235 Other benign neoplasm of skin of trunk: Secondary | ICD-10-CM | POA: Diagnosis not present

## 2020-07-21 DIAGNOSIS — D0359 Melanoma in situ of other part of trunk: Secondary | ICD-10-CM | POA: Diagnosis not present

## 2020-07-21 DIAGNOSIS — L905 Scar conditions and fibrosis of skin: Secondary | ICD-10-CM | POA: Diagnosis not present

## 2020-08-05 NOTE — Progress Notes (Unsigned)
Virtual Visit via Video Note   This visit type was conducted due to national recommendations for restrictions regarding the COVID-19 Pandemic (e.g. social distancing) in an effort to limit this patient's exposure and mitigate transmission in our community.  Due to his co-morbid illnesses, this patient is at least at moderate risk for complications without adequate follow up.  This format is felt to be most appropriate for this patient at this time.  All issues noted in this document were discussed and addressed.  A limited physical exam was performed with this format.  Please refer to the patient's chart for his consent to telehealth for [facility].      Date:  08/19/2020   ID:  Camillia Herter, DOB 1950/12/14, MRN [id number]  Patient Location:Home Provider Location: Home  PCP:  Emelda Fear, DO  Cardiologist:  Dr Stanford Breed  Evaluation Performed:  Follow-Up Visit  Chief Complaint:  FU atrial fibrillation  History of Present Illness:    HPI: Fu atrial fibrillation. 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 He then developed sepsis. During the admission he was found to have recurrent paroxysmal atrial fibrillation. Patient was treated with Cardizem and metoprolol for rate control. He was placed on apixaban. Monitor 3/15 showed sinus with pacs and PVCs. Echocardiogram June 2020 showed normal LV function, mild left ventricular hypertrophy, moderate left atrial enlargement and mildly dilated aortic root measuring 42 mm. Patient was admitted July 2020 with pancreatitis.  He developed septic shock with Pseudomonas bacteremia, ARDS, portal vein thrombosis and ultimately required tracheostomy and PEG placement. He was noted to have recurrent atrial fibrillation/flutter which was treated with rate control and anticoagulation.  Monitor December 2020 showed atrial fibrillation with PVCs or aberrantly conducted beats, rate controlled. Echocardiogram  in [facility] January 2021 interpreted as ejection fraction 40%, right ventricular enlargement, mild mitral regurgitation and moderate tricuspid regurgitation.  This was apparently performed in the setting of sepsis.  Abdominal ultrasound June 2021 showed right common iliac at 2.4 cm and left common iliac at 2.1 cm.  The aorta measured 3.4 cm.  Since last seen, the patient denies any dyspnea on exertion, orthopnea, PND, pedal edema, palpitations, syncope or chest pain.    The patient does not have symptoms concerning for COVID-19 infection (fever, chills, cough, or new shortness of breath).    Past Medical History:  Diagnosis Date  . Acute on chronic respiratory failure with hypoxia ([place])   . Acute pancreatitis with uninfected necrosis, unspecified   . Chronic atrial fibrillation ([place])   . Essential hypertension, benign   . GERD (gastroesophageal reflux disease)   . Gouty arthropathy, unspecified   . Hypothyroidism   . Melanoma ([place])   . Metabolic encephalopathy   . Other and unspecified hyperlipidemia   . Paroxysmal atrial fibrillation (HCC)   . Pneumonia due to Pseudomonas aeruginosa ([place])   . Severe sepsis [facility])    Past Surgical History:  Procedure Laterality Date  . CHOLECYSTECTOMY  07/31/2019   [place]  01/15/2016   Procedure: ESOPHAGEAL DILATION;  Surgeon: Rogene Houston, MD;  [facility];  Service: Endoscopy;;  . ESOPHAGOGASTRODUODENOSCOPY N/A 01/15/2016   Procedure: ESOPHAGOGASTRODUODENOSCOPY (EGD);  Surgeon: Rogene Houston, MD;  [facility];  Service: Endoscopy;  Laterality: N/A;  1030  . FRACTURE SURGERY Left    wrist, multiple surgeries  . MELANOMA EXCISION Left 07/2018   groin  . NEUROLYSIS MEDIAN NERVE Right 11/27/2013  Neurolysis Brachial Plexus  . SHOULDER SURGERY Right      Current Meds  Medication Sig  . diclofenac Sodium (VOLTAREN) 1 % GEL APPLY 2 GRAMS TO AFFECTED AREA 4 TIMES A DAY  .  diltiazem (CARDIZEM CD) 360 MG 24 hr capsule TAKE 1 CAPSULE BY MOUTH EVERY DAY  . ELIQUIS 5 MG TABS tablet TAKE 1 TABLET TWICE A DAY  . fluticasone (FLONASE) 50 MCG/ACT nasal spray   . levothyroxine (SYNTHROID) 50 MCG tablet Take 1 tablet (50 mcg total) by mouth daily.  . pantoprazole (PROTONIX) 40 MG tablet Take 1 tablet (40 mg total) by mouth 2 (two) times daily before a meal.  . tamsulosin (FLOMAX) 0.4 MG CAPS capsule Take 1 capsule (0.4 mg total) by mouth at bedtime.     Allergies:   Azithromycin, Penicillins, Levophed [norepinephrine bitartrate], and Sulfa antibiotics   Social History   Tobacco Use  . Smoking status: Former Smoker    Quit date: 08/02/1996    Years since quitting: 24.0  . Smokeless tobacco: Never Used  . Tobacco comment: quit 20 years ago  Vaping Use  . Vaping Use: Never used  Substance Use Topics  . Alcohol use: Yes    Comment: Occasional  . Drug use: No     Family Hx: The patient's family history includes Breast cancer in his mother; Diabetes in his father; Other in his father.  ROS:   Please see the history of present illness.    No Fever, chills  or productive cough All other systems reviewed and are negative.  Wt Readings from Last 3 Encounters:  08/19/20 207 lb (93.9 kg)  08/12/20 215 lb 4.8 oz (97.7 kg)  08/07/19 204 lb (92.5 kg)     Objective:    Vital Signs:  Ht 6' (1.829 m)   Wt 207 lb (93.9 kg)   BMI 28.07 kg/m    VITAL SIGNS:  reviewed NAD Answers questions appropriately Normal affect Remainder of physical examination not performed (telehealth visit; coronavirus pandemic)  ASSESSMENT & PLAN:    1 permanent atrial fibrillation-continue Cardizem at present dose for rate control.  Continue apixaban.  2 iliac aneurysm/abdominal aortic aneurysm-plan follow-up ultrasound June 2022.  3 cardiomyopathy-based on outside report patient had reduced LV function in January 2021 in the setting of sepsis.  We will plan repeat study.  4  hypertension-blood pressure controlled.  Continue present medications.   COVID-19 Education: The importance of social distancing was discussed today.  Time:   Today, I have spent 15 minutes with the patient with telehealth technology discussing the above problems.     Medication Adjustments/Labs and Tests Ordered: Current medicines are reviewed at length with the patient today.  Concerns regarding medicines are outlined above.   Tests Ordered: No orders of the defined types were placed in this encounter.   Medication Changes: No orders of the defined types were placed in this encounter.   Follow Up:  In Person in 6 month(s)  Signed, Olga Millers, MD  08/19/2020 8:37 AM    [facility]

## 2020-08-12 ENCOUNTER — Ambulatory Visit (INDEPENDENT_AMBULATORY_CARE_PROVIDER_SITE_OTHER): Payer: Medicare Other | Admitting: Internal Medicine

## 2020-08-12 ENCOUNTER — Other Ambulatory Visit: Payer: Self-pay

## 2020-08-12 ENCOUNTER — Encounter (INDEPENDENT_AMBULATORY_CARE_PROVIDER_SITE_OTHER): Payer: Self-pay | Admitting: Internal Medicine

## 2020-08-12 VITALS — BP 128/74 | HR 84 | Temp 98.6°F | Ht 72.0 in | Wt 215.3 lb

## 2020-08-12 DIAGNOSIS — K2270 Barrett's esophagus without dysplasia: Secondary | ICD-10-CM | POA: Diagnosis not present

## 2020-08-12 DIAGNOSIS — K219 Gastro-esophageal reflux disease without esophagitis: Secondary | ICD-10-CM

## 2020-08-12 MED ORDER — PANTOPRAZOLE SODIUM 40 MG PO TBEC: 40.0000 mg | DELAYED_RELEASE_TABLET | Freq: Two times a day (BID) | ORAL | 3 refills | Status: DC

## 2020-08-12 NOTE — Progress Notes (Unsigned)
Presenting complaint;  Follow-up for complicated GERD.  Database and subjective:  Patient is 70 year old Caucasian male who has chronic GERD complicated by distal esophageal stricture as well as long segment Barrett's esophagus.  Barrett's esophagus was diagnosed back in November 2011 when he presented with dysphagia.  His esophagus was dilated.  His most recent EGD was in June 2017 and esophageal biopsies revealed Barrett's esophagus without dysplasia.  Biopsy of focal area at distal esophagus with erosion reveals Barrett's with reactive changes but no dysplasia. He also underwent screening colonoscopy in November 2011 revealing external hemorrhoids or 2 small polyps and these were hyperplastic. Patient states he is doing well as long as he takes PPI twice daily.  He rarely has heartburn.  He denies dysphagia hoarseness sore throat or chronic cough.  His appetite is very good.  He has gained 11 pounds since his last visit.  He had slacked off in physical activity but now he is walking at least 4 times a week weather permitting. Family history is negative for CRC.   Current Medications: Outpatient Encounter Medications as of 08/12/2020  Medication Sig  . acetaminophen (TYLENOL) 325 MG tablet Take 2 tablets (650 mg total) by mouth every 6 (six) hours as needed for mild pain. (Patient taking differently: Take 650 mg by mouth 3 (three) times daily as needed for mild pain or headache.)  . diclofenac Sodium (VOLTAREN) 1 % GEL APPLY 2 GRAMS TO AFFECTED AREA 4 TIMES A DAY  . diltiazem (CARDIZEM CD) 360 MG 24 hr capsule TAKE 1 CAPSULE BY MOUTH EVERY DAY  . ELIQUIS 5 MG TABS tablet TAKE 1 TABLET TWICE A DAY  . hydrochlorothiazide (HYDRODIURIL) 25 MG tablet Take 1 tablet (25 mg total) by mouth daily.  Marland Kitchen levothyroxine (SYNTHROID) 50 MCG tablet Take 1 tablet (50 mcg total) by mouth daily.  . pantoprazole (PROTONIX) 40 MG tablet Take 1 tablet (40 mg total) by mouth 2 (two) times daily before a meal.  .  tamsulosin (FLOMAX) 0.4 MG CAPS capsule Take 1 capsule (0.4 mg total) by mouth at bedtime.  . metFORMIN (GLUMETZA) 500 MG (MOD) 24 hr tablet Take 1 tablet by mouth daily.  . metoprolol tartrate (LOPRESSOR) 25 MG tablet TAKE 1 TABLET BY MOUTH TWICE A DAY  . [DISCONTINUED] Prednisolon-Gatiflox-Bromfenac 1-0.5-0.075 % SOLN instill 1 drop into surgical eye starting 3 days before surgery (Patient not taking: Reported on 08/12/2020)   No facility-administered encounter medications on file as of 08/12/2020.     Objective: Blood pressure 128/74, pulse 84, temperature 98.6 F (37 C), temperature source Oral, height 6' (1.829 m), weight 215 lb 4.8 oz (97.7 kg). Patient is alert and in no acute distress. He is wearing a mask. Conjunctiva is pink. Sclera is nonicteric Oropharyngeal mucosa is normal. No neck masses or thyromegaly noted. He has scar overlying right clavicle and another scar in pectoral area below the right clavicle. Cardiac exam with regular rhythm normal S1 and S2. No murmur or gallop noted. Lungs are clear to auscultation. Abdomen is soft and nontender without organomegaly or masses. No LE edema or clubbing noted.   Assessment:  #1.  Chronic GERD complicated by esophageal stricture and long segment Barrett's esophagus.  His last EGD was in June 2017.  Heartburn is well controlled with with double dose PPI.  Prior attempts at reducing the PPI dose have failed.  Therefore will continue with current dose.  I believe benefit outweighs risk.  #2.  Patient is average risk for colorectal carcinoma.  Last  colonoscopy was in November 2011 with removal of 2 small hyperplastic polyps.  We will schedule him for colonoscopy later this year.  Plan:  New prescription for pantoprazole 40 mg p.o. twice daily 90-day supply with 3 refills sent to patient's pharmacy. We will schedule patient for average risk screening colonoscopy and surveillance esophagogastroduodenoscopy in June or July this  year. He will need to be off Eliquis for 2 days.  We will confirm with his cardiologist before anticoagulant interrupted. Office visit in 1 year.

## 2020-08-12 NOTE — Patient Instructions (Signed)
Esophagogastroduodenoscopy and colonoscopy to be scheduled in June or July, 2022

## 2020-08-18 ENCOUNTER — Encounter: Payer: Self-pay | Admitting: *Deleted

## 2020-08-19 ENCOUNTER — Telehealth (INDEPENDENT_AMBULATORY_CARE_PROVIDER_SITE_OTHER): Payer: Medicare Other | Admitting: Cardiology

## 2020-08-19 ENCOUNTER — Telehealth: Payer: Self-pay | Admitting: Cardiology

## 2020-08-19 ENCOUNTER — Encounter: Payer: Self-pay | Admitting: Cardiology

## 2020-08-19 VITALS — Ht 72.0 in | Wt 207.0 lb

## 2020-08-19 DIAGNOSIS — I10 Essential (primary) hypertension: Secondary | ICD-10-CM

## 2020-08-19 DIAGNOSIS — I4821 Permanent atrial fibrillation: Secondary | ICD-10-CM

## 2020-08-19 DIAGNOSIS — I714 Abdominal aortic aneurysm, without rupture, unspecified: Secondary | ICD-10-CM

## 2020-08-19 DIAGNOSIS — I723 Aneurysm of iliac artery: Secondary | ICD-10-CM

## 2020-08-19 DIAGNOSIS — I429 Cardiomyopathy, unspecified: Secondary | ICD-10-CM

## 2020-08-19 MED ORDER — APIXABAN 5 MG PO TABS: 5.0000 mg | ORAL_TABLET | Freq: Two times a day (BID) | ORAL | 3 refills | Status: DC

## 2020-08-19 MED ORDER — DILTIAZEM HCL ER COATED BEADS 360 MG PO CP24: 360.0000 mg | ORAL_CAPSULE | Freq: Every day | ORAL | 3 refills | Status: DC

## 2020-08-19 NOTE — Telephone Encounter (Signed)
Spoke with patient regarding scheduled appointments for Echo and AAA doppler---Echo scheduled 09/11/20 at 10:15 am at [facility], [place] scheduled 01/15/21 at 10:00 am at [facility] and follow up with Dr. Stanford Breed scheduled 01/15/21 at 11:00 am.  Will mail information to patient

## 2020-08-19 NOTE — Patient Instructions (Signed)
Medication Instructions:  Your physician recommends that you continue on your current medications as directed. Please refer to the Current Medication list given to you today.  *If you need a refill on your cardiac medications before your next appointment, please call your pharmacy*  Testing/Procedures: Your physician has requested that you have an echocardiogram. Echocardiography is a painless test that uses sound waves to create images of your heart. It provides your doctor with information about the size and shape of your heart and how well your heart's chambers and valves are working. This procedure takes approximately one hour. There are no restrictions for this procedure.  Your physician has requested that you have an abdominal aorta duplex in June 2022. During this test, an ultrasound is used to evaluate the aorta. Allow 30 minutes for this exam. Do not eat after midnight the day before and avoid carbonated beverages   Follow-Up: At [facility], you and your health needs are our priority.  As part of our continuing mission to provide you with exceptional heart care, we have created designated Provider Care Teams.  These Care Teams include your primary Cardiologist (physician) and Advanced Practice Providers (APPs -  Physician Assistants and Nurse Practitioners) who all work together to provide you with the care you need, when you need it.  We recommend signing up for the patient portal called "MyChart".  Sign up information is provided on this After Visit Summary.  MyChart is used to connect with patients for Virtual Visits (Telemedicine).  Patients are able to view lab/test results, encounter notes, upcoming appointments, etc.  Non-urgent messages can be sent to your provider as well.   To learn more about what you can do with MyChart, go to [email or web address].    Your next appointment:   6 month(s)  The format for your next appointment:   In Person  Provider:   [name],  MD

## 2020-08-21 MED ORDER — DILTIAZEM HCL ER BEADS 360 MG PO CP24: 360.0000 mg | ORAL_CAPSULE | Freq: Every day | ORAL | 1 refills | Status: DC

## 2020-08-22 MED ORDER — DILTIAZEM HCL ER BEADS 360 MG PO CP24: 360.0000 mg | ORAL_CAPSULE | Freq: Every day | ORAL | 1 refills | Status: DC

## 2020-08-22 NOTE — Addendum Note (Signed)
Addended by: Sherrie Mustache on: 08/22/2020 08:24 AM   Modules accepted: Orders

## 2020-09-11 ENCOUNTER — Other Ambulatory Visit: Payer: Self-pay

## 2020-09-11 ENCOUNTER — Ambulatory Visit (HOSPITAL_COMMUNITY): Payer: Medicare Other | Attending: Cardiology

## 2020-09-11 DIAGNOSIS — I4821 Permanent atrial fibrillation: Secondary | ICD-10-CM | POA: Insufficient documentation

## 2020-09-11 DIAGNOSIS — I429 Cardiomyopathy, unspecified: Secondary | ICD-10-CM | POA: Insufficient documentation

## 2020-09-11 DIAGNOSIS — I10 Essential (primary) hypertension: Secondary | ICD-10-CM | POA: Diagnosis present

## 2020-09-11 LAB — ECHOCARDIOGRAM COMPLETE: S' Lateral: 2.4 cm

## 2020-09-11 MED ORDER — PERFLUTREN LIPID MICROSPHERE
1.0000 mL | INTRAVENOUS | Status: AC | PRN
  Administered 2020-09-11: 2 mL via INTRAVENOUS

## 2020-09-12 ENCOUNTER — Encounter: Payer: Self-pay | Admitting: Cardiology

## 2020-09-15 ENCOUNTER — Telehealth: Payer: Self-pay | Admitting: *Deleted

## 2020-09-15 DIAGNOSIS — I712 Thoracic aortic aneurysm, without rupture, unspecified: Secondary | ICD-10-CM

## 2020-09-15 NOTE — Telephone Encounter (Signed)
-----   Message from Lelon Perla, MD sent at 09/12/2020  7:19 AM EST ----- LV function normal; schedule CTA to size thoracic aorta [name]

## 2020-09-15 NOTE — Telephone Encounter (Signed)
Spoke with pt, Aware of dr Jacalyn Lefevre recommendations. Order placed for CTA to be done at [facility].

## 2020-09-16 ENCOUNTER — Other Ambulatory Visit: Payer: Self-pay | Admitting: *Deleted

## 2020-09-16 ENCOUNTER — Telehealth: Payer: Self-pay | Admitting: Cardiology

## 2020-09-16 DIAGNOSIS — I712 Thoracic aortic aneurysm, without rupture, unspecified: Secondary | ICD-10-CM

## 2020-09-16 NOTE — Progress Notes (Signed)
bmp 

## 2020-09-16 NOTE — Telephone Encounter (Signed)
Spoke with patient regarding appointment 10/07/20 at 10:00 am for the CTA chest/aorta ordered by Dr. Stanford Breed.  Arrival time is 9:45 am [address] ([place] [facility]) for check in---liquids only 4 hours prior to the study.  Patient aware to have lab work done 1 week prior to the study.

## 2020-09-30 ENCOUNTER — Telehealth: Payer: Self-pay | Admitting: *Deleted

## 2020-09-30 DIAGNOSIS — E876 Hypokalemia: Secondary | ICD-10-CM

## 2020-09-30 LAB — BASIC METABOLIC PANEL
BUN/Creatinine Ratio: 13 (ref 10–24)
BUN: 13 mg/dL (ref 8–27)
CO2: 28 mmol/L (ref 20–29)
Calcium: 9.7 mg/dL (ref 8.6–10.2)
Chloride: 95 mmol/L — ABNORMAL LOW (ref 96–106)
Creatinine, Ser: 1.01 mg/dL (ref 0.76–1.27)
Glucose: 119 mg/dL — ABNORMAL HIGH (ref 65–99)
Potassium: 3.4 mmol/L — ABNORMAL LOW (ref 3.5–5.2)
Sodium: 137 mmol/L (ref 134–144)
eGFR: 80 mL/min/{1.73_m2} (ref >59)

## 2020-09-30 MED ORDER — POTASSIUM CHLORIDE ER 10 MEQ PO TBCR: 20.0000 meq | EXTENDED_RELEASE_TABLET | Freq: Every day | ORAL | 3 refills | Status: DC

## 2020-09-30 NOTE — Telephone Encounter (Signed)
Spoke with pt, Aware of dr crenshaw's recommendations. New script sent to the pharmacy and Lab orders mailed to the pt  

## 2020-09-30 NOTE — Telephone Encounter (Signed)
-----   Message from Lelon Perla, MD sent at 09/30/2020  7:05 AM EST ----- Kdur 20 meq daily; bmet one week [name]

## 2020-10-01 ENCOUNTER — Other Ambulatory Visit: Payer: Medicare Other

## 2020-10-07 ENCOUNTER — Ambulatory Visit
Admission: RE | Admit: 2020-10-07 | Discharge: 2020-10-07 | Disposition: A | Discharge status: Home / Self Care | Payer: Medicare Other | Source: Ambulatory Visit | Attending: Cardiology | Admitting: Cardiology

## 2020-10-07 DIAGNOSIS — I712 Thoracic aortic aneurysm, without rupture, unspecified: Secondary | ICD-10-CM

## 2020-10-07 MED ORDER — IOPAMIDOL (ISOVUE-370) INJECTION 76%
75.0000 mL | Freq: Once | INTRAVENOUS | Status: AC | PRN
  Administered 2020-10-07: 75 mL via INTRAVENOUS

## 2020-10-08 LAB — BASIC METABOLIC PANEL
BUN/Creatinine Ratio: 14 (ref 10–24)
BUN: 15 mg/dL (ref 8–27)
CO2: 26 mmol/L (ref 20–29)
Calcium: 9.5 mg/dL (ref 8.6–10.2)
Chloride: 96 mmol/L (ref 96–106)
Creatinine, Ser: 1.11 mg/dL (ref 0.76–1.27)
Glucose: 140 mg/dL — ABNORMAL HIGH (ref 65–99)
Potassium: 3.9 mmol/L (ref 3.5–5.2)
Sodium: 139 mmol/L (ref 134–144)
eGFR: 71 mL/min/{1.73_m2} (ref >59)

## 2021-01-02 ENCOUNTER — Other Ambulatory Visit (HOSPITAL_COMMUNITY): Payer: Self-pay | Admitting: Cardiology

## 2021-01-02 DIAGNOSIS — I714 Abdominal aortic aneurysm, without rupture, unspecified: Secondary | ICD-10-CM

## 2021-01-02 NOTE — Progress Notes (Signed)
HA     HPI: Fu atrial fibrillation. 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 He then developed sepsis. During the admission he was found to have recurrent paroxysmal atrial fibrillation. Monitor 3/15 showed sinus with pacs and PVCs. Abdominal ultrasound June 2021 showed right common iliac at 2.4 cm and left common iliac at 2.1 cm. The aorta measured 3.4 cm.  Echocardiogram February 2022 showed ejection fraction 50 to 55%, severe left ventricular hypertrophy, moderate left atrial enlargement, trace aortic insufficiency and mildly dilated ascending aorta at 45 mm.  CTA March 2022 showed dilatation of the thoracic aorta measuring 41 mm, enlarged main pulmonary artery and coronary calcification.  Since last seen, he denies dyspnea, chest pain, palpitations or syncope.  Current Outpatient Medications  Medication Sig Dispense Refill   apixaban (ELIQUIS) 5 MG TABS tablet Take 1 tablet (5 mg total) by mouth 2 (two) times daily. 180 tablet 3   diclofenac Sodium (VOLTAREN) 1 % GEL APPLY 2 GRAMS TO AFFECTED AREA 4 TIMES A DAY 100 g 1   diltiazem (TIAZAC) 360 MG 24 hr capsule Take 1 capsule (360 mg total) by mouth daily. 90 capsule 1   fluticasone (FLONASE) 50 MCG/ACT nasal spray Place 1 spray into both nostrils daily.     levothyroxine (SYNTHROID) 50 MCG tablet Take 1 tablet (50 mcg total) by mouth daily. 30 tablet 0   pantoprazole (PROTONIX) 40 MG tablet Take 1 tablet (40 mg total) by mouth 2 (two) times daily before a meal. 180 tablet 3   tamsulosin (FLOMAX) 0.4 MG CAPS capsule Take 1 capsule (0.4 mg total) by mouth at bedtime. 30 capsule 0   hydrochlorothiazide (HYDRODIURIL) 25 MG tablet Take 1 tablet (25 mg total) by mouth daily. 90 tablet 3   potassium chloride (KLOR-CON) 10 MEQ tablet Take 2 tablets (20 mEq total) by mouth daily. 180 tablet 3   No current facility-administered medications for this visit.     Past Medical History:  Diagnosis Date    Acute on chronic respiratory failure with hypoxia (HCC)    Acute pancreatitis with uninfected necrosis, unspecified    Chronic atrial fibrillation (HCC)    Essential hypertension, benign    GERD (gastroesophageal reflux disease)    Gouty arthropathy, unspecified    Hypothyroidism    Melanoma ([place])    Metabolic encephalopathy    Other and unspecified hyperlipidemia    Paroxysmal atrial fibrillation (HCC)    Pneumonia due to Pseudomonas aeruginosa ([place])    Severe sepsis [facility])     Past Surgical History:  Procedure Laterality Date   CHOLECYSTECTOMY  07/31/2019   [facility]   ESOPHAGEAL DILATION  01/15/2016   Procedure: ESOPHAGEAL DILATION;  Surgeon: Rogene Houston, MD;  Location: [facility] ENDO [facility];  Service: Endoscopy;;   ESOPHAGOGASTRODUODENOSCOPY N/A 01/15/2016   Procedure: ESOPHAGOGASTRODUODENOSCOPY (EGD);  Surgeon: Rogene Houston, MD;  Location: [facility] ENDO SUITE;  Service: Endoscopy;  Laterality: N/A;  1030   FRACTURE SURGERY Left    wrist, multiple surgeries   MELANOMA EXCISION Left 07/2018   groin   NEUROLYSIS MEDIAN NERVE Right 11/27/2013   Neurolysis Brachial Plexus   SHOULDER SURGERY Right     Social History   Socioeconomic History   Marital status: Married    Spouse name: Not on file   Number of children: Not on file   Years of education: Not on file   Highest education level: Not on file  Occupational History    Employer: [employer]  Comment: Dealer  Tobacco Use   Smoking status: Former    Pack years: 0.00    Types: Cigarettes    Quit date: 08/02/1996    Years since quitting: 24.4   Smokeless tobacco: Never   Tobacco comments:    quit 20 years ago  Vaping Use   Vaping Use: Never used  Substance and Sexual Activity   Alcohol use: Yes    Comment: Occasional   Drug use: No   Sexual activity: Not Currently  Other Topics Concern   Not on file  Social History Narrative   Has no children   Lives in [place] Determinants of Health    Financial Resource Strain: Not on file  Food Insecurity: Not on file  Transportation Needs: Not on file  Physical Activity: Not on file  Stress: Not on file  Social Connections: Not on file  Intimate Partner Violence: Not on file    Family History  Problem Relation Age of Onset   Breast cancer Mother    Other Father        unknown death cause   Diabetes Father     ROS: Arthralgias but no fevers or chills, productive cough, hemoptysis, dysphasia, odynophagia, melena, hematochezia, dysuria, hematuria, rash, seizure activity, orthopnea, PND, pedal edema, claudication. Remaining systems are negative.  Physical Exam: Well-developed well-nourished in no acute distress.  Skin is warm and dry.  HEENT is normal.  Neck is supple.  Chest is clear to auscultation with normal expansion.  Cardiovascular exam is irregular Abdominal exam nontender or distended. No masses palpated. Extremities show no edema. neuro grossly intact  Electrocardiogram shows atrial fibrillation at a rate of 87, just.  A/P  1 permanent atrial fibrillation-continue Cardizem for rate control.  Continue apixaban.  2 iliac aneurysm/abdominal aortic aneurysm-patient will need follow-up ultrasound this month.  3 cardiomyopathy-based on previous outside report LV function was reduced but in the setting of sepsis.  However follow-up echocardiogram here showed normalization of LV function.  4 hypertension-blood pressure controlled.  Continue present medical regimen.  5 dilated thoracic aorta-he will need follow-up CTs in the future.  6 coronary calcification-noted on CT scan.  He has not tolerated statins in the past.  I will check lipids and if LDL greater than 70 we will try Zetia.  [name], MD

## 2021-01-14 ENCOUNTER — Other Ambulatory Visit (HOSPITAL_COMMUNITY): Payer: Medicare Other

## 2021-01-15 ENCOUNTER — Encounter: Payer: Self-pay | Admitting: Cardiology

## 2021-01-15 ENCOUNTER — Ambulatory Visit (HOSPITAL_COMMUNITY)
Admission: RE | Admit: 2021-01-15 | Discharge: 2021-01-15 | Disposition: A | Discharge status: Home / Self Care | Payer: Medicare Other | Source: Ambulatory Visit | Attending: Internal Medicine | Admitting: Internal Medicine

## 2021-01-15 ENCOUNTER — Other Ambulatory Visit (HOSPITAL_COMMUNITY): Payer: Self-pay | Admitting: Cardiology

## 2021-01-15 ENCOUNTER — Ambulatory Visit (INDEPENDENT_AMBULATORY_CARE_PROVIDER_SITE_OTHER): Payer: Medicare Other | Admitting: Cardiology

## 2021-01-15 ENCOUNTER — Other Ambulatory Visit: Payer: Self-pay

## 2021-01-15 ENCOUNTER — Encounter: Payer: Self-pay | Admitting: *Deleted

## 2021-01-15 VITALS — BP 124/72 | HR 87 | Ht 72.0 in | Wt 214.4 lb

## 2021-01-15 DIAGNOSIS — I4821 Permanent atrial fibrillation: Secondary | ICD-10-CM

## 2021-01-15 DIAGNOSIS — E785 Hyperlipidemia, unspecified: Secondary | ICD-10-CM

## 2021-01-15 DIAGNOSIS — I714 Abdominal aortic aneurysm, without rupture, unspecified: Secondary | ICD-10-CM

## 2021-01-15 DIAGNOSIS — I10 Essential (primary) hypertension: Secondary | ICD-10-CM

## 2021-01-15 DIAGNOSIS — I712 Thoracic aortic aneurysm, without rupture, unspecified: Secondary | ICD-10-CM

## 2021-01-15 DIAGNOSIS — I723 Aneurysm of iliac artery: Secondary | ICD-10-CM

## 2021-01-15 NOTE — Patient Instructions (Signed)

## 2021-01-16 LAB — LIPID PANEL
Chol/HDL Ratio: 4.3 ratio (ref 0.0–5.0)
Cholesterol, Total: 160 mg/dL (ref 100–199)
HDL: 37 mg/dL — ABNORMAL LOW (ref >39)
LDL Chol Calc (NIH): 104 mg/dL — ABNORMAL HIGH (ref 0–99)
Triglycerides: 101 mg/dL (ref 0–149)
VLDL Cholesterol Cal: 19 mg/dL (ref 5–40)

## 2021-01-28 ENCOUNTER — Other Ambulatory Visit (INDEPENDENT_AMBULATORY_CARE_PROVIDER_SITE_OTHER): Payer: Self-pay

## 2021-01-28 DIAGNOSIS — K219 Gastro-esophageal reflux disease without esophagitis: Secondary | ICD-10-CM

## 2021-01-28 DIAGNOSIS — Z1211 Encounter for screening for malignant neoplasm of colon: Secondary | ICD-10-CM

## 2021-01-28 DIAGNOSIS — K2270 Barrett's esophagus without dysplasia: Secondary | ICD-10-CM

## 2021-01-29 ENCOUNTER — Encounter (INDEPENDENT_AMBULATORY_CARE_PROVIDER_SITE_OTHER): Payer: Self-pay

## 2021-01-29 ENCOUNTER — Telehealth: Payer: Self-pay | Admitting: *Deleted

## 2021-01-29 ENCOUNTER — Telehealth (INDEPENDENT_AMBULATORY_CARE_PROVIDER_SITE_OTHER): Payer: Self-pay

## 2021-01-29 DIAGNOSIS — Z01818 Encounter for other preprocedural examination: Secondary | ICD-10-CM

## 2021-01-29 DIAGNOSIS — I4821 Permanent atrial fibrillation: Secondary | ICD-10-CM

## 2021-01-29 MED ORDER — PEG 3350-KCL-NA BICARB-NACL 420 G PO SOLR: 4000.0000 mL | ORAL | 0 refills | Status: DC

## 2021-01-29 NOTE — Telephone Encounter (Signed)
Patient with diagnosis of atrial fibrillation on Eliquis for anticoagulation.    Procedure: colonscopy Date of procedure: 03/11/21   CHA2DS2-VASc Score = 4  This indicates a 4.8% annual risk of stroke. The patient's score is based upon: CHF History: Yes HTN History: Yes Diabetes History: Yes Stroke History: No Vascular Disease History: No Age Score: 1 Gender Score: 0   CrCl 74.9 (with adjusted body weight)  No record of platelet count since 08-2019.  Patient will need to get updated CBC for clearance to be determined

## 2021-01-29 NOTE — Telephone Encounter (Signed)
I called the pt to inform him that he was going to need lab work (CBC) for rep op clearance as he is on Eliquis. Pt states he is not driving to [place] or [place] with the price of gas just to have lab work. Pt states [place] where [place]. Pt states he can either have the lab work done 8/8 when he goes for his pre admission testing or we can go without the lab work. I stated that we will need the lab work, however let me relay the message back to the pharm-d and the pre op provider for further advice. Pt agreeable and will wait for a call back as to what decision has been made.

## 2021-01-29 NOTE — Telephone Encounter (Signed)
   [facility] Pre-operative Risk Assessment    Patient Name: Andrew Compton  DOB: 02-07-1951  MRN: [id number]   HEARTCARE STAFF: - Please ensure there is not already an duplicate clearance open for this procedure. - Under Visit Info/Reason for Call, type in Other and utilize the format Clearance MM/DD/YY or Clearance TBD. Do not use dashes or single digits. - If request is for dental extraction, please clarify the # of teeth to be extracted. - If the patient is currently at the dentist's office, call Pre-Op APP to address. If the patient is not currently in the dentist office, please route to the Pre-Op pool  Request for surgical clearance:  What type of surgery is being performed? COLONOSCOPY   When is this surgery scheduled? 03/11/21   What type of clearance is required (medical clearance vs. Pharmacy clearance to hold med vs. Both)? PHARMACY PER CLEARANCE REQUEST  Are there any medications that need to be held prior to surgery and how long? ELIQUIS X 2 DAYS PRIOR TO PROCEDURE   Practice name and name of physician performing surgery? [facility]; DR. Hildred Laser   What is the office phone number? [phone]   7.   What is the office fax number? [phone]  8.   Anesthesia type (None, local, MAC, general) ? MAC   Julaine Hua 01/29/2021, 12:26 PM  _________________________________________________________________   (provider comments below)

## 2021-01-29 NOTE — Telephone Encounter (Signed)
LeighAnn Augusta Mirkin, CMA  

## 2021-01-30 NOTE — Telephone Encounter (Signed)
I'm ok clearing the patient to hold Eliquis 2 days prior to the procedure without a CBC since no lovenox will be needed. He should have a CBC checked at preop labs as routine monitoring for his Eliquis.

## 2021-01-30 NOTE — Telephone Encounter (Signed)
   Primary Cardiologist: [name], MD  Chart reviewed as part of pre-operative protocol coverage. Given past medical history and time since last visit, based on ACC/AHA guidelines, Andrew Compton would be at acceptable risk for the planned procedure without further cardiovascular testing.   Patient with diagnosis of atrial fibrillation on Eliquis for anticoagulation.     Procedure: colonscopy Date of procedure: 03/11/21     CHA2DS2-VASc Score = 4  This indicates a 4.8% annual risk of stroke. The patient's score is based upon: CHF History: Yes HTN History: Yes Diabetes History: Yes Stroke History: No Vascular Disease History: No Age Score: 1 Gender Score: 0    CrCl 74.9 (with adjusted body weight)   No record of platelet count since 08-2019.  Patient will need to get updated CBC.  He may hold his Eliquis for 2 days prior to his procedure.  Please resume as soon as hemostasis is achieved.  I will route this recommendation to the requesting party via Epic fax function and remove from pre-op pool.  Please call with questions.  Jossie Ng. Klee Kolek NP-C    01/30/2021, 8:24 AM [place] [address] Office [phone] Fax [phone]

## 2021-02-04 ENCOUNTER — Other Ambulatory Visit: Payer: Self-pay | Admitting: Cardiology

## 2021-03-03 NOTE — Patient Instructions (Signed)
Your procedure is scheduled on: 03/11/2021  Report to [facility] at   8:30  AM.  Call this number if you have problems the morning of surgery: [phone]   Remember:              Follow Directions on the letter you received from Your Physician's office regarding the Bowel Prep              No Smoking the day of Procedure :   Take these medicines the morning of surgery with A SIP OF WATER: Diltiazem, Levothyroxine, and Pantoprazole               Hold Eliquis 2 days as instructed in letter from office   Do not wear jewelry, make-up or nail polish.    Do not bring valuables to the hospital.  Contacts, dentures or bridgework may not be worn into surgery.  .   Patients discharged the day of surgery will not be allowed to drive home.     Colonoscopy, Adult, Care After This sheet gives you information about how to care for yourself after your procedure. Your health care provider may also give you more specific instructions. If you have problems or questions, contact your health care provider. What can I expect after the procedure? After the procedure, it is common to have: A small amount of blood in your stool for 24 hours after the procedure. Some gas. Mild abdominal cramping or bloating.  Follow these instructions at home: General instructions  For the first 24 hours after the procedure: Do not drive or use machinery. Do not sign important documents. Do not drink alcohol. Do your regular daily activities at a slower pace than normal. Eat soft, easy-to-digest foods. Rest often. Take over-the-counter or prescription medicines only as told by your health care provider. It is up to you to get the results of your procedure. Ask your health care provider, or the department performing the procedure, when your results will be ready. Relieving cramping and bloating Try walking around when you have cramps or feel bloated. Apply heat to your abdomen as told by your health care  provider. Use a heat source that your health care provider recommends, such as a moist heat pack or a heating pad. Place a towel between your skin and the heat source. Leave the heat on for 20-30 minutes. Remove the heat if your skin turns bright red. This is especially important if you are unable to feel pain, heat, or cold. You may have a greater risk of getting burned. Eating and drinking Drink enough fluid to keep your urine clear or pale yellow. Resume your normal diet as instructed by your health care provider. Avoid heavy or fried foods that are hard to digest. Avoid drinking alcohol for as long as instructed by your health care provider. Contact a health care provider if: You have blood in your stool 2-3 days after the procedure. Get help right away if: You have more than a small spotting of blood in your stool. You pass large blood clots in your stool. Your abdomen is swollen. You have nausea or vomiting. You have a fever. You have increasing abdominal pain that is not relieved with medicine. This information is not intended to replace advice given to you by your health care provider. Make sure you discuss any questions you have with your health care provider. Document Released: 03/02/2004 Document Revised: 04/12/2016 Document Reviewed: 09/30/2015 Elsevier Interactive Patient Education  [employer].  Upper Endoscopy, Adult, Care After This sheet gives you information about how to care for yourself after your procedure. Your health care provider may also give you more specific instructions. If you have problems or questions, contact your health careprovider. What can I expect after the procedure? After the procedure, it is common to have: A sore throat. Mild stomach pain or discomfort. Bloating. Nausea. Follow these instructions at home:  Follow instructions from your health care provider about what to eat or drink after your procedure. Return to your normal activities  as told by your health care provider. Ask your health care provider what activities are safe for you. Take over-the-counter and prescription medicines only as told by your health care provider. If you were given a sedative during the procedure, it can affect you for several hours. Do not drive or operate machinery until your health care provider says that it is safe. Keep all follow-up visits as told by your health care provider. This is important. Contact a health care provider if you have: A sore throat that lasts longer than one day. Trouble swallowing. Get help right away if: You vomit blood or your vomit looks like coffee grounds. You have: A fever. Bloody, black, or tarry stools. A severe sore throat or you cannot swallow. Difficulty breathing. Severe pain in your chest or abdomen. Summary After the procedure, it is common to have a sore throat, mild stomach discomfort, bloating, and nausea. If you were given a sedative during the procedure, it can affect you for several hours. Do not drive or operate machinery until your health care provider says that it is safe. Follow instructions from your health care provider about what to eat or drink after your procedure. Return to your normal activities as told by your health care provider. This information is not intended to replace advice given to you by your health care provider. Make sure you discuss any questions you have with your healthcare provider. Document Revised: 07/17/2019 Document Reviewed: 12/19/2017 Elsevier Patient Education  2022 [employer].

## 2021-03-09 ENCOUNTER — Encounter (HOSPITAL_COMMUNITY)
Admission: RE | Admit: 2021-03-09 | Discharge: 2021-03-09 | Disposition: A | Discharge status: Home / Self Care | Payer: Medicare Other | Source: Ambulatory Visit | Attending: Internal Medicine | Admitting: Internal Medicine

## 2021-03-09 ENCOUNTER — Other Ambulatory Visit: Payer: Self-pay

## 2021-03-09 ENCOUNTER — Encounter (HOSPITAL_COMMUNITY): Payer: Self-pay

## 2021-03-09 DIAGNOSIS — Z01812 Encounter for preprocedural laboratory examination: Secondary | ICD-10-CM | POA: Insufficient documentation

## 2021-03-09 LAB — BASIC METABOLIC PANEL
Anion gap: 7 (ref 5–15)
BUN: 18 mg/dL (ref 8–23)
CO2: 31 mmol/L (ref 22–32)
Calcium: 9.3 mg/dL (ref 8.9–10.3)
Chloride: 96 mmol/L — ABNORMAL LOW (ref 98–111)
Creatinine, Ser: 1.14 mg/dL (ref 0.61–1.24)
GFR, Estimated: >60 mL/min (ref >60)
Glucose, Bld: 119 mg/dL — ABNORMAL HIGH (ref 70–99)
Potassium: 3.4 mmol/L — ABNORMAL LOW (ref 3.5–5.1)
Sodium: 134 mmol/L — ABNORMAL LOW (ref 135–145)

## 2021-03-11 ENCOUNTER — Ambulatory Visit (HOSPITAL_COMMUNITY): Payer: Medicare Other | Admitting: Anesthesiology

## 2021-03-11 ENCOUNTER — Encounter (HOSPITAL_COMMUNITY)
Admission: RE | Disposition: A | Discharge status: Home / Self Care | Payer: Self-pay | Source: Ambulatory Visit | Attending: Internal Medicine

## 2021-03-11 ENCOUNTER — Ambulatory Visit (HOSPITAL_COMMUNITY)
Admission: RE | Admit: 2021-03-11 | Discharge: 2021-03-11 | Disposition: A | Discharge status: Home / Self Care | Payer: Medicare Other | Source: Ambulatory Visit | Attending: Internal Medicine | Admitting: Internal Medicine

## 2021-03-11 ENCOUNTER — Other Ambulatory Visit: Payer: Self-pay

## 2021-03-11 ENCOUNTER — Encounter (HOSPITAL_COMMUNITY): Payer: Self-pay | Admitting: Internal Medicine

## 2021-03-11 DIAGNOSIS — K2270 Barrett's esophagus without dysplasia: Secondary | ICD-10-CM | POA: Insufficient documentation

## 2021-03-11 DIAGNOSIS — K644 Residual hemorrhoidal skin tags: Secondary | ICD-10-CM

## 2021-03-11 DIAGNOSIS — Z79890 Hormone replacement therapy (postmenopausal): Secondary | ICD-10-CM | POA: Diagnosis not present

## 2021-03-11 DIAGNOSIS — K219 Gastro-esophageal reflux disease without esophagitis: Secondary | ICD-10-CM | POA: Diagnosis not present

## 2021-03-11 DIAGNOSIS — K449 Diaphragmatic hernia without obstruction or gangrene: Secondary | ICD-10-CM | POA: Insufficient documentation

## 2021-03-11 DIAGNOSIS — Z79899 Other long term (current) drug therapy: Secondary | ICD-10-CM | POA: Diagnosis not present

## 2021-03-11 DIAGNOSIS — D127 Benign neoplasm of rectosigmoid junction: Secondary | ICD-10-CM

## 2021-03-11 DIAGNOSIS — Z1211 Encounter for screening for malignant neoplasm of colon: Secondary | ICD-10-CM | POA: Insufficient documentation

## 2021-03-11 DIAGNOSIS — D123 Benign neoplasm of transverse colon: Secondary | ICD-10-CM

## 2021-03-11 DIAGNOSIS — Z7901 Long term (current) use of anticoagulants: Secondary | ICD-10-CM | POA: Insufficient documentation

## 2021-03-11 DIAGNOSIS — K6389 Other specified diseases of intestine: Secondary | ICD-10-CM | POA: Diagnosis not present

## 2021-03-11 DIAGNOSIS — Z85820 Personal history of malignant melanoma of skin: Secondary | ICD-10-CM | POA: Diagnosis not present

## 2021-03-11 DIAGNOSIS — Z881 Allergy status to other antibiotic agents status: Secondary | ICD-10-CM | POA: Insufficient documentation

## 2021-03-11 DIAGNOSIS — Z882 Allergy status to sulfonamides status: Secondary | ICD-10-CM | POA: Diagnosis not present

## 2021-03-11 DIAGNOSIS — K2289 Other specified disease of esophagus: Secondary | ICD-10-CM | POA: Insufficient documentation

## 2021-03-11 DIAGNOSIS — Z880 Allergy status to penicillin: Secondary | ICD-10-CM | POA: Diagnosis not present

## 2021-03-11 DIAGNOSIS — Z87891 Personal history of nicotine dependence: Secondary | ICD-10-CM | POA: Diagnosis not present

## 2021-03-11 DIAGNOSIS — K5730 Diverticulosis of large intestine without perforation or abscess without bleeding: Secondary | ICD-10-CM

## 2021-03-11 HISTORY — PX: POLYPECTOMY: SHX5525

## 2021-03-11 HISTORY — PX: COLONOSCOPY WITH PROPOFOL: SHX5780

## 2021-03-11 HISTORY — PX: ESOPHAGOGASTRODUODENOSCOPY (EGD) WITH PROPOFOL: SHX5813

## 2021-03-11 HISTORY — PX: BIOPSY: SHX5522

## 2021-03-11 SURGERY — COLONOSCOPY WITH PROPOFOL
Anesthesia: General

## 2021-03-11 MED ORDER — EPHEDRINE SULFATE 50 MG/ML IJ SOLN
INTRAMUSCULAR | Status: DC | PRN
  Administered 2021-03-11: 10 mg via INTRAVENOUS

## 2021-03-11 MED ORDER — PROPOFOL 500 MG/50ML IV EMUL
INTRAVENOUS | Status: DC | PRN
  Administered 2021-03-11: 150 ug/kg/min via INTRAVENOUS

## 2021-03-11 MED ORDER — PROPOFOL 10 MG/ML IV BOLUS
INTRAVENOUS | Status: DC | PRN
  Administered 2021-03-11: 100 mg via INTRAVENOUS
  Administered 2021-03-11 (×2): 50 mg via INTRAVENOUS
  Administered 2021-03-11: 20 mg via INTRAVENOUS
  Administered 2021-03-11: 50 mg via INTRAVENOUS

## 2021-03-11 MED ORDER — LACTATED RINGERS IV SOLN: INTRAVENOUS | Status: DC

## 2021-03-11 MED ORDER — PROPOFOL 10 MG/ML IV BOLUS
INTRAVENOUS | Status: AC
  Filled 2021-03-11: qty 80

## 2021-03-11 MED ORDER — STERILE WATER FOR IRRIGATION IR SOLN
Status: DC | PRN
  Administered 2021-03-11: 100 mL

## 2021-03-11 MED ORDER — EPHEDRINE 5 MG/ML INJ
INTRAVENOUS | Status: AC
  Filled 2021-03-11: qty 5

## 2021-03-11 MED ORDER — LIDOCAINE HCL (CARDIAC) PF 100 MG/5ML IV SOSY
PREFILLED_SYRINGE | INTRAVENOUS | Status: DC | PRN
  Administered 2021-03-11: 50 mg via INTRAVENOUS

## 2021-03-11 NOTE — Discharge Instructions (Addendum)
Resume Eliquis/apixaban on 03/12/2021 Resume other medications and diet as before. No driving for 24 hours. Physician will call with biopsy results.

## 2021-03-11 NOTE — Op Note (Signed)
[facility] Patient Name: Andrew Compton Procedure Date: 03/11/2021 9:30 AM MRN: [id number] Date of Birth: 1951/05/14 Attending MD: Hildred Laser , MD CSN: [id number] Age: 70 Admit Type: Outpatient Procedure:                Upper GI endoscopy Indications:              Barrett's esophagus, Follow-up of Barrett's                            esophagus Providers:                Hildred Laser, MD, Rosina Lowenstein, RN, Nelma Rothman,                            Technician Referring MD:             Emelda Fear, DO Medicines:                Propofol per Anesthesia Complications:            No immediate complications. Estimated Blood Loss:     Estimated blood loss was minimal. Procedure:                Pre-Anesthesia Assessment:                           - Prior to the procedure, a History and Physical                            was performed, and patient medications and                            allergies were reviewed. The patient's tolerance of                            previous anesthesia was also reviewed. The risks                            and benefits of the procedure and the sedation                            options and risks were discussed with the patient.                            All questions were answered, and informed consent                            was obtained. Prior Anticoagulants: The patient                            last took Eliquis (apixaban) 3 days prior to the                            procedure. ASA Grade Assessment: III - A patient  with severe systemic disease. After reviewing the                            risks and benefits, the patient was deemed in                            satisfactory condition to undergo the procedure.                           After obtaining informed consent, the endoscope was                            passed under direct vision. Throughout the                            procedure, the patient's blood  pressure, pulse, and                            oxygen saturations were monitored continuously. The                            [id number] scope was introduced through the                            mouth, and advanced to the second part of duodenum.                            The upper GI endoscopy was accomplished without                            difficulty. The patient tolerated the procedure                            well. Scope In: 9:55:28 AM Scope Out: [id number] AM Total Procedure Duration: 0 hours 10 minutes 43 seconds  Findings:      The hypopharynx was normal.      The proximal esophagus and mid esophagus were normal.      There were esophageal mucosal changes secondary to established       long-segment Barrett's disease present in the distal esophagus. The       maximum longitudinal extent of these mucosal changes was 6 cm in length.       Mucosa was biopsied with a cold forceps for histology in 4 quadrants at       intervals of 1.5 cm in the upper third of the esophagus, in the middle       third of the esophagus and in the lower third of the esophagus. A total       of 3 specimen bottles were sent to pathology.      The Z-line was irregular and was found 38 cm from the incisors.      A 3 cm hiatal hernia was present.      The entire examined stomach was normal.      The duodenal bulb and second portion of the duodenum were normal. Impression:               -  Normal hypopharynx.                           - Normal proximal esophagus and mid esophagus.                           - Esophageal mucosal changes secondary to                            established long-segment Barrett's disease.                            Barrett's length is 6 cm with proximal margin at 32                            cm from the incisors. 4 squamous islands noted in                            the background of Barrett's mucosa.. Biopsied.                           - Z-line irregular, 38 cm  from the incisors.                           - 3 cm hiatal hernia.                           - Normal stomach.                           - Normal duodenal bulb and second portion of the                            duodenum. Moderate Sedation:      Per Anesthesia Care Recommendation:           - Patient has a contact number available for                            emergencies. The signs and symptoms of potential                            delayed complications were discussed with the                            patient. Return to normal activities tomorrow.                            Written discharge instructions were provided to the                            patient.                           - Resume previous diet today.                           -  Continue present medications.                           - Resume Eliquis (apixaban) at prior dose tomorrow.                           - Await pathology results.                           - Repeat upper endoscopy for surveillance based on                            pathology results. Procedure Code(s):        --- Professional ---                           [phone], Esophagogastroduodenoscopy, flexible,                            transoral; with biopsy, single or multiple Diagnosis Code(s):        --- Professional ---                           K22.70, Barrett's esophagus without dysplasia                           K22.8, Other specified diseases of esophagus                           K44.9, Diaphragmatic hernia without obstruction or                            gangrene CPT copyright 2019 American Medical Association. All rights reserved. The codes documented in this report are preliminary and upon coder review may  be revised to meet current compliance requirements. Hildred Laser, MD Hildred Laser, MD 03/11/2021 10:44:38 AM This report has been signed electronically. Number of Addenda: 0

## 2021-03-11 NOTE — Anesthesia Preprocedure Evaluation (Addendum)
Anesthesia Evaluation  Patient identified by MRN, date of birth, ID band Patient awake    Reviewed: Allergy & Precautions, NPO status , Patient's Chart, lab work & pertinent test results  Airway Mallampati: III  TM Distance: >3 FB Neck ROM: Full    Dental no notable dental hx. (+) Dental Advisory Given   Pulmonary neg pulmonary ROS, former smoker,    Pulmonary exam normal breath sounds clear to auscultation       Cardiovascular hypertension, Normal cardiovascular exam+ dysrhythmias Atrial Fibrillation  Rhythm:Regular Rate:Normal     Neuro/Psych Brachial plexopathy Upper extremity weakness    Neuromuscular disease negative psych ROS   GI/Hepatic Neg liver ROS, GERD  ,Barrett's esophagus Dysphagia GERD      Endo/Other  diabetesHypothyroidism   Renal/GU negative Renal ROS  negative genitourinary   Musculoskeletal  (+) Arthritis ,   Abdominal   Peds negative pediatric ROS (+)  Hematology  (+) anemia ,   Anesthesia Other Findings Melanoma  Reproductive/Obstetrics negative OB ROS                            Anesthesia Physical Anesthesia Plan  ASA: 3  Anesthesia Plan: General   Post-op Pain Management:    Induction: Intravenous  PONV Risk Score and Plan: TIVA  Airway Management Planned: Nasal Cannula  Additional Equipment:   Intra-op Plan:   Post-operative Plan:   Informed Consent: I have reviewed the patients History and Physical, chart, labs and discussed the procedure including the risks, benefits and alternatives for the proposed anesthesia with the patient or authorized representative who has indicated his/her understanding and acceptance.       Plan Discussed with: CRNA  Anesthesia Plan Comments:         Anesthesia Quick Evaluation

## 2021-03-11 NOTE — H&P (Addendum)
Andrew Compton is an 70 y.o. male.   Chief Complaint: Patient is here for esophagogastroduodenoscopy and colonoscopy. HPI: Patient is 70 year old Caucasian male who is here for surveillance esophagogastroduodenoscopy and screening colonoscopy.  He has chronic GERD complicated by long segment Barrett's esophagus.  Last EGD with biopsy was in June 2017.  He states heartburn is well controlled with therapy.  He denies dysphagia nausea vomiting abdominal pain melena or rectal bleeding.  His bowels move regularly.  He has good appetite.  His weight has been stable. Last colonoscopy was in 2011. Last apixaban dose was 3 days ago.   He does not take aspirin or NSAIDs. Family history is negative for CRC.  Past Medical History:  Diagnosis Date   Acute on chronic respiratory failure with hypoxia (HCC)    Acute pancreatitis with uninfected necrosis, unspecified    Chronic atrial fibrillation (HCC)    Essential hypertension, benign    GERD (gastroesophageal reflux disease)    Gouty arthropathy, unspecified    Hypothyroidism    Melanoma ([place])    Metabolic encephalopathy    Other and unspecified hyperlipidemia    Paroxysmal atrial fibrillation (HCC)    Pneumonia due to Pseudomonas aeruginosa ([place])    Severe sepsis [facility])     Past Surgical History:  Procedure Laterality Date   CHOLECYSTECTOMY  07/31/2019   [facility]   ESOPHAGEAL DILATION  01/15/2016   Procedure: ESOPHAGEAL DILATION;  Surgeon: Rogene Houston, MD;  Location: [facility];  Service: Endoscopy;;   ESOPHAGOGASTRODUODENOSCOPY N/A 01/15/2016   Procedure: ESOPHAGOGASTRODUODENOSCOPY (EGD);  Surgeon: Rogene Houston, MD;  Location: [facility];  Service: Endoscopy;  Laterality: N/A;  1030   FRACTURE SURGERY Left    wrist, multiple surgeries   MELANOMA EXCISION Left 07/2018   groin   NEUROLYSIS MEDIAN NERVE Right 11/27/2013   Neurolysis Brachial Plexus   SHOULDER SURGERY Right     Family History  Problem Relation Age  of Onset   Breast cancer Mother    Other Father        unknown death cause   Diabetes Father    Social History:  reports that he quit smoking about 24 years ago. His smoking use included cigarettes. He has never used smokeless tobacco. He reports current alcohol use. He reports that he does not use drugs.  Allergies:  Allergies  Allergen Reactions   Azithromycin Hives   Penicillins Other (See Comments)    Unknown reaction Did it involve swelling of the face/tongue/throat, SOB, or low BP? Unknown Did it involve sudden or severe rash/hives, skin peeling, or any reaction on the inside of your mouth or nose? Unknown Did you need to seek medical attention at a hospital or doctor's office? Unknown When did it last happen?      childhood If all above answers are "NO", may proceed with cephalosporin use.    Levophed [Norepinephrine Bitartrate] Other (See Comments)    unknown   Sulfa Antibiotics Other (See Comments)    unknown    Medications Prior to Admission  Medication Sig Dispense Refill   apixaban (ELIQUIS) 5 MG TABS tablet Take 1 tablet (5 mg total) by mouth 2 (two) times daily. 180 tablet 3   Biotin 10000 MCG TABS Take 10,000 mcg by mouth 2 (two) times daily.     diltiazem (TIAZAC) 360 MG 24 hr capsule TAKE 1 CAPSULE DAILY (Patient taking differently: Take 360 mg by mouth daily.) 90 capsule 1   fluticasone (FLONASE) 50 MCG/ACT nasal spray  Place 1 spray into both nostrils daily as needed for allergies.     hydrochlorothiazide (HYDRODIURIL) 25 MG tablet Take 1 tablet (25 mg total) by mouth daily. 90 tablet 3   levothyroxine (SYNTHROID) 50 MCG tablet Take 1 tablet (50 mcg total) by mouth daily. 30 tablet 0   pantoprazole (PROTONIX) 40 MG tablet Take 1 tablet (40 mg total) by mouth 2 (two) times daily before a meal. 180 tablet 3   polyethylene glycol-electrolytes (TRILYTE) 420 g solution Take 4,000 mLs by mouth as directed. 4000 mL 0   potassium chloride (KLOR-CON) 10 MEQ tablet Take 2  tablets (20 mEq total) by mouth daily. (Patient taking differently: Take 10 mEq by mouth 2 (two) times daily.) 180 tablet 3   Red Yeast Rice 600 MG CAPS Take 600 mg by mouth 2 (two) times daily.     tamsulosin (FLOMAX) 0.4 MG CAPS capsule Take 1 capsule (0.4 mg total) by mouth at bedtime. 30 capsule 0   diclofenac Sodium (VOLTAREN) 1 % GEL APPLY 2 GRAMS TO AFFECTED AREA 4 TIMES A DAY (Patient not taking: Reported on 03/03/2021) 100 g 1    Results for orders placed or performed during the hospital encounter of 03/09/21 (from the past 48 hour(s))  Basic metabolic panel     Status: Abnormal   Collection Time: 03/09/21 11:26 AM  Result Value Ref Range   Sodium 134 (L) 135 - 145 mmol/L   Potassium 3.4 (L) 3.5 - 5.1 mmol/L   Chloride 96 (L) 98 - 111 mmol/L   CO2 31 22 - 32 mmol/L   Glucose, Bld 119 (H) 70 - 99 mg/dL    Comment: Glucose reference range applies only to samples taken after fasting for at least 8 hours.   BUN 18 8 - 23 mg/dL   Creatinine, Ser 1.14 0.61 - 1.24 mg/dL   Calcium 9.3 8.9 - 10.3 mg/dL   GFR, Estimated >60 >60 mL/min    Comment: (NOTE) Calculated using the CKD-EPI Creatinine Equation (2021)    Anion gap 7 5 - 15    Comment: Performed at [facility], [address] [place], [place] [address]   No results found.  Review of Systems  Blood pressure (!) 136/95, pulse 90, temperature 98 F (36.7 C), temperature source Oral, resp. rate 16, SpO2 97 %. Physical Exam HENT:     Mouth/Throat:     Mouth: Mucous membranes are moist.     Pharynx: Oropharynx is clear.  Eyes:     General: No scleral icterus.    Conjunctiva/sclera: Conjunctivae normal.  Neck:     Comments: Tracheostomy scar. Cardiovascular:     Rate and Rhythm: Normal rate and regular rhythm.     Heart sounds: Normal heart sounds. No murmur heard. Pulmonary:     Effort: Pulmonary effort is normal.     Breath sounds: Normal breath sounds.  Abdominal:     General: There is no distension.     Palpations:  Abdomen is soft. There is no mass.     Tenderness: There is no abdominal tenderness.  Musculoskeletal:        General: No swelling.     Cervical back: Neck supple.  Lymphadenopathy:     Cervical: No cervical adenopathy.  Skin:    General: Skin is warm and dry.  Neurological:     Mental Status: He is alert.     Assessment/Plan  Long segment Barrett's esophagus. Surveillance esophagogastroduodenoscopy and average risk screening colonoscopy.  Hildred Laser, MD 03/11/2021,  9:46 AM

## 2021-03-11 NOTE — Transfer of Care (Signed)
Immediate Anesthesia Transfer of Care Note  Patient: Andrew Compton  Procedure(s) Performed: COLONOSCOPY WITH PROPOFOL ESOPHAGOGASTRODUODENOSCOPY (EGD) WITH PROPOFOL BIOPSY POLYPECTOMY  Patient Location: Short Stay  Anesthesia Type:General  Level of Consciousness: awake, alert  and oriented  Airway & Oxygen Therapy: Patient Spontanous Breathing  Post-op Assessment: Report given to RN and Post -op Vital signs reviewed and stable  Post vital signs: Reviewed and stable  Last Vitals:  Vitals Value Taken Time  BP    Temp    Pulse 61 03/11/21  1040  Resp    SpO2 100 03/11/21  1040    Last Pain:  Vitals:   03/11/21 0949  TempSrc:   PainSc: 0-No pain         Complications: No notable events documented.

## 2021-03-11 NOTE — Anesthesia Postprocedure Evaluation (Signed)
Anesthesia Post Note  Patient: Andrew Compton  Procedure(s) Performed: COLONOSCOPY WITH PROPOFOL ESOPHAGOGASTRODUODENOSCOPY (EGD) WITH PROPOFOL BIOPSY POLYPECTOMY  Patient location during evaluation: PACU Anesthesia Type: General Level of consciousness: awake and alert Pain management: pain level controlled Vital Signs Assessment: post-procedure vital signs reviewed and stable Respiratory status: spontaneous breathing, nonlabored ventilation, respiratory function stable and patient connected to nasal cannula oxygen Cardiovascular status: blood pressure returned to baseline and stable Postop Assessment: no apparent nausea or vomiting Anesthetic complications: no   No notable events documented.   Last Vitals:  Vitals:   03/11/21 1039 03/11/21 1053  BP: (!) 95/56 (!) 103/58  Pulse: 74   Resp: 17   Temp: 36.7 C   SpO2: 97%     Last Pain:  Vitals:   03/11/21 1039  TempSrc: Oral  PainSc:                  Nicanor Alcon

## 2021-03-11 NOTE — Op Note (Signed)
[facility] Patient Name: Andrew Compton Procedure Date: 03/11/2021 10:06 AM MRN: [id number] Date of Birth: Dec 16, 1950 Attending MD: Hildred Laser , MD CSN: [id number] Age: 70 Admit Type: Outpatient Procedure:                Colonoscopy Indications:              Screening for colorectal malignant neoplasm Providers:                Hildred Laser, MD, Rosina Lowenstein, RN, Nelma Rothman,                            Technician Referring MD:             Emelda Fear, DO Medicines:                Propofol per Anesthesia Complications:            No immediate complications. Estimated Blood Loss:     Estimated blood loss was minimal. Procedure:                Pre-Anesthesia Assessment:                           - Prior to the procedure, a History and Physical                            was performed, and patient medications and                            allergies were reviewed. The patient's tolerance of                            previous anesthesia was also reviewed. The risks                            and benefits of the procedure and the sedation                            options and risks were discussed with the patient.                            All questions were answered, and informed consent                            was obtained. Prior Anticoagulants: The patient                            last took Eliquis (apixaban) 3 days prior to the                            procedure. ASA Grade Assessment: III - A patient                            with severe systemic disease. After reviewing the  risks and benefits, the patient was deemed in                            satisfactory condition to undergo the procedure.                           After obtaining informed consent, the colonoscope                            was passed under direct vision. Throughout the                            procedure, the patient's blood pressure, pulse, and                             oxygen saturations were monitored continuously. The                            [id number] scope was introduced through                            the anus and advanced to the the cecum, identified                            by appendiceal orifice and ileocecal valve. The                            colonoscopy was somewhat difficult due to a                            redundant colon. The patient tolerated the                            procedure well. The quality of the bowel                            preparation was good. The ileocecal valve,                            appendiceal orifice, and rectum were photographed. Scope In: 10:09:36 AM Scope Out: 10:34:06 AM Scope Withdrawal Time: 0 hours 12 minutes 27 seconds  Total Procedure Duration: 0 hours 24 minutes 30 seconds  Findings:      The perianal and digital rectal examinations were normal.      A diffuse area of mild melanosis was found in the entire colon.      Two polyps were found in the recto-sigmoid colon and transverse colon.       The polyps were small in size. These polyps were removed with a cold       snare. Resection and retrieval were complete. The pathology specimen was       placed into Bottle Number 4.      Two polyps were found in the transverse colon. The polyps were small in       size. These were biopsied with a cold forceps for histology.  The       pathology specimen was placed into Bottle Number 4.      A few small-mouthed diverticula were found in the sigmoid colon.      External hemorrhoids were found during retroflexion. The hemorrhoids       were medium-sized. Impression:               - Melanosis in the colon.                           - Two small polyps at the recto-sigmoid colon and                            in the transverse colon, removed with a cold snare.                            Resected and retrieved.                           - Two small polyps in the transverse colon.                             Biopsied.                           - Diverticulosis in the sigmoid colon.                           - External hemorrhoids. Moderate Sedation:      Per Anesthesia Care Recommendation:           - Patient has a contact number available for                            emergencies. The signs and symptoms of potential                            delayed complications were discussed with the                            patient. Return to normal activities tomorrow.                            Written discharge instructions were provided to the                            patient.                           - High fiber diet today.                           - Continue present medications.                           - Resume Eliquis (apixaban) at prior dose tomorrow.                           -  Await pathology results.                           - Repeat colonoscopy is recommended. The                            colonoscopy date will be determined after pathology                            results from today's exam become available for                            review. Procedure Code(s):        --- Professional ---                           [phone], Colonoscopy, flexible; with removal of                            tumor(s), polyp(s), or other lesion(s) by snare                            technique                           45380, 25, Colonoscopy, flexible; with biopsy,                            single or multiple Diagnosis Code(s):        --- Professional ---                           Z12.11, Encounter for screening for malignant                            neoplasm of colon                           K63.89, Other specified diseases of intestine                           K63.5, Polyp of colon                           K64.4, Residual hemorrhoidal skin tags                           K57.30, Diverticulosis of large intestine without                            perforation or abscess  without bleeding CPT copyright 2019 American Medical Association. All rights reserved. The codes documented in this report are preliminary and upon coder review may  be revised to meet current compliance requirements. Hildred Laser, MD Hildred Laser, MD 03/11/2021 10:50:24 AM This report has been signed electronically. Number of Addenda: 0

## 2021-03-13 LAB — SURGICAL PATHOLOGY

## 2021-03-18 ENCOUNTER — Encounter (HOSPITAL_COMMUNITY): Payer: Self-pay | Admitting: Internal Medicine

## 2021-05-01 ENCOUNTER — Telehealth: Payer: Self-pay | Admitting: *Deleted

## 2021-05-01 NOTE — Telephone Encounter (Signed)
Left message for pt that he has been cleared for his upcoming surgery; as well as he will need to hold Eliquis x 3 days prior to surgery. If any questions please feel free to call our office or you may reach out to the surgeon's office as clearance notes and recommendations have been faxed.

## 2021-05-01 NOTE — Telephone Encounter (Signed)
   [facility] Pre-operative Risk Assessment    Patient Name: Andrew Compton  DOB: 02-20-1951 MRN: [id number]  HEARTCARE STAFF:  - IMPORTANT!!!!!! Under Visit Info/Reason for Call, type in Other and utilize the format Clearance MM/DD/YY or Clearance TBD. Do not use dashes or single digits. - Please review there is not already an duplicate clearance open for this procedure. - If request is for dental extraction, please clarify the # of teeth to be extracted. - If the patient is currently at the dentist's office, call Pre-Op Callback Staff (MA/nurse) to input urgent request.  - If the patient is not currently in the dentist office, please route to the Pre-Op pool.  Request for surgical clearance:  What type of surgery is being performed? L TKR  When is this surgery scheduled? TBD  What type of clearance is required (medical clearance vs. Pharmacy clearance to hold med vs. Both)? MEDICAL  Are there any medications that need to be held prior to surgery and how long? NONE  Practice name and name of physician performing surgery? [facility]  What is the office phone number? [phone]   7.   What is the office fax number? [phone]  8.   Anesthesia type (None, local, MAC, general) ? NOT LISTED   Fredia Beets 05/01/2021, 7:09 AM  _________________________________________________________________   (provider comments below)

## 2021-05-01 NOTE — Telephone Encounter (Addendum)
   Name: Andrew Compton  DOB: 1951-02-20  MRN: [id number]   Primary Cardiologist: [name], MD  Chart reviewed as part of pre-operative protocol coverage. Patient was contacted 05/01/2021 in reference to pre-operative risk assessment for pending surgery as outlined below.  Andrew Compton was last seen on 01/15/21 by Dr. Stanford Breed.  Since that day, Andrew Compton has done well.  He is limited by knee pain but can complete 4.0 METS without angina (yard work, housework). Although not specifically requested, patient is on eliquis. I will reach out to our pharmD for clarification on guidance.  Per our clinical pharmacist: Patient with diagnosis of afib on Eliquis for anticoagulation.     Procedure: L TKR Date of procedure: TBD   CHA2DS2-VASc Score = 4   This indicates a 4.8% annual risk of stroke. The patient's score is based upon: CHF History: 1 HTN History: 1 Diabetes History: 1 Stroke History: 0 Vascular Disease History: 0 Age Score: 1 Gender Score: 0   CrCl 66 ml/min   Per office protocol, patient can hold Eliquis for 3 days prior to procedure.   Therefore, based on ACC/AHA guidelines, the patient would be at acceptable risk for the planned procedure without further cardiovascular testing.   The patient was advised that if he develops new symptoms prior to surgery to contact our office to arrange for a follow-up visit, and he verbalized understanding.  I will route this recommendation to the requesting party via Epic fax function and remove from pre-op pool. Please call with questions.  Ledora Bottcher, PA 05/01/2021, 9:37 AM

## 2021-05-01 NOTE — Telephone Encounter (Signed)
Patient with diagnosis of afib on Eliquis for anticoagulation.    Procedure: L TKR Date of procedure: TBD  CHA2DS2-VASc Score = 4   This indicates a 4.8% annual risk of stroke. The patient's score is based upon: CHF History: 1 HTN History: 1 Diabetes History: 1 Stroke History: 0 Vascular Disease History: 0 Age Score: 1 Gender Score: 0      CrCl 66 ml/min  Per office protocol, patient can hold Eliquis for 3 days prior to procedure.

## 2021-07-25 ENCOUNTER — Other Ambulatory Visit: Payer: Self-pay | Admitting: Cardiology

## 2021-08-05 ENCOUNTER — Other Ambulatory Visit (INDEPENDENT_AMBULATORY_CARE_PROVIDER_SITE_OTHER): Payer: Self-pay | Admitting: Internal Medicine

## 2021-09-22 ENCOUNTER — Other Ambulatory Visit: Payer: Self-pay | Admitting: Cardiology

## 2021-09-22 DIAGNOSIS — E876 Hypokalemia: Secondary | ICD-10-CM

## 2021-10-20 IMAGING — CT CT ANGIO CHEST
2 of 6 series · 13 of 36 positions shown · IV contrast (iopamidol)
Comparison: None.

CLINICAL DATA: Follow-up thoracic aortic aneurysm.  Former smoker.

EXAM:
CT ANGIOGRAPHY CHEST WITH CONTRAST
TECHNIQUE: Multidetector CT imaging of the chest was performed using the
standard protocol during bolus administration of intravenous
contrast. Multiplanar CT image reconstructions and MIPs were
obtained to evaluate the vascular anatomy.
CONTRAST:  75mL KAPCTD-DGO IOPAMIDOL (KAPCTD-DGO) INJECTION 76%

[Series 4: cta thorax 2.00 bv36 s3 axial arterial · axial · arterial · 0.66mm/px · z∈[+1580,+1844]mm · 12 of 156 slices shown]
[im 12/156  lung]
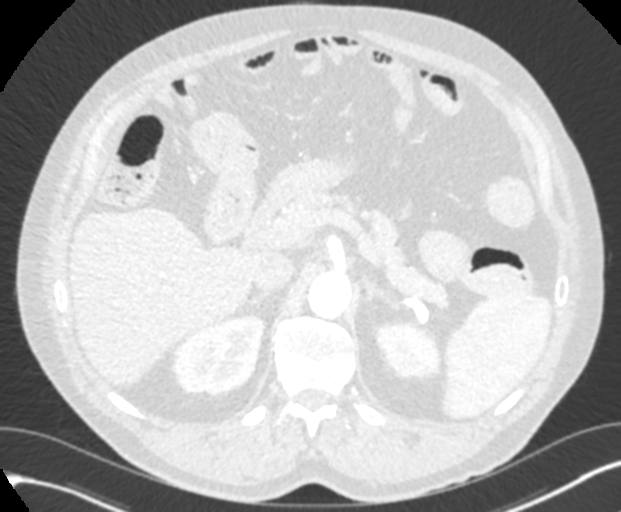
[im 24/156  mediastinal]
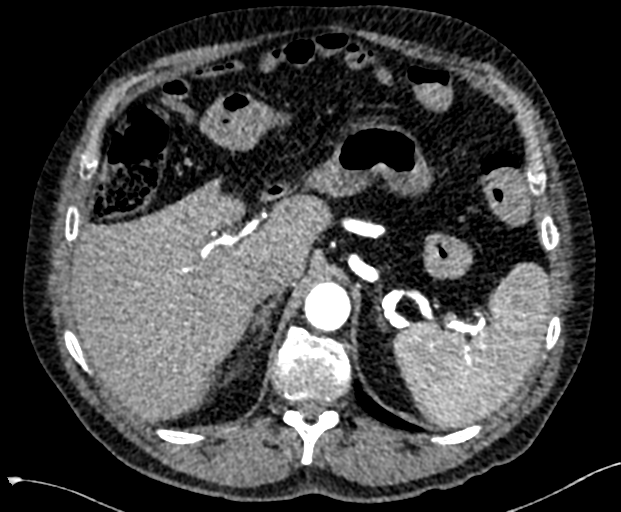
[im 36/156  lung]
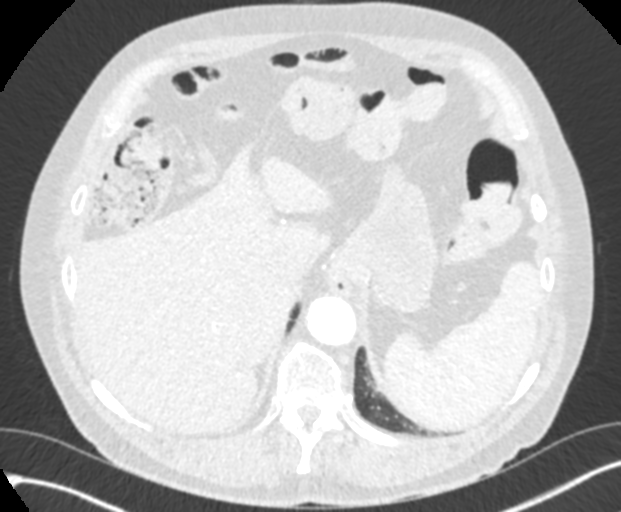
[im 48/156  mediastinal]
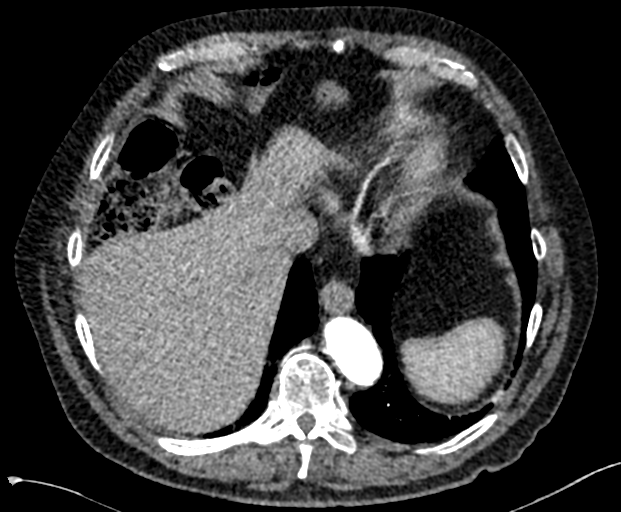
[im 60/156  lung]
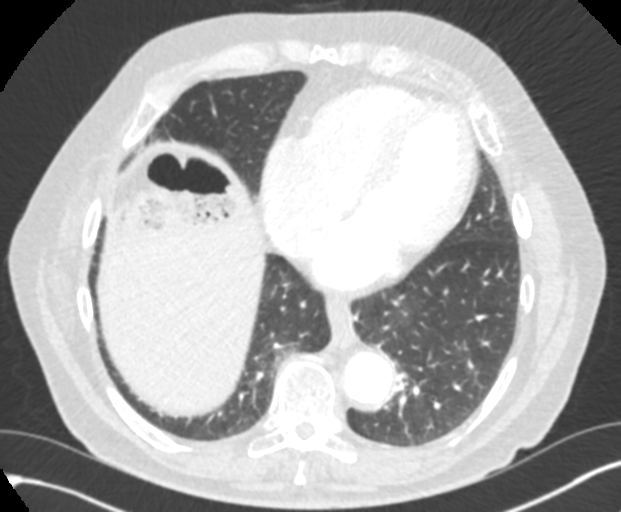
[im 72/156  mediastinal]
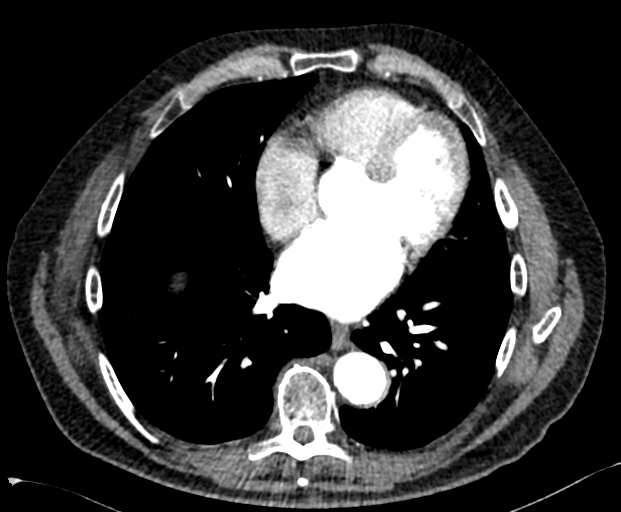
[im 84/156  lung]
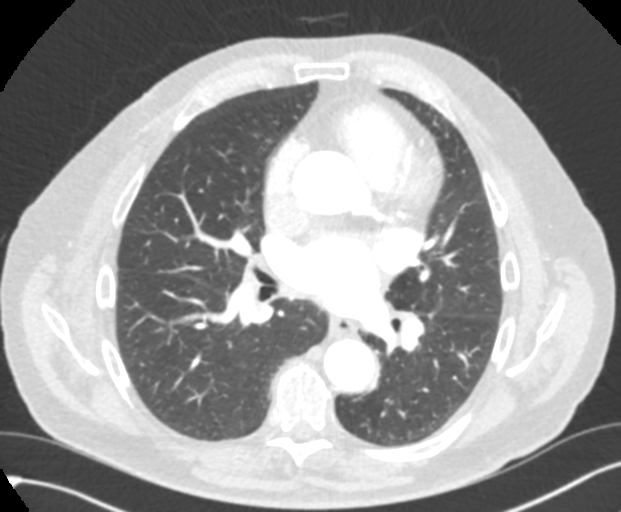
[im 96/156  mediastinal]
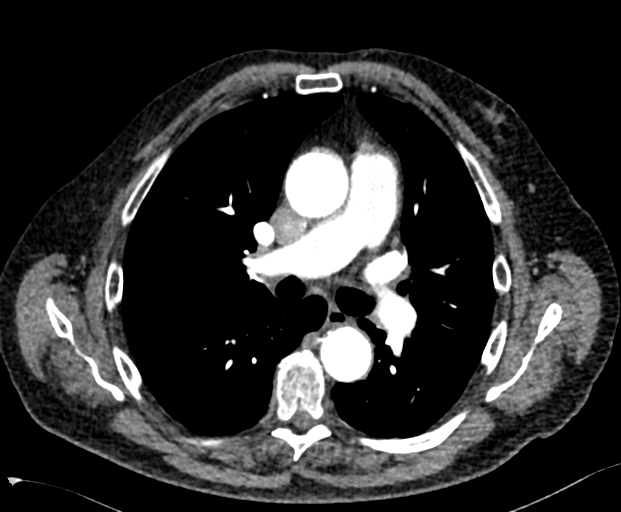
[im 108/156  lung]
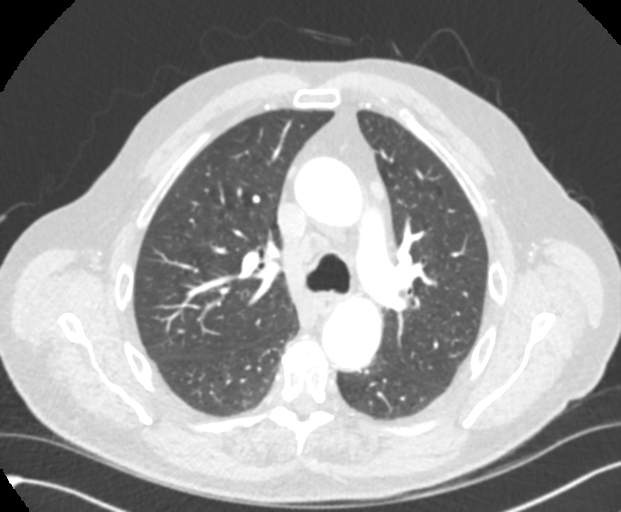
[im 120/156  mediastinal]
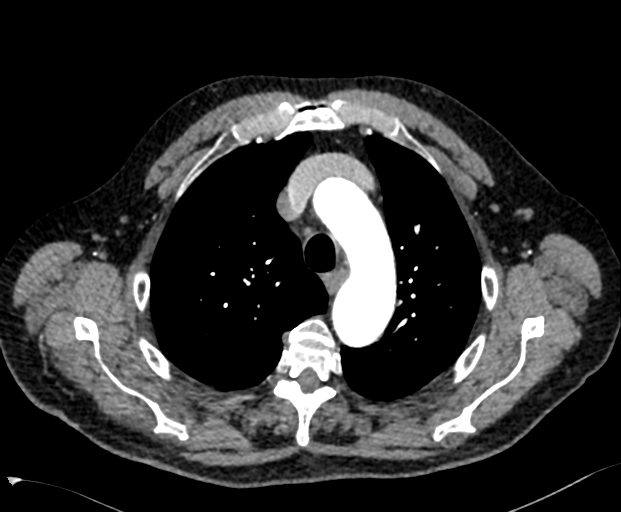
[im 132/156  lung]
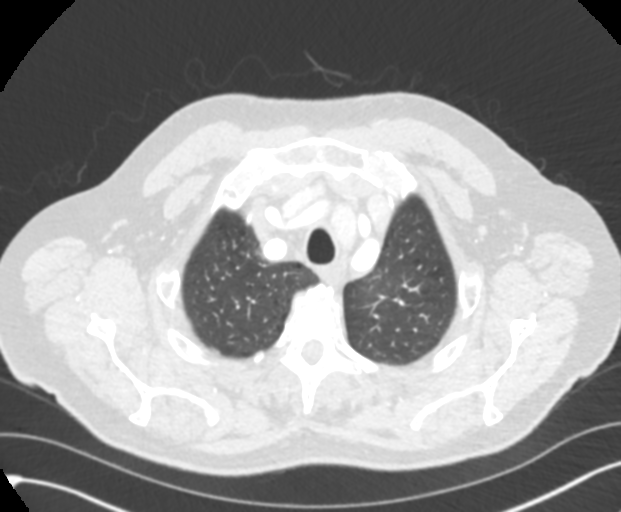
[im 144/156  mediastinal]
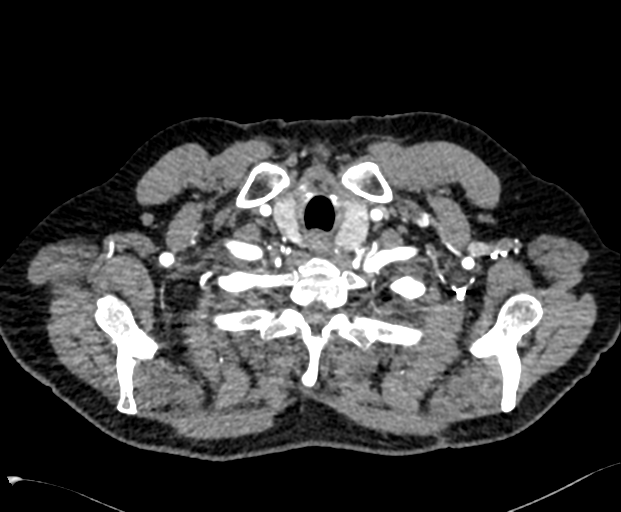

[Series 9: cta thorax 2.00 bv36 s3 cor st · coronal · 0.61mm/px · 1 of 169 slices shown]
[im 85/169  mediastinal]
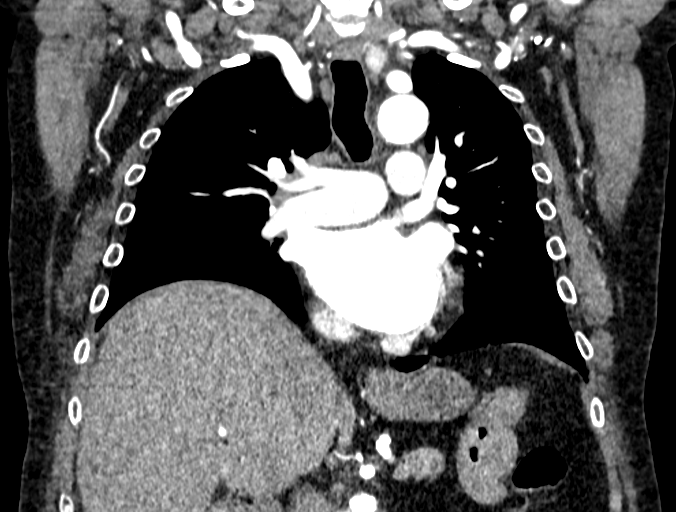

[13 of 36 positions shown; findings below may reference images not displayed]

FINDINGS: Vascular Findings:

Mild fusiform aneurysmal dilatation of the ascending thoracic aorta
with measurements as follows. The thoracic aorta tapers to a normal
caliber at the level of the aortic arch, however there is also mild
fusiform aneurysmal dilatation involving the proximal most aspect of
the descending thoracic aorta measuring 40 mm in diameter (sagittal
image 120, series 11). The descending thoracic aorta tapers to a
normal caliber at the level of the main pulmonary artery. The
remainder of the descending thoracic aorta is mildly tortuous but of
normal caliber. No evidence of thoracic aortic dissection or
periaortic stranding on this nongated examination. There is a
minimal amount of mixed calcified and noncalcified atherosclerotic
plaque involving the aortic arch and descending thoracic aorta, not
resulting in hemodynamically significant stenosis.

Conventional configuration of the aortic arch. The branch vessels of
the aortic arch are tortuous though widely patent without
hemodynamically significant narrowing.

Normal heart size. Suspected coronary artery calcifications. No
pericardial effusion though trace amount of fluid is seen within the
pericardial recess.

Although this examination was not tailored for the evaluation the
pulmonary arteries, there are no discrete filling defects within the
central pulmonary arterial tree to suggest central pulmonary
embolism. Enlarged caliber the main pulmonary artery measuring 35 mm
in diameter (axial image 54, series 4).

-------------------------------------------------------------

Thoracic aortic measurements:

SINOTUBULAR JUNCTION: 37 mm as measured in greatest oblique short
axis coronal dimension.

PROXIMAL ASCENDING THORACIC AORTA: 42 mm as measured in greatest
oblique short axis axial dimension at the level of the main
pulmonary artery (axial image 64, series 4) and approximately 41 mm
in greatest oblique short axis coronal diameter (coronal image 61,
series 9)

AORTIC ARCH: 32 mm as measured in greatest oblique short axis
sagittal dimension.

PROXIMAL DESCENDING THORACIC AORTA: 40 mm in greatest oblique short
axis sagittal diameter (image 120, series 11). The descending
thoracic aorta then tapers to a normal caliber measuring 33 mm as
measured in greatest oblique short axis axial dimension at the level
of the main pulmonary artery (image 64, series 4). The remainder of
the sent thoracic aorta is mildly tortuous but of normal caliber.

DISTAL DESCENDING THORACIC AORTA: 32 mm as measured in greatest
oblique short axis axial dimension at the level of the diaphragmatic
hiatus.

Review of the MIP images confirms the above findings.

-------------------------------------------------------------

Non-Vascular Findings:

Mediastinum/Lymph Nodes: Scattered mediastinal, prevascular and
right hilar lymph nodes are mildly prominent though individually not
enlarged by size criteria with index right suprahilar lymph node
measuring approximately 0.9 cm (image 59, series 4, index
prevascular lymph node measuring 0.6 cm (image 47, series 4 and
pretracheal lymph node measuring 1.2 cm though maintaining a benign
fatty hilum (image 50, series 4), presumably reactive in etiology.
No bulky mediastinal, hilar or axillary lymphadenopathy.

Lungs/Pleura: Mild apical predominant mixed centrilobular and
paraseptal emphysematous change. Minimal subsegmental atelectasis
adjacent to the thoracic spine descending thoracic aorta. No
discrete focal airspace opacities. No pleural effusion or
pneumothorax. The central pulmonary airways appear widely patent.

No discrete pulmonary nodules.

Upper abdomen: Limited early arterial phase evaluation of the upper
abdomen demonstrates interposition of the hepatic flexure of the
colon. Small hiatal hernia.

Musculoskeletal: No acute or aggressive osseous abnormalities.
Stigmata of dish within the thoracic spine.

Regional soft tissues appear normal. Normal appearance of the
thyroid gland.
IMPRESSION: 1. Uncomplicated mild fusiform aneurysmal dilatation of the
ascending thoracic aorta measuring 41 mm. The thoracic aorta tapers
to a normal caliber at the level of the aortic arch however there is
mild fusiform aneurysmal dilatation involving the proximal aspect of
the descending thoracic aorta measuring 40 mm in diameter. Recommend
annual imaging followup by CTA or MRA. This recommendation follows
4979 ACCF/AHA/AATS/ACR/ASA/SCA/IDRISSA/BULLETS/FERIENHAUS/ADRIC Guidelines for the
Diagnosis and Management of Patients with Thoracic Aortic Disease.
Circulation. 4979; 121: E266-e369. Aortic aneurysm NOS (XKZ22-M5U.B)
2. Enlarged caliber the main pulmonary artery, nonspecific though
could be seen in the setting of pulmonary arterial hypertension.
Further evaluation with cardiac echo could be performed as
indicated.
3. Coronary artery calcifications.
4. Aortic Atherosclerosis (XKZ22-MA7.7) and Emphysema (XKZ22-ZGE.4).

## 2021-12-18 ENCOUNTER — Other Ambulatory Visit: Payer: Self-pay | Admitting: Cardiology

## 2021-12-18 DIAGNOSIS — E876 Hypokalemia: Secondary | ICD-10-CM

## 2022-01-07 ENCOUNTER — Other Ambulatory Visit: Payer: Self-pay | Admitting: Cardiology

## 2022-01-07 NOTE — Telephone Encounter (Signed)
Prescription refill request for Eliquis received. Indication:Afib Last office visit:6/22 Scr:1.1 Age: 71 Weight:96.2 kg  Prescription refilled

## 2022-01-15 ENCOUNTER — Encounter: Payer: Self-pay | Admitting: Cardiology

## 2022-01-15 ENCOUNTER — Ambulatory Visit (HOSPITAL_COMMUNITY)
Admission: RE | Admit: 2022-01-15 | Discharge: 2022-01-15 | Disposition: A | Discharge status: Home / Self Care | Payer: Medicare Other | Source: Ambulatory Visit | Attending: Cardiology | Admitting: Cardiology

## 2022-01-15 DIAGNOSIS — I723 Aneurysm of iliac artery: Secondary | ICD-10-CM | POA: Diagnosis present

## 2022-01-28 ENCOUNTER — Other Ambulatory Visit: Payer: Self-pay | Admitting: Cardiology

## 2022-02-11 ENCOUNTER — Other Ambulatory Visit: Payer: Self-pay | Admitting: Cardiology

## 2022-02-11 DIAGNOSIS — E876 Hypokalemia: Secondary | ICD-10-CM

## 2022-03-06 ENCOUNTER — Other Ambulatory Visit: Payer: Self-pay | Admitting: Cardiology

## 2022-03-06 DIAGNOSIS — E876 Hypokalemia: Secondary | ICD-10-CM

## 2022-03-18 ENCOUNTER — Encounter (INDEPENDENT_AMBULATORY_CARE_PROVIDER_SITE_OTHER): Payer: Self-pay | Admitting: Gastroenterology

## 2022-03-18 ENCOUNTER — Ambulatory Visit (INDEPENDENT_AMBULATORY_CARE_PROVIDER_SITE_OTHER): Payer: Medicare Other | Admitting: Gastroenterology

## 2022-03-18 VITALS — BP 102/69 | HR 80 | Temp 97.9°F | Ht 71.5 in | Wt 227.6 lb

## 2022-03-18 DIAGNOSIS — K219 Gastro-esophageal reflux disease without esophagitis: Secondary | ICD-10-CM

## 2022-03-18 DIAGNOSIS — K2270 Barrett's esophagus without dysplasia: Secondary | ICD-10-CM | POA: Diagnosis not present

## 2022-03-18 NOTE — Patient Instructions (Signed)
Continue pantoprazole 40 mg twice a day Repeat EGD and colonoscopy in 2027

## 2022-03-18 NOTE — Progress Notes (Signed)
Andrew Compton, M.D. Gastroenterology & Hepatology [facility] [address] [place], [place] [address]  Primary Care Physician: Andrew Fear, DO [address] [place] [address]  I will communicate my assessment and recommendations to the referring MD via EMR.  Problems: GERD History of Barrett's esophagus History of esophageal stricture  History of Present Illness: Andrew Compton is a 71 y.o. male with past medical history of GERD and long segment Barrett's esophagus, atrial fibrillation, hypertension, melanoma, hypothyroidism, who presents for follow up of GERD  The patient was last seen on 08/12/2020. At that time, the patient was sent the prescription for pantoprazole 40 mg twice a day.  States that he has tried in the past going down to one Protonix a day, but he had recurrence of symptoms.  He has been compliantly taking Protonix twice a day.  Denies any complaints such as heartburn, dysphagia or odynophagia. The patient denies having any nausea, vomiting, fever, chills, hematochezia, melena, hematemesis, abdominal distention, abdominal pain, diarrhea, jaundice, pruritus or weight loss.  Patient asked about candidacy for TIF.  Last EGD: 03/11/2021 - Normal hypopharynx. - Normal proximal esophagus and mid esophagus. - Esophageal mucosal changes secondary to established long-segment Barrett's disease. Barrett's length is 6 cm with proximal margin at 32 cm from the incisors. 4 squamous islands noted in the background of Barrett's mucosa.. Biopsied. - Z-line irregular, 38 cm from the incisors. - 3 cm hiatal hernia. - Normal stomach. - Normal duodenal bulb and second portion of the duodenum.  Last Colonoscopy: 03/11/2021 - Melanosis in the colon. - Two small polyps at the recto-sigmoid colon and in the transverse colon, removed with a cold snare. Resected and retrieved. - Two small polyps in the transverse colon.  Biopsied. - Diverticulosis in the sigmoid colon. - External hemorrhoids.  Path A. ESOPHAGUS, DISTAL, BIOPSY:  -  Barrett esophagus  -  No dysplasia or malignancy identified   B. ESOPHAGUS, MID, BIOPSY:  -  Barrett esophagus  -  No dysplasia or malignancy identified   C. ESOPHAGUS, PROXIMAL, BIOPSY:  -  Barrett esophagus  -  No dysplasia or malignancy identified   D. COLON, TRANSVERSE AND RECTOSIGMOID, POLYPECTOMY:  -  Tubular adenoma (4 of 4 fragments)  -  No high-grade dysplasia or malignancy identified   Recommended repeat EGD and colonoscopy in 5 years  Past Medical History: Past Medical History:  Diagnosis Date   Acute on chronic respiratory failure with hypoxia (HCC)    Acute pancreatitis with uninfected necrosis, unspecified    Chronic atrial fibrillation (HCC)    Essential hypertension, benign    GERD (gastroesophageal reflux disease)    Gouty arthropathy, unspecified    Hypothyroidism    Melanoma (HCC)    Metabolic encephalopathy    Other and unspecified hyperlipidemia    Paroxysmal atrial fibrillation (HCC)    Pneumonia due to Pseudomonas aeruginosa (HCC)    Severe sepsis ([place])     Past Surgical History: Past Surgical History:  Procedure Laterality Date   BIOPSY N/A 03/11/2021   Procedure: BIOPSY;  Surgeon: Rogene Houston, MD;  [facility];  Service: Endoscopy;  Laterality: N/A;   CHOLECYSTECTOMY  07/31/2019   [facility]   COLONOSCOPY WITH PROPOFOL N/A 03/11/2021   Procedure: COLONOSCOPY WITH PROPOFOL;  Surgeon: Rogene Houston, MD;  [facility];  Service: Endoscopy;  Laterality: N/A;  10:00   ESOPHAGEAL DILATION  01/15/2016   Procedure: ESOPHAGEAL DILATION;  Surgeon: Mechele Dawley  Laural Golden, MD;  [facility];  Service: Endoscopy;;   ESOPHAGOGASTRODUODENOSCOPY N/A 01/15/2016   Procedure: ESOPHAGOGASTRODUODENOSCOPY (EGD);  Surgeon: Rogene Houston, MD;  [facility];  Service: Endoscopy;  Laterality: N/A;   1030   ESOPHAGOGASTRODUODENOSCOPY (EGD) WITH PROPOFOL N/A 03/11/2021   Procedure: ESOPHAGOGASTRODUODENOSCOPY (EGD) WITH PROPOFOL;  Surgeon: Rogene Houston, MD;  [facility];  Service: Endoscopy;  Laterality: N/A;   FRACTURE SURGERY Left    wrist, multiple surgeries   MELANOMA EXCISION Left 07/2018   groin   NEUROLYSIS MEDIAN NERVE Right 11/27/2013   Neurolysis Brachial Plexus   POLYPECTOMY  03/11/2021   Procedure: POLYPECTOMY;  Surgeon: Rogene Houston, MD;  [facility];  Service: Endoscopy;;   SHOULDER SURGERY Right     Family History: Family History  Problem Relation Age of Onset   Breast cancer Mother    Other Father        unknown death cause   Diabetes Father     Social History: Social History   Tobacco Use  Smoking Status Former   Types: Cigarettes   Quit date: 08/02/1996   Years since quitting: 25.6  Smokeless Tobacco Never  Tobacco Comments   quit 20 years ago   Social History   Substance and Sexual Activity  Alcohol Use Yes   Comment: Occasional   Social History   Substance and Sexual Activity  Drug Use No    Allergies: Allergies  Allergen Reactions   Azithromycin Hives   Penicillins Other (See Comments)    Unknown reaction Did it involve swelling of the face/tongue/throat, SOB, or low BP? Unknown Did it involve sudden or severe rash/hives, skin peeling, or any reaction on the inside of your mouth or nose? Unknown Did you need to seek medical attention at a hospital or doctor's office? Unknown When did it last happen?      childhood If all above answers are "NO", may proceed with cephalosporin use.    Levophed [Norepinephrine Bitartrate] Other (See Comments)    unknown   Sulfa Antibiotics Other (See Comments)    unknown    Medications: Current Outpatient Medications  Medication Sig Dispense Refill   Biotin 10000 MCG TABS Take 10,000 mcg by mouth 2 (two) times daily.     colchicine 0.6 MG tablet Take 0.6 mg by mouth  as needed.     diltiazem (TIAZAC) 360 MG 24 hr capsule TAKE 1 CAPSULE DAILY 90 capsule 1   ELIQUIS 5 MG TABS tablet TAKE 1 TABLET TWICE A DAY 180 tablet 3   fluticasone (FLONASE) 50 MCG/ACT nasal spray Place 1 spray into both nostrils daily as needed for allergies.     hydrochlorothiazide (HYDRODIURIL) 25 MG tablet Take 1 tablet (25 mg total) by mouth daily. 90 tablet 3   levothyroxine (SYNTHROID) 50 MCG tablet Take 1 tablet (50 mcg total) by mouth daily. 30 tablet 0   pantoprazole (PROTONIX) 40 MG tablet TAKE 1 TABLET BY MOUTH TWICE A DAY BEFORE MEALS 180 tablet 3   potassium chloride (KLOR-CON) 10 MEQ tablet TAKE 1 TABLET (10 MEQ TOTAL) BY MOUTH DAILY. PLEASE SCHEDULE APPOINTMENT FOR FURTHER REFILLS. 2ND ATTEMPT. (Patient taking differently: Take 10 mEq by mouth 2 (two) times daily. Please schedule appointment for further refills. 2nd attempt.) 30 tablet 0   Red Yeast Rice 600 MG CAPS Take 600 mg by mouth 2 (two) times daily.     tamsulosin (FLOMAX) 0.4 MG CAPS capsule Take 1 capsule (0.4 mg total) by  mouth at bedtime. 30 capsule 0   No current facility-administered medications for this visit.    Review of Systems: GENERAL: negative for malaise, night sweats HEENT: No changes in hearing or vision, no nose bleeds or other nasal problems. NECK: Negative for lumps, goiter, pain and significant neck swelling RESPIRATORY: Negative for cough, wheezing CARDIOVASCULAR: Negative for chest pain, leg swelling, palpitations, orthopnea GI: SEE HPI MUSCULOSKELETAL: Negative for joint pain or swelling, back pain, and muscle pain. SKIN: Negative for lesions, rash PSYCH: Negative for sleep disturbance, mood disorder and recent psychosocial stressors. HEMATOLOGY Negative for prolonged bleeding, bruising easily, and swollen nodes. ENDOCRINE: Negative for cold or heat intolerance, polyuria, polydipsia and goiter. NEURO: negative for tremor, gait imbalance, syncope and seizures. The remainder of the review  of systems is noncontributory.   Physical Exam: BP 102/69 (BP Location: Left Arm, Patient Position: Sitting, Cuff Size: Large)   Pulse 80   Temp 97.9 F (36.6 C) (Oral)   Ht 5' 11.5" (1.816 m)   Wt 227 lb 9.6 oz (103.2 kg)   BMI 31.30 kg/m  GENERAL: The patient is AO x3, in no acute distress. HEENT: Head is normocephalic and atraumatic. EOMI are intact. Mouth is well hydrated and without lesions. NECK: Supple. No masses LUNGS: Clear to auscultation. No presence of rhonchi/wheezing/rales. Adequate chest expansion HEART: RRR, normal s1 and s2. ABDOMEN: Soft, nontender, no guarding, no peritoneal signs, and nondistended. BS +. No masses. EXTREMITIES: Without any cyanosis, clubbing, rash, lesions or edema. NEUROLOGIC: AOx3, no focal motor deficit. SKIN: no jaundice, no rashes  Imaging/Labs: as above  I personally reviewed and interpreted the available labs, imaging and endoscopic files.  Impression and Plan: Andrew Compton is a 71 y.o. male with past medical history of GERD and long segment Barrett's esophagus, atrial fibrillation, hypertension, melanoma, hypothyroidism, who presents for follow up of GERD.  Patient has been doing well in terms of his reflux symptoms and denies any complaints while taking PPI twice daily.  We discussed TIF today as the patient was interested to hear about the benefits and risk of this procedure.  I explained to him that there are no studies to have evaluated the role of TIF in patients with Barrett's esophagus.  I discussed that there was a possibility he will still need to take some PPI to prevent progression of his Barrett's esophagus to dysplasia, especially as he has longstanding Barrett's esophagus.  He would like to hold off on undergoing this procedure and will continue with pantoprazole 40 mg twice a day.  He is due for repeat EGD and colonoscopy in 2027.  - Continue pantoprazole 40 mg twice a day - Repeat EGD and colonoscopy in 2027  All  questions were answered.      Harvel Quale, MD Gastroenterology and Hepatology [facility]

## 2022-04-03 ENCOUNTER — Other Ambulatory Visit: Payer: Self-pay | Admitting: Cardiology

## 2022-04-03 DIAGNOSIS — E876 Hypokalemia: Secondary | ICD-10-CM

## 2022-04-30 ENCOUNTER — Other Ambulatory Visit: Payer: Self-pay | Admitting: Cardiology

## 2022-04-30 DIAGNOSIS — E876 Hypokalemia: Secondary | ICD-10-CM

## 2022-06-20 ENCOUNTER — Encounter (INDEPENDENT_AMBULATORY_CARE_PROVIDER_SITE_OTHER): Payer: Self-pay | Admitting: Gastroenterology

## 2022-07-22 ENCOUNTER — Other Ambulatory Visit: Payer: Self-pay | Admitting: Cardiology

## 2022-08-20 NOTE — Progress Notes (Signed)
HPI: Fu atrial fibrillation. 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 He then developed sepsis. During the admission he was found to have recurrent paroxysmal atrial fibrillation. Monitor 3/15 showed sinus with pacs and PVCs. Echocardiogram February 2022 showed ejection fraction 50 to 55%, severe left ventricular hypertrophy, moderate left atrial enlargement, trace aortic insufficiency and mildly dilated ascending aorta at 45 mm.  CTA March 2022 showed dilatation of the thoracic aorta measuring 41 mm, enlarged main pulmonary artery and coronary calcification.  Abdominal ultrasound June 2023 showed no abdominal aortic aneurysm but there was dilatation of the right common iliac and left common iliac measuring 2.8 cm.  Since last seen, patient denies dyspnea, chest pain, palpitations or syncope.  No bleeding.  Current Outpatient Medications  Medication Sig Dispense Refill   Biotin 10000 MCG TABS Take 10,000 mcg by mouth 2 (two) times daily.     colchicine 0.6 MG tablet Take 0.6 mg by mouth as needed.     diltiazem (TIAZAC) 360 MG 24 hr capsule Take 1 capsule (360 mg total) by mouth daily. 90 capsule 1   ELIQUIS 5 MG TABS tablet TAKE 1 TABLET TWICE A DAY 180 tablet 3   fluticasone (FLONASE) 50 MCG/ACT nasal spray Place 1 spray into both nostrils daily as needed for allergies.     levothyroxine (SYNTHROID) 50 MCG tablet Take 1 tablet (50 mcg total) by mouth daily. 30 tablet 0   pantoprazole (PROTONIX) 40 MG tablet TAKE 1 TABLET BY MOUTH TWICE A DAY BEFORE MEALS 180 tablet 3   potassium chloride (KLOR-CON) 10 MEQ tablet TAKE 1 TABLET BY MOUTH EVERY DAY 30 tablet 6   Red Yeast Rice 600 MG CAPS Take 600 mg by mouth 2 (two) times daily.     tamsulosin (FLOMAX) 0.4 MG CAPS capsule Take 1 capsule (0.4 mg total) by mouth at bedtime. 30 capsule 0   hydrochlorothiazide (HYDRODIURIL) 25 MG tablet Take 1 tablet (25 mg total) by mouth daily. 90 tablet 3   No  current facility-administered medications for this visit.     Past Medical History:  Diagnosis Date   Acute on chronic respiratory failure with hypoxia (HCC)    Acute pancreatitis with uninfected necrosis, unspecified    Chronic atrial fibrillation (HCC)    Essential hypertension, benign    GERD (gastroesophageal reflux disease)    Gouty arthropathy, unspecified    Hypothyroidism    Melanoma ([place])    Metabolic encephalopathy    Other and unspecified hyperlipidemia    Paroxysmal atrial fibrillation (HCC)    Pneumonia due to Pseudomonas aeruginosa ([place])    Severe sepsis [facility])     Past Surgical History:  Procedure Laterality Date   BIOPSY N/A 03/11/2021   Procedure: BIOPSY;  Surgeon: Rogene Houston, MD;  [facility];  Service: Endoscopy;  Laterality: N/A;   CHOLECYSTECTOMY  07/31/2019   [facility]   COLONOSCOPY WITH PROPOFOL N/A 03/11/2021   Procedure: COLONOSCOPY WITH PROPOFOL;  Surgeon: Rogene Houston, MD;  Location[facility];  Service: Endoscopy;  Laterality: N/A;  10:00   ESOPHAGEAL DILATION  01/15/2016   Procedure: ESOPHAGEAL DILATION;  Surgeon: Rogene Houston, MD;  Location: [facility];  Service: Endoscopy;;   ESOPHAGOGASTRODUODENOSCOPY N/A 01/15/2016   Procedure: ESOPHAGOGASTRODUODENOSCOPY (EGD);  Surgeon: Rogene Houston, MD;  Location[facility];  Service: Endoscopy;  Laterality: N/A;  1030   ESOPHAGOGASTRODUODENOSCOPY (EGD) WITH PROPOFOL N/A 03/11/2021   Procedure: ESOPHAGOGASTRODUODENOSCOPY (  EGD) WITH PROPOFOL;  Surgeon: Rogene Houston, MD;  [facility];  Service: Endoscopy;  Laterality: N/A;   FRACTURE SURGERY Left    wrist, multiple surgeries   MELANOMA EXCISION Left 07/2018   groin   NEUROLYSIS MEDIAN NERVE Right 11/27/2013   Neurolysis Brachial Plexus   POLYPECTOMY  03/11/2021   Procedure: POLYPECTOMY;  Surgeon: Rogene Houston, MD;  [facility];  Service: Endoscopy;;   SHOULDER SURGERY Right      Social History   Socioeconomic History   Marital status: Married    Spouse name: Not on file   Number of children: Not on file   Years of education: Not on file   Highest education level: Not on file  Occupational History    Employer: [employer]    Comment: [employer] [place]  Tobacco Use   Smoking status: Former    Types: Cigarettes    Quit date: 08/02/1996    Years since quitting: 26.1   Smokeless tobacco: Never   Tobacco comments:    quit 20 years ago  Vaping Use   Vaping Use: Never used  Substance and Sexual Activity   Alcohol use: Yes    Comment: Occasional   Drug use: No   Sexual activity: Not Currently  Other Topics Concern   Not on file  Social History Narrative   Has no children   Lives in [place] Determinants of Health   Financial Resource Strain: Not on file  Food Insecurity: Not on file  Transportation Needs: Not on file  Physical Activity: Not on file  Stress: Not on file  Social Connections: Not on file  Intimate Partner Violence: Not on file    Family History  Problem Relation Age of Onset   Breast cancer Mother    Other Father        unknown death cause   Diabetes Father     ROS: no fevers or chills, productive cough, hemoptysis, dysphasia, odynophagia, melena, hematochezia, dysuria, hematuria, rash, seizure activity, orthopnea, PND, pedal edema, claudication. Remaining systems are negative.  Physical Exam: Well-developed well-nourished in no acute distress.  Skin is warm and dry.  HEENT is normal.  Neck is supple.  Chest is clear to auscultation with normal expansion.  Cardiovascular exam is irregular Abdominal exam nontender or distended. No masses palpated. Extremities show no edema. neuro grossly intact  ECG-atrial fibrillation at a rate of 87, normal axis, no ST changes.  Personally reviewed  A/P  1 permanent atrial fibrillation-continue Cardizem at present dose for rate control.  Continue apixaban.  2  iliac/abdominal aneurysm-plan follow-up ultrasound June 2024.  3 dilated thoracic aorta-we will arrange follow-up CTA to further assess.  4 cardiomyopathy-based on previous outside echocardiogram patient had reduced LV function in the past but this was in the setting of sepsis.  Most recent study showed normalization.  5 hypertension-blood pressure controlled.  Continue present medical regimen.  6 coronary calcification-noted on CT scan.  He is intolerant to statins.  He is not on aspirin given need for apixaban.  [name], MD

## 2022-08-30 ENCOUNTER — Encounter: Payer: Self-pay | Admitting: Cardiology

## 2022-08-30 NOTE — Telephone Encounter (Signed)
Sample request for Eliquis received. Indication: Afib  Last office visit: 01/15/21 ([place])  Scr: 1.14 (03/09/21)  Age: 72 Weight: 103.2kg  Labs and office visit overdue. Pt has scheduled appt wit Dr Stanford Breed on 09/03/22.   Eliquis '5mg'$  BID is appropriate dose.

## 2022-08-30 NOTE — Telephone Encounter (Signed)
Medication samples have been provided to the patient.  Drug name: Eliquis '5mg'$  Qty: 3 boxes LOT: [id number]  Exp.Date: 04/2024  Samples given to Dr. Jacalyn Lefevre nurse for Friday's appointment.

## 2022-09-03 ENCOUNTER — Ambulatory Visit: Payer: Medicare Other | Attending: Cardiology | Admitting: Cardiology

## 2022-09-03 ENCOUNTER — Encounter: Payer: Self-pay | Admitting: Cardiology

## 2022-09-03 VITALS — BP 138/80 | HR 87 | Ht 72.0 in | Wt 212.0 lb

## 2022-09-03 DIAGNOSIS — I723 Aneurysm of iliac artery: Secondary | ICD-10-CM | POA: Diagnosis present

## 2022-09-03 DIAGNOSIS — E785 Hyperlipidemia, unspecified: Secondary | ICD-10-CM | POA: Diagnosis present

## 2022-09-03 DIAGNOSIS — I7121 Aneurysm of the ascending aorta, without rupture: Secondary | ICD-10-CM

## 2022-09-03 DIAGNOSIS — I4821 Permanent atrial fibrillation: Secondary | ICD-10-CM | POA: Diagnosis present

## 2022-09-03 DIAGNOSIS — I10 Essential (primary) hypertension: Secondary | ICD-10-CM | POA: Insufficient documentation

## 2022-09-03 NOTE — Patient Instructions (Signed)
  Lab Work:  Your physician recommends that you return for lab work PRIOR TO [place]  If you have labs (blood work) drawn today and your tests are completely normal, you will receive your results only by: [place] (if you have [place]) OR A paper copy in the mail If you have any lab test that is abnormal or we need to change your treatment, we will call you to review the results.   Testing/Procedures:  CTA OF THE CHEST TO FOLLOW UP THORACIC ANEURYSM AT [place]: At [facility], you and your health needs are our priority.  As part of our continuing mission to provide you with exceptional heart care, we have created designated Provider Care Teams.  These Care Teams include your primary Cardiologist (physician) and Advanced Practice Providers (APPs -  Physician Assistants and Nurse Practitioners) who all work together to provide you with the care you need, when you need it.  We recommend signing up for the patient portal called "MyChart".  Sign up information is provided on this After Visit Summary.  MyChart is used to connect with patients for Virtual Visits (Telemedicine).  Patients are able to view lab/test results, encounter notes, upcoming appointments, etc.  Non-urgent messages can be sent to your provider as well.   To learn more about what you can do with MyChart, go to [email or web address].    Your next appointment:   12 month(s)  Provider:   [name], MD

## 2022-11-16 ENCOUNTER — Encounter: Payer: Self-pay | Admitting: Cardiology

## 2022-11-19 ENCOUNTER — Other Ambulatory Visit: Payer: Self-pay

## 2022-11-19 MED ORDER — APIXABAN 5 MG PO TABS: 5.0000 mg | ORAL_TABLET | Freq: Two times a day (BID) | ORAL | 3 refills | Status: DC

## 2022-11-23 ENCOUNTER — Other Ambulatory Visit: Payer: Self-pay | Admitting: Cardiology

## 2022-11-23 DIAGNOSIS — E876 Hypokalemia: Secondary | ICD-10-CM

## 2022-11-24 ENCOUNTER — Telehealth: Payer: Self-pay | Admitting: *Deleted

## 2022-11-24 DIAGNOSIS — E876 Hypokalemia: Secondary | ICD-10-CM

## 2022-11-24 LAB — BASIC METABOLIC PANEL
BUN/Creatinine Ratio: 14 (ref 10–24)
BUN: 14 mg/dL (ref 8–27)
CO2: 26 mmol/L (ref 20–29)
Calcium: 10 mg/dL (ref 8.6–10.2)
Chloride: 89 mmol/L — ABNORMAL LOW (ref 96–106)
Creatinine, Ser: 1 mg/dL (ref 0.76–1.27)
Glucose: 100 mg/dL — ABNORMAL HIGH (ref 70–99)
Potassium: 3.4 mmol/L — ABNORMAL LOW (ref 3.5–5.2)
Sodium: 136 mmol/L (ref 134–144)
eGFR: 80 mL/min/{1.73_m2} (ref >59)

## 2022-11-24 MED ORDER — POTASSIUM CHLORIDE ER 10 MEQ PO TBCR: 20.0000 meq | EXTENDED_RELEASE_TABLET | Freq: Every day | ORAL | 3 refills | Status: DC

## 2022-11-24 NOTE — Telephone Encounter (Signed)
-----   Message from Lewayne Bunting, MD sent at 11/24/2022  7:27 AM EDT ----- Increase kdur to 20 meq daily; bmet 2 weeks Olga Millers

## 2022-11-24 NOTE — Telephone Encounter (Signed)
Spoke with pt, aware of results and medication changes. He will take 2 of the 10 meq tablets once daily. New script sent to the pharmacy and Lab orders mailed to the pt .

## 2022-11-29 ENCOUNTER — Other Ambulatory Visit (INDEPENDENT_AMBULATORY_CARE_PROVIDER_SITE_OTHER): Payer: Self-pay

## 2022-11-29 DIAGNOSIS — K2270 Barrett's esophagus without dysplasia: Secondary | ICD-10-CM

## 2022-11-29 DIAGNOSIS — K219 Gastro-esophageal reflux disease without esophagitis: Secondary | ICD-10-CM

## 2022-11-29 MED ORDER — PANTOPRAZOLE SODIUM 40 MG PO TBEC: DELAYED_RELEASE_TABLET | ORAL | 1 refills | Status: DC

## 2022-12-02 ENCOUNTER — Ambulatory Visit
Admission: RE | Admit: 2022-12-02 | Discharge: 2022-12-02 | Disposition: A | Discharge status: Home / Self Care | Payer: Medicare Other | Source: Ambulatory Visit | Attending: Cardiology | Admitting: Cardiology

## 2022-12-02 DIAGNOSIS — I7121 Aneurysm of the ascending aorta, without rupture: Secondary | ICD-10-CM

## 2022-12-02 MED ORDER — IOPAMIDOL (ISOVUE-370) INJECTION 76%
75.0000 mL | Freq: Once | INTRAVENOUS | Status: AC | PRN
  Administered 2022-12-02: 75 mL via INTRAVENOUS

## 2022-12-06 ENCOUNTER — Encounter: Payer: Self-pay | Admitting: *Deleted

## 2022-12-15 ENCOUNTER — Other Ambulatory Visit: Payer: Self-pay | Admitting: *Deleted

## 2022-12-15 DIAGNOSIS — I7121 Aneurysm of the ascending aorta, without rupture: Secondary | ICD-10-CM

## 2022-12-16 ENCOUNTER — Encounter: Payer: Self-pay | Admitting: *Deleted

## 2022-12-18 LAB — BASIC METABOLIC PANEL
BUN/Creatinine Ratio: 13 (ref 10–24)
BUN: 14 mg/dL (ref 8–27)
CO2: 30 mmol/L — ABNORMAL HIGH (ref 20–29)
Calcium: 9.7 mg/dL (ref 8.6–10.2)
Chloride: 94 mmol/L — ABNORMAL LOW (ref 96–106)
Creatinine, Ser: 1.04 mg/dL (ref 0.76–1.27)
Glucose: 112 mg/dL — ABNORMAL HIGH (ref 70–99)
Potassium: 3.8 mmol/L (ref 3.5–5.2)
Sodium: 138 mmol/L (ref 134–144)
eGFR: 76 mL/min/{1.73_m2} (ref >59)

## 2022-12-22 ENCOUNTER — Encounter: Payer: Self-pay | Admitting: *Deleted

## 2023-01-13 ENCOUNTER — Telehealth: Payer: Self-pay | Admitting: *Deleted

## 2023-01-13 NOTE — Telephone Encounter (Signed)
Spoke with pt, he had not wanted to schedule his AAA duplex because of the CT scan he had in may. Patient made aware the CT scan was for the TAA and did not go down far enough to see the abdominal aorta. Testing scheduled.

## 2023-01-16 ENCOUNTER — Other Ambulatory Visit: Payer: Self-pay | Admitting: Cardiology

## 2023-02-15 ENCOUNTER — Ambulatory Visit (HOSPITAL_COMMUNITY)
Admission: RE | Admit: 2023-02-15 | Discharge: 2023-02-15 | Disposition: A | Discharge status: Home / Self Care | Payer: Medicare Other | Source: Ambulatory Visit | Attending: Cardiology | Admitting: Cardiology

## 2023-02-15 DIAGNOSIS — I7121 Aneurysm of the ascending aorta, without rupture: Secondary | ICD-10-CM | POA: Insufficient documentation

## 2023-02-16 ENCOUNTER — Other Ambulatory Visit: Payer: Self-pay | Admitting: *Deleted

## 2023-02-16 DIAGNOSIS — I723 Aneurysm of iliac artery: Secondary | ICD-10-CM

## 2023-02-16 DIAGNOSIS — I7121 Aneurysm of the ascending aorta, without rupture: Secondary | ICD-10-CM

## 2023-02-19 ENCOUNTER — Other Ambulatory Visit: Payer: Self-pay | Admitting: Cardiology

## 2023-02-21 NOTE — Telephone Encounter (Signed)
Prescription refill request for Eliquis received. Indication:afib Last office visit:2/24 Scr:1.04  5/24 Age: 72 Weight:96.2  kg  Prescription refilled

## 2023-03-21 ENCOUNTER — Ambulatory Visit (INDEPENDENT_AMBULATORY_CARE_PROVIDER_SITE_OTHER): Payer: Medicare Other | Admitting: Gastroenterology

## 2023-03-28 ENCOUNTER — Encounter (INDEPENDENT_AMBULATORY_CARE_PROVIDER_SITE_OTHER): Payer: Self-pay | Admitting: Gastroenterology

## 2023-03-28 ENCOUNTER — Ambulatory Visit (INDEPENDENT_AMBULATORY_CARE_PROVIDER_SITE_OTHER): Payer: Medicare Other | Admitting: Gastroenterology

## 2023-03-28 VITALS — BP 144/91 | HR 67 | Temp 97.6°F | Ht 72.0 in | Wt 220.6 lb

## 2023-03-28 DIAGNOSIS — K2270 Barrett's esophagus without dysplasia: Secondary | ICD-10-CM

## 2023-03-28 DIAGNOSIS — K219 Gastro-esophageal reflux disease without esophagitis: Secondary | ICD-10-CM | POA: Diagnosis not present

## 2023-03-28 MED ORDER — PANTOPRAZOLE SODIUM 40 MG PO TBEC: 40.0000 mg | DELAYED_RELEASE_TABLET | Freq: Two times a day (BID) | ORAL | 3 refills | Status: DC

## 2023-03-28 NOTE — Progress Notes (Signed)
Referring Provider: Lorelei Pont, DO Primary Care Physician:  Lorelei Pont, DO Primary GI Physician: Dr. Levon Hedger   Chief Complaint  Patient presents with   Gastroesophageal Reflux    1 year follow up on GERD and Barrett's esophagus. Takes protonix 40mg  bid and states doing well.    HPI:   Andrew Compton is a 72 y.o. male with past medical history of GERD and long segment Barrett's esophagus, atrial fibrillation, hypertension, melanoma, hypothyroidism   Patient presenting today for follow up of GERD and Barrett's Esophagus   Last seen August 2023, at that time doing well on pantoprazole 40mg  BID, tried backing down to once daily but experienced breakthrough  Recommended to continue with PPI BID, EGD/Colonoscopy in 2027.  Present:  He is doing well with pantoprazole 40mg  BID. He has no breakthrough as long as he takes his medication. Previously tried backing down to once daily dosing and did well for about 1 month but then began having heartburn. Feels he can eat most anything he wants without issue at this time.  No red flag symptoms. Patient denies melena, hematochezia, nausea, vomiting, diarrhea, constipation, changes in bowel habits, dysphagia, odyonophagia, early satiety or weight loss.    Last EGD: 03/11/2021 - Normal hypopharynx. - Normal proximal esophagus and mid esophagus. - Esophageal mucosal changes secondary to established long-segment Barrett's disease. Barrett's length is 6 cm with proximal margin at 32 cm from the incisors. 4 squamous islands noted in the background of Barrett's mucosa.. Biopsied. - Z-line irregular, 38 cm from the incisors. - 3 cm hiatal hernia. - Normal stomach. - Normal duodenal bulb and second portion of the duodenum.   Last Colonoscopy: 03/11/2021 - Melanosis in the colon. - Two small polyps at the recto-sigmoid colon and in the transverse colon, removed with a cold snare. Resected and retrieved. - Two small polyps in the transverse  colon. Biopsied. - Diverticulosis in the sigmoid colon. - External hemorrhoids.   Path A. ESOPHAGUS, DISTAL, BIOPSY:  -  Barrett esophagus  -  No dysplasia or malignancy identified   B. ESOPHAGUS, MID, BIOPSY:  -  Barrett esophagus  -  No dysplasia or malignancy identified   C. ESOPHAGUS, PROXIMAL, BIOPSY:  -  Barrett esophagus  -  No dysplasia or malignancy identified   D. COLON, TRANSVERSE AND RECTOSIGMOID, POLYPECTOMY:  -  Tubular adenoma (4 of 4 fragments)  -  No high-grade dysplasia or malignancy identified    Recommended repeat EGD and colonoscopy in 5 years    Past Medical History:  Diagnosis Date   Acute on chronic respiratory failure with hypoxia (HCC)    Acute pancreatitis with uninfected necrosis, unspecified    Chronic atrial fibrillation (HCC)    Essential hypertension, benign    GERD (gastroesophageal reflux disease)    Gouty arthropathy, unspecified    Hypothyroidism    Melanoma (HCC)    Metabolic encephalopathy    Other and unspecified hyperlipidemia    Paroxysmal atrial fibrillation (HCC)    Pneumonia due to Pseudomonas aeruginosa (HCC)    Severe sepsis [facility])     Past Surgical History:  Procedure Laterality Date   BIOPSY N/A 03/11/2021   Procedure: BIOPSY;  Surgeon: Malissa Hippo, MD;  [facility];  Service: Endoscopy;  Laterality: N/A;   CHOLE[facility]Y  07/31/2019   [facility]   COLONOSCOPY WITH PROPOFOL N/A 03/11/2021   Procedure: COLONOSCOPY WITH PROPOFOL;  Surgeon: Malissa Hippo, MD;  [facility];  Service: Endoscopy;  Laterality:  N/A;  10:00   ESOPHAGEAL DILATION  01/15/2016   Procedure: ESOPHAGEAL DILATION;  Surgeon: Malissa Hippo, MD;  [facility];  Service: Endoscopy;;   ESOPHAGOGASTRODUODENOSCOPY N/A 01/15/2016   Procedure: ESOPHAGOGASTRODUODENOSCOPY (EGD);  Surgeon: Malissa Hippo, MD;  [facility];  Service: Endoscopy;  Laterality: N/A;  1030   ESOPHAGOGASTRODUODENOSCOPY  (EGD) WITH PROPOFOL N/A 03/11/2021   Procedure: ESOPHAGOGASTRODUODENOSCOPY (EGD) WITH PROPOFOL;  Surgeon: Malissa Hippo, MD;  [facility];  Service: Endoscopy;  Laterality: N/A;   FRACTURE SURGERY Left    wrist, multiple surgeries   MELANOMA EXCISION Left 07/2018   groin   NEUROLYSIS MEDIAN NERVE Right 11/27/2013   Neurolysis Brachial Plexus   POLYPECTOMY  03/11/2021   Procedure: POLYPECTOMY;  Surgeon: Malissa Hippo, MD;  [facility];  Service: Endoscopy;;   SHOULDER SURGERY Right     Current Outpatient Medications  Medication Sig Dispense Refill   colchicine 0.6 MG tablet Take 0.6 mg by mouth as needed.     diltiazem (TIAZAC) 360 MG 24 hr capsule TAKE 1 CAPSULE DAILY 90 capsule 1   ELIQUIS 5 MG TABS tablet TAKE 1 TABLET TWICE A DAY 180 tablet 3   fluticasone (FLONASE) 50 MCG/ACT nasal spray Place 1 spray into both nostrils daily as needed for allergies.     hydrochlorothiazide (HYDRODIURIL) 25 MG tablet Take 1 tablet (25 mg total) by mouth daily. 90 tablet 3   levothyroxine (SYNTHROID) 50 MCG tablet Take 1 tablet (50 mcg total) by mouth daily. 30 tablet 0   pantoprazole (PROTONIX) 40 MG tablet TAKE 1 TABLET BY MOUTH TWICE A DAY BEFORE MEALS 180 tablet 1   potassium chloride (KLOR-CON) 10 MEQ tablet Take 2 tablets (20 mEq total) by mouth daily. 180 tablet 3   Red Yeast Rice 600 MG CAPS Take 600 mg by mouth 2 (two) times daily.     tamsulosin (FLOMAX) 0.4 MG CAPS capsule Take 1 capsule (0.4 mg total) by mouth at bedtime. 30 capsule 0   No current facility-administered medications for this visit.    Allergies as of 03/28/2023 - Review Complete 03/28/2023  Allergen Reaction Noted   Azithromycin Hives 05/02/2013   Penicillins Other (See Comments) 09/24/2011   Levophed [norepinephrine bitartrate] Other (See Comments) 06/08/2013   Sulfa antibiotics Other (See Comments) 06/08/2013    Family History  Problem Relation Age of Onset   Breast cancer Mother     Other Father        unknown death cause   Diabetes Father     Social History   Socioeconomic History   Marital status: Married    Spouse name: Not on file   Number of children: Not on file   Years of education: Not on file   Highest education level: Not on file  Occupational History    Employer: [employer]    Comment: [employer] [place]  Tobacco Use   Smoking status: Former    Current packs/day: 0.00    Types: Cigarettes    Quit date: 08/02/1996    Years since quitting: 26.6    Passive exposure: Past   Smokeless tobacco: Never   Tobacco comments:    quit 20 years ago  Vaping Use   Vaping status: Never Used  Substance and Sexual Activity   Alcohol use: Yes    Comment: Occasional   Drug use: No   Sexual activity: Not Currently  Other Topics Concern   Not on file  Social History  Narrative   Has no children   Lives in [place]   Social Determinants of Health   Financial Resource Strain: Not on file  Food Insecurity: Not on file  Transportation Needs: Not on file  Physical Activity: Not on file  Stress: Not on file  Social Connections: Not on file   Review of systems General: negative for malaise, night sweats, fever, chills, weight loss Neck: Negative for lumps, goiter, pain and significant neck swelling Resp: Negative for cough, wheezing, dyspnea at rest CV: Negative for chest pain, leg swelling, palpitations, orthopnea GI: denies melena, hematochezia, nausea, vomiting, diarrhea, constipation, dysphagia, odyonophagia, early satiety or unintentional weight loss.  MSK: Negative for joint pain or swelling, back pain, and muscle pain. Derm: Negative for itching or rash Psych: Denies depression, anxiety, memory loss, confusion. No homicidal or suicidal ideation.  Heme: Negative for prolonged bleeding, bruising easily, and swollen nodes. Endocrine: Negative for cold or heat intolerance, polyuria, polydipsia and goiter. Neuro: negative for tremor, gait imbalance, syncope  and seizures. The remainder of the review of systems is noncontributory.  Physical Exam: BP (!) 144/91   Pulse 67   Temp 97.6 F (36.4 C) (Oral)   Ht 6' (1.829 m)   Wt 220 lb 9.6 oz (100.1 kg)   BMI 29.92 kg/m  General:   Alert and oriented. No distress noted. Pleasant and cooperative.  Head:  Normocephalic and atraumatic. Eyes:  Conjuctiva clear without scleral icterus. Mouth:  Oral mucosa pink and moist. Good dentition. No lesions. Heart: Normal rate and rhythm, s1 and s2 heart sounds present.  Lungs: Clear lung sounds in all lobes. Respirations equal and unlabored. Abdomen:  +BS, soft, non-tender and non-distended. No rebound or guarding. No HSM or masses noted. Derm: No palmar erythema or jaundice Msk:  Symmetrical without gross deformities. Normal posture. Extremities:  Without edema. Neurologic:  Alert and  oriented x4 Psych:  Alert and cooperative. Normal mood and affect.  Invalid input(s): "6 MONTHS"   ASSESSMENT: Andrew Compton is a 72 y.o. male presenting today for follow up of GERD and Barrett's esophagus  GERD: well managed with pantoprazole 40mg  BID, did not tolerate once daily dosing previously. No breakthrough on current regimen. Will continue with current PPI regimen, Good reflux precautions.  BE: last EGD with Barrett's esophagus with no dysplasia in 2022, repeat EGD recommended in 2027  Tubular Adenomas: last TCS in 2022 with 4 TAs, recommended repeat in 2027. Denies rectal bleeding, melena, changes in bowel habits.   PLAN:  Continue PPI BID  2 reflux precautions  3. EGD and Colonoscopy repeat in 2027  All questions were answered, patient verbalized understanding and is in agreement with plan as outlined above.   Follow Up: 1 year   Leshae Mcclay L. Jeanmarie Hubert, MSN, APRN, AGNP-C Adult-Gerontology Nurse Practitioner [facility]

## 2023-03-28 NOTE — Patient Instructions (Signed)
Please continue with pantoprazole 40mg  twice daily  Be mindful of greasy, spicy, fried, citrus foods, caffeine, carbonated drinks, chocolate and alcohol as these can increase reflux symptoms Stay upright 2-3 hours after eating, prior to lying down and avoid eating late in the evenings.  We will plan to repeat upper endoscopy and colonoscopy in 2027  Follow up 1 year, unless you have new or worsening GI symptoms  It was a pleasure to see you today. I want to create trusting relationships with patients and provide genuine, compassionate, and quality care. I truly value your feedback! please be on the lookout for a survey regarding your visit with me today. I appreciate your input about our visit and your time in completing this!    Analese Sovine L. Jeanmarie Hubert, MSN, APRN, AGNP-C Adult-Gerontology Nurse Practitioner [facility]

## 2023-03-30 NOTE — Progress Notes (Signed)
EGD recall changed to 03/2024

## 2023-06-24 ENCOUNTER — Telehealth: Payer: Self-pay | Admitting: Cardiology

## 2023-06-24 NOTE — Telephone Encounter (Signed)
Pt would like to switch from Dr. Jens Som to Dr. Diona Browner. Please advise

## 2023-07-08 ENCOUNTER — Other Ambulatory Visit: Payer: Self-pay | Admitting: Cardiology

## 2023-09-27 ENCOUNTER — Encounter: Payer: Self-pay | Admitting: Cardiology

## 2023-09-27 ENCOUNTER — Ambulatory Visit: Payer: Medicare Other | Attending: Cardiology | Admitting: Cardiology

## 2023-09-27 ENCOUNTER — Telehealth: Payer: Self-pay | Admitting: *Deleted

## 2023-09-27 VITALS — BP 142/88 | HR 77 | Ht 71.0 in | Wt 229.6 lb

## 2023-09-27 DIAGNOSIS — Z789 Other specified health status: Secondary | ICD-10-CM | POA: Insufficient documentation

## 2023-09-27 DIAGNOSIS — I10 Essential (primary) hypertension: Secondary | ICD-10-CM | POA: Diagnosis not present

## 2023-09-27 DIAGNOSIS — I723 Aneurysm of iliac artery: Secondary | ICD-10-CM | POA: Insufficient documentation

## 2023-09-27 DIAGNOSIS — E782 Mixed hyperlipidemia: Secondary | ICD-10-CM | POA: Diagnosis present

## 2023-09-27 DIAGNOSIS — I4821 Permanent atrial fibrillation: Secondary | ICD-10-CM | POA: Diagnosis not present

## 2023-09-27 NOTE — Patient Instructions (Addendum)
 Medication Instructions:  Your physician recommends that you continue on your current medications as directed. Please refer to the Current Medication list given to you today.  Labwork: Your physician recommends that you return for a FASTING lipid profile. Please do not eat or drink for at least 8 hours when you have this done. You may take your medications that morning with a sip of water.  Testing/Procedures: Your physician has requested that you have an abdominal aorta duplex. During this test, an ultrasound is used to evaluate the aorta. Allow 30 minutes for this exam. Do not eat after midnight the day before and avoid carbonated beverages.  Please note: We ask at that you not bring children with you during ultrasound (echo/ vascular) testing. Due to room size and safety concerns, children are not allowed in the ultrasound rooms during exams. Our front office staff cannot provide observation of children in our lobby area while testing is being conducted. An adult accompanying a patient to their appointment will only be allowed in the ultrasound room at the discretion of the ultrasound technician under special circumstances. We apologize for any inconvenience.  Follow-Up: Your physician recommends that you schedule a follow-up appointment in: 1 year. You will receive a reminder call in about 10 months reminding you to schedule your appointment. If you don't receive this call, please contact our office.  Any Other Special Instructions Will Be Listed Below (If Applicable).  If you need a refill on your cardiac medications before your next appointment, please call your pharmacy.

## 2023-09-27 NOTE — Progress Notes (Addendum)
 Cardiology Office Note  Date: 09/27/2023   ID: Trevaughn, Schear 1950/09/09, MRN [id number]  History of Present Illness: Andrew Compton is a 73 y.o. male former patient of Dr. Jens Som now presenting to establish follow-up with me.  I reviewed his records.  He was last seen in February 2024.  He is here for a routine visit, reports no exertional chest pain or unusual shortness of breath, no sense of palpitations, no dizziness or syncope.  No abdominal pain.  I reviewed his medications.  Current regimen includes diltiazem CD 360 mg daily, Eliquis 5 mg twice daily, HCTZ 25 mg daily, KCl 10 mEq once daily, and red yeast rice 600 mg daily.  He has a history of statin intolerance (leg/joint stiffness).  He was able to pull up lipid panel from last year indicating cholesterol 163, triglycerides 107, HDL 45, and LDL 97.  Blood pressure was elevated today, rechecked by me in the left arm at 142/88.  He states that he does have a home blood pressure cuff, but has not been following it indicating generally good measurements when he has it checked at other healthcare providers.  It sounds like he had been on lisinopril in the past as well.  His ECG today which shows rate controlled atrial fibrillation, Q in lead III which is old.  Physical Exam: VS:  BP (!) 142/88 (BP Location: Left Arm)   Pulse 77   Ht 5\' 11"  (1.803 m)   Wt 229 lb 9.6 oz (104.1 kg)   SpO2 94%   BMI 32.02 kg/m , BMI Body mass index is 32.02 kg/m.  Wt Readings from Last 3 Encounters:  09/27/23 229 lb 9.6 oz (104.1 kg)  03/28/23 220 lb 9.6 oz (100.1 kg)  09/03/22 212 lb (96.2 kg)    General: Patient appears comfortable at rest. HEENT: Conjunctiva and lids normal. Neck: Supple, no elevated JVP or carotid bruits. Lungs: Clear to auscultation, nonlabored breathing at rest. Cardiac: Irregularly irregular, no significant murmur or gallop. Abdomen: Bowel sounds present, no bruits. Extremities: No pitting edema.  ECG:   An ECG dated 09/03/2022 was personally reviewed today and demonstrated:  Atrial fibrillation.  Labwork: June 2022: Cholesterol 160, triglycerides 101, HDL 37, LDL 104 12/17/2022: BUN 14; Creatinine, Ser 1.04; Potassium 3.8; Sodium 138   Other Studies Reviewed Today:  No interval cardiac testing for review today.  Assessment and Plan:  1.  Permanent atrial fibrillation.  CHA2DS2-VASc score is 3.  Asymptomatic in terms of palpitations and with good heart rate control on Cardizem CD 360 mg daily.  Continue Eliquis 5 mg twice daily for stroke prophylaxis.  He does not report any spontaneous bleeding problems.  2.  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  Echocardiogram in February 2022 revealed LVEF 50 to 55%.  3.  Primary hypertension.  Blood pressure elevated today.  I asked him to check measurements with his home cuff, if blood pressure is trending up he will likely need additional therapy such as ARB.  Already on HCTZ and relatively high dose Cardizem CD.  4.  Coronary artery calcification by CT imaging.  Asymptomatic.  5.  Mixed hyperlipidemia with statin intolerance.  Taking red yeast rice 600 mg daily.  We discussed other nonstatin options for treatment.  LDL last year was 97.  Would discuss with PCP regarding possibility of trial of Zetia 10 mg daily or bempedoic acid 180 mg daily since he does have evidence of vascular disease  evident as arterial calcification on CT imaging..  6.  Iliac artery aneurysm.  Follow-up ultrasound in July 2024 showed no evidence of abdominal aortic aneurysm with relatively stable 2.8 cm largest diameter of right and left common iliac arteries.  Obtain follow-up imaging in July of this year.  Disposition:  Follow up  1 year.  Signed, Jonelle Sidle, M.D., F.A.C.C. [facility]

## 2023-09-27 NOTE — Telephone Encounter (Signed)
 Per Diona Browner: Mixed hyperlipidemia with statin intolerance.  Taking red yeast rice 600 mg daily.  We discussed other nonstatin options for treatment.  LDL last year was 97.  Would discuss with PCP regarding possibility of trial of Zetia 10 mg daily or bempedoic acid 180 mg daily since he does have evidence of vascular disease evident as arterial calcification on CT imaging..   Patient informed and verbalized understanding of plan. Will send in a mychart message for patient reference.

## 2023-10-13 ENCOUNTER — Telehealth: Payer: Self-pay | Admitting: Cardiology

## 2023-10-13 LAB — LIPID PANEL
Chol/HDL Ratio: 3.9 ratio (ref 0.0–5.0)
Cholesterol, Total: 149 mg/dL (ref 100–199)
HDL: 38 mg/dL — ABNORMAL LOW (ref >39)
LDL Chol Calc (NIH): 88 mg/dL (ref 0–99)
Triglycerides: 125 mg/dL (ref 0–149)
VLDL Cholesterol Cal: 23 mg/dL (ref 5–40)

## 2023-10-13 LAB — SPECIMEN STATUS REPORT

## 2023-10-13 NOTE — Telephone Encounter (Signed)
 The patient has been notified of the result and verbalized understanding.  All questions (if any) were answered. Roseanne Reno, [facility] 10/13/2023 9:53 AM

## 2023-10-13 NOTE — Telephone Encounter (Signed)
 Pt returning call regarding results. Please advise

## 2023-11-04 ENCOUNTER — Encounter: Payer: Self-pay | Admitting: *Deleted

## 2023-11-14 ENCOUNTER — Telehealth: Payer: Self-pay | Admitting: Cardiology

## 2023-11-14 ENCOUNTER — Telehealth: Payer: Self-pay | Admitting: *Deleted

## 2023-11-14 DIAGNOSIS — I7121 Aneurysm of the ascending aorta, without rupture: Secondary | ICD-10-CM

## 2023-11-14 NOTE — Telephone Encounter (Signed)
 Contacted to advise of BMET needed within 6 weeks for CT Angio Chest & Aorta. Says he is having lab work on 11/24/2023 at urologist office (Dr. Hollis Lurie) and would like to have it done at that time. Contacted urology office and lab being done on 11/24/2023 is PSA total.  Spoke with Josephine Nicolas, [facility] at urology office and says she is only allowed to do test ordered by providers in the urology office.  Patient informed and says he will have bmet done at his PCP office. Lab order mailed to home address.

## 2023-11-14 NOTE — Telephone Encounter (Signed)
-----   Message from Teddie Favre sent at 11/10/2023  4:12 PM EDT ----- Yes that's fine. ----- Message ----- From: Render Carrie, RN Sent: 11/10/2023   4:05 PM EDT To: Rogene Claude, RN; Gerard Knight, MD  This patient has changed to Dr Londa Rival from [place] and I have him down for a CTA of there chest to follow up on his thoracic aneurysm. Just want to check and make sure okay to order under Dr Londa Rival. Thanks.

## 2023-11-14 NOTE — Telephone Encounter (Signed)
 Checking percert on the following patient for testing scheduled at [facility].    CT Angio Chest Aorta w/cm & or    12/02/2023

## 2023-11-23 ENCOUNTER — Other Ambulatory Visit: Payer: Self-pay | Admitting: Cardiology

## 2023-11-24 LAB — BASIC METABOLIC PANEL WITH GFR
BUN/Creatinine Ratio: 14 (ref 10–24)
BUN: 14 mg/dL (ref 8–27)
CO2: 29 mmol/L (ref 20–29)
Calcium: 9.4 mg/dL (ref 8.6–10.2)
Chloride: 95 mmol/L — ABNORMAL LOW (ref 96–106)
Creatinine, Ser: 0.99 mg/dL (ref 0.76–1.27)
Glucose: 143 mg/dL — ABNORMAL HIGH (ref 70–99)
Potassium: 3.4 mmol/L — ABNORMAL LOW (ref 3.5–5.2)
Sodium: 138 mmol/L (ref 134–144)
eGFR: 80 mL/min/{1.73_m2} (ref >59)

## 2023-11-24 LAB — SPECIMEN STATUS REPORT

## 2023-11-25 ENCOUNTER — Telehealth: Payer: Self-pay | Admitting: *Deleted

## 2023-11-25 DIAGNOSIS — E876 Hypokalemia: Secondary | ICD-10-CM

## 2023-11-25 MED ORDER — POTASSIUM CHLORIDE ER 10 MEQ PO TBCR: 30.0000 meq | EXTENDED_RELEASE_TABLET | Freq: Every day | ORAL | 3 refills | Status: DC

## 2023-11-25 NOTE — Telephone Encounter (Signed)
 Patient informed and verbalized understanding of plan. Copy sent to PCP

## 2023-11-25 NOTE — Telephone Encounter (Signed)
-----   Message from Andrew Compton sent at 11/24/2023  4:55 PM EDT ----- Results reviewed.  Pre-CT scan BMET obtained.  Potassium mildly low at 3.4.  If he is taking KCl 20 mEq daily as listed, would increase to 30 mEq daily.  Renal function otherwise normal.

## 2023-11-26 ENCOUNTER — Other Ambulatory Visit: Payer: Self-pay | Admitting: Cardiology

## 2023-11-26 DIAGNOSIS — E876 Hypokalemia: Secondary | ICD-10-CM

## 2023-12-02 ENCOUNTER — Ambulatory Visit (HOSPITAL_COMMUNITY)
Admission: RE | Admit: 2023-12-02 | Discharge: 2023-12-02 | Disposition: A | Discharge status: Home / Self Care | Source: Ambulatory Visit | Attending: Cardiology | Admitting: Cardiology

## 2023-12-02 DIAGNOSIS — I7121 Aneurysm of the ascending aorta, without rupture: Secondary | ICD-10-CM

## 2023-12-02 MED ORDER — IOHEXOL 350 MG/ML SOLN
100.0000 mL | Freq: Once | INTRAVENOUS | Status: AC | PRN
  Administered 2023-12-02: 100 mL via INTRAVENOUS

## 2023-12-13 ENCOUNTER — Other Ambulatory Visit: Payer: Self-pay

## 2023-12-13 DIAGNOSIS — E876 Hypokalemia: Secondary | ICD-10-CM

## 2023-12-13 MED ORDER — POTASSIUM CHLORIDE ER 10 MEQ PO TBCR: 30.0000 meq | EXTENDED_RELEASE_TABLET | Freq: Every day | ORAL | 3 refills | Status: AC

## 2024-02-13 ENCOUNTER — Other Ambulatory Visit: Payer: Medicare Other

## 2024-02-17 ENCOUNTER — Encounter (INDEPENDENT_AMBULATORY_CARE_PROVIDER_SITE_OTHER): Payer: Self-pay | Admitting: *Deleted

## 2024-02-28 ENCOUNTER — Ambulatory Visit: Attending: Cardiology

## 2024-02-28 DIAGNOSIS — I723 Aneurysm of iliac artery: Secondary | ICD-10-CM | POA: Insufficient documentation

## 2024-02-28 DIAGNOSIS — I10 Essential (primary) hypertension: Secondary | ICD-10-CM

## 2024-02-28 DIAGNOSIS — I728 Aneurysm of other specified arteries: Secondary | ICD-10-CM | POA: Diagnosis not present

## 2024-02-29 ENCOUNTER — Ambulatory Visit: Payer: Self-pay | Admitting: Cardiology

## 2024-03-27 ENCOUNTER — Encounter (INDEPENDENT_AMBULATORY_CARE_PROVIDER_SITE_OTHER): Payer: Self-pay | Admitting: Gastroenterology

## 2024-03-27 ENCOUNTER — Ambulatory Visit (INDEPENDENT_AMBULATORY_CARE_PROVIDER_SITE_OTHER): Payer: Medicare Other | Admitting: Gastroenterology

## 2024-03-27 DIAGNOSIS — K219 Gastro-esophageal reflux disease without esophagitis: Secondary | ICD-10-CM

## 2024-03-27 DIAGNOSIS — K2270 Barrett's esophagus without dysplasia: Secondary | ICD-10-CM

## 2024-03-27 MED ORDER — PANTOPRAZOLE SODIUM 40 MG PO TBEC: 40.0000 mg | DELAYED_RELEASE_TABLET | Freq: Two times a day (BID) | ORAL | 3 refills | Status: AC

## 2024-03-27 NOTE — Progress Notes (Signed)
 Referring Provider: Henriette Anes, DO Primary Care Physician:  Henriette Anes, DO Primary GI Physician: Dr. Eartha   Chief Complaint  Patient presents with   Follow-up    Pt arrives for follow up. Pt states no issues, doing well. Taking meds as directed.    HPI:   Andrew Compton is a 73 y.o. male with past medical history of GERD and long segment Barrett's esophagus, atrial fibrillation, hypertension, melanoma, hypothyroidism   Patient presenting today for follow up of: GERD  Barrett's esophagus  Last seen August 2024, at that time doing well on protonix  40mg  BID. No breakthrough. Tried backing down to once daily PPI but had heartburn so resumed BID dosing  Recommended to continue PPI BID, reflux precautions, EGD repeat in 2025, repeat Colonoscopy 2027  Present:  Patient states he is doing well on PPI BID. Symptoms are well controlled without breakthrough. Denies any dysphagia odynophagia. No red flag symptoms. Patient denies melena, hematochezia, nausea, vomiting, diarrhea, constipation, dysphagia, odyonophagia, early satiety or weight loss.    Last EGD: 03/11/2021 - Normal hypopharynx. - Normal proximal esophagus and mid esophagus. - Esophageal mucosal changes secondary to established long-segment Barrett's disease. Barrett's length is 6 cm with proximal margin at 32 cm from the incisors. 4 squamous islands noted in the background of Barrett's mucosa.. Biopsied. - Z-line irregular, 38 cm from the incisors. - 3 cm hiatal hernia. - Normal stomach. - Normal duodenal bulb and second portion of the duodenum.   Last Colonoscopy: 03/11/2021 - Melanosis in the colon. - Two small polyps at the recto-sigmoid colon and in the transverse colon, removed with a cold snare. Resected and retrieved. - Two small polyps in the transverse colon. Biopsied. - Diverticulosis in the sigmoid colon. - External hemorrhoids.   Path A. ESOPHAGUS, DISTAL, BIOPSY:  -  Barrett esophagus  -  No  dysplasia or malignancy identified   B. ESOPHAGUS, MID, BIOPSY:  -  Barrett esophagus  -  No dysplasia or malignancy identified   C. ESOPHAGUS, PROXIMAL, BIOPSY:  -  Barrett esophagus  -  No dysplasia or malignancy identified   D. COLON, TRANSVERSE AND RECTOSIGMOID, POLYPECTOMY:  -  Tubular adenoma (4 of 4 fragments)  -  No high-grade dysplasia or malignancy identified    Recommended repeat EGD 3 year and colonoscopy in 5 years  Filed Weights   03/27/24 1031  Weight: 219 lb 11.2 oz (99.7 kg)     Past Medical History:  Diagnosis Date   Acute on chronic respiratory failure with hypoxia (HCC)    Acute pancreatitis with uninfected necrosis, unspecified    Chronic atrial fibrillation (HCC)    Essential hypertension, benign    GERD (gastroesophageal reflux disease)    Gouty arthropathy, unspecified    Hypothyroidism    Melanoma (HCC)    Metabolic encephalopathy    Other and unspecified hyperlipidemia    Paroxysmal atrial fibrillation (HCC)    Pneumonia due to Pseudomonas aeruginosa (HCC)    Severe sepsis [facility])     Past Surgical History:  Procedure Laterality Date   BIOPSY N/A 03/11/2021   Procedure: BIOPSY;  Surgeon: Golda Claudis PENNER, MD;  [facility];  Service: Endoscopy;  Laterality: N/A;   CHOLECYSTECTOMY  07/31/2019   [facility]   COLONOSCOPY WITH PROPOFOL  N/A 03/11/2021   Procedure: COLONOSCOPY WITH PROPOFOL ;  Surgeon: Golda Claudis PENNER, MD;  [facility];  Service: Endoscopy;  Laterality: N/A;  10:00   ESOPHAGEAL DILATION  01/15/2016   Procedure:  ESOPHAGEAL DILATION;  Surgeon: Claudis RAYMOND Rivet, MD;  [facility];  Service: Endoscopy;;   ESOPHAGOGASTRODUODENOSCOPY N/A 01/15/2016   Procedure: ESOPHAGOGASTRODUODENOSCOPY (EGD);  Surgeon: Claudis RAYMOND Rivet, MD;  [facility];  Service: Endoscopy;  Laterality: N/A;  1030   ESOPHAGOGASTRODUODENOSCOPY (EGD) WITH PROPOFOL  N/A 03/11/2021   Procedure: ESOPHAGOGASTRODUODENOSCOPY  (EGD) WITH PROPOFOL ;  Surgeon: Rivet Claudis RAYMOND, MD;  [facility];  Service: Endoscopy;  Laterality: N/A;   FRACTURE SURGERY Left    wrist, multiple surgeries   MELANOMA EXCISION Left 07/2018   groin   NEUROLYSIS MEDIAN NERVE Right 11/27/2013   Neurolysis Brachial Plexus   POLYPECTOMY  03/11/2021   Procedure: POLYPECTOMY;  Surgeon: Rivet Claudis RAYMOND, MD;  [facility];  Service: Endoscopy;;   SHOULDER SURGERY Right     Current Outpatient Medications  Medication Sig Dispense Refill   colchicine  0.6 MG tablet Take 0.6 mg by mouth as needed.     diltiazem  (TIAZAC ) 360 MG 24 hr capsule TAKE 1 CAPSULE DAILY 90 capsule 1   ELIQUIS  5 MG TABS tablet TAKE 1 TABLET TWICE A DAY 180 tablet 3   fluticasone  (FLONASE ) 50 MCG/ACT nasal spray Place 1 spray into both nostrils daily as needed for allergies.     hydrochlorothiazide  (HYDRODIURIL ) 25 MG tablet Take 1 tablet (25 mg total) by mouth daily. 90 tablet 3   levothyroxine  (SYNTHROID ) 75 MCG tablet Take 75 mcg by mouth daily.     pantoprazole  (PROTONIX ) 40 MG tablet Take 1 tablet (40 mg total) by mouth 2 (two) times daily. TAKE 1 TABLET BY MOUTH TWICE A DAY BEFORE MEALS 180 tablet 3   potassium chloride  (KLOR-CON ) 10 MEQ tablet Take 3 tablets (30 mEq total) by mouth daily. 270 tablet 3   Red Yeast Rice 600 MG CAPS Take 600 mg by mouth 2 (two) times daily.     tamsulosin  (FLOMAX ) 0.4 MG CAPS capsule Take 1 capsule (0.4 mg total) by mouth at bedtime. 30 capsule 0   No current facility-administered medications for this visit.    Allergies as of 03/27/2024 - Review Complete 03/27/2024  Allergen Reaction Noted   Azithromycin Hives 05/02/2013   Penicillins Other (See Comments) 09/24/2011   Levophed [norepinephrine bitartrate] Other (See Comments) 06/08/2013   Sulfa antibiotics Other (See Comments) 06/08/2013    Social History   Socioeconomic History   Marital status: Married    Spouse name: Not on file   Number of children:  Not on file   Years of education: Not on file   Highest education level: Not on file  Occupational History    Employer: [employer]    Comment: Chief Strategy Officer [employer]  Tobacco Use   Smoking status: Former    Current packs/day: 0.00    Types: Cigarettes    Quit date: 08/02/1996    Years since quitting: 27.6    Passive exposure: Past   Smokeless tobacco: Never   Tobacco comments:    quit 20 years ago  Vaping Use   Vaping status: Never Used  Substance and Sexual Activity   Alcohol use: Yes    Comment: Occasional   Drug use: No   Sexual activity: Not Currently  Other Topics Concern   Not on file  Social History Narrative   Has no children   Lives in [place]    Social Drivers of Health   Financial Resource Strain: Not on file  Food Insecurity: Not on file  Transportation Needs: Not on file  Physical Activity:  Not on file  Stress: Not on file  Social Connections: Not on file    Review of systems General: negative for malaise, night sweats, fever, chills, weight loss Neck: Negative for lumps, goiter, pain and significant neck swelling Resp: Negative for cough, wheezing, dyspnea at rest CV: Negative for chest pain, leg swelling, palpitations, orthopnea GI: denies melena, hematochezia, nausea, vomiting, diarrhea, constipation, dysphagia, odyonophagia, early satiety or unintentional weight loss.  MSK: Negative for joint pain or swelling, back pain, and muscle pain. Derm: Negative for itching or rash Psych: Denies depression, anxiety, memory loss, confusion. No homicidal or suicidal ideation.  Heme: Negative for prolonged bleeding, bruising easily, and swollen nodes. Endocrine: Negative for cold or heat intolerance, polyuria, polydipsia and goiter. Neuro: negative for tremor, gait imbalance, syncope and seizures. The remainder of the review of systems is noncontributory.  Physical Exam: BP 133/78   Pulse 83   Temp (!) 97.5 F (36.4 C)   Ht 6' (1.829 m)   Wt 219 lb 11.2 oz (99.7  kg)   BMI 29.80 kg/m  General:   Alert and oriented. No distress noted. Pleasant and cooperative.  Head:  Normocephalic and atraumatic. Eyes:  Conjuctiva clear without scleral icterus. Mouth:  Oral mucosa pink and moist. Good dentition. No lesions. Heart: Normal rate and rhythm, s1 and s2 heart sounds present.  Lungs: Clear lung sounds in all lobes. Respirations equal and unlabored. Abdomen:  +BS, soft, non-tender and non-distended. No rebound or guarding. No HSM or masses noted. Derm: No palmar erythema or jaundice Msk:  Symmetrical without gross deformities. Normal posture. Extremities:  Without edema. Neurologic:  Alert and  oriented x4 Psych:  Alert and cooperative. Normal mood and affect.  Invalid input(s): 6 MONTHS   ASSESSMENT: Andrew Compton is a 73 y.o. male presenting today for follow up of GERD and Long segment Barrett's esophagus   GERD: well managed with pantoprazole  40mg  BID, did not tolerate once daily dosing previously. No breakthrough on current regimen, no dysphagia or odynophagia. Will continue with current PPI regimen, Good reflux precautions.   Long segment BE: last EGD with Barrett's esophagus with no dysplasia in 2022, given long segment BE would recommend repeat EGD in 03/2024 (3 year interval) instead of previously recommended 5. 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 Patient does not want to schedule at this time and wishes to wait until January. We will place him on recall list to hopefully schedule him around this time.    PLAN:  -continue PPI BID -schedule EGD for LSBE surveillance (ASA III) needs clearance for eliquis  hold, pt prefers to schedule in January   All questions were answered, patient verbalized understanding and is in agreement with plan as outlined above.    Follow Up: 1 year   Juan Olthoff L. Mariette, MSN, APRN, AGNP-C Adult-Gerontology Nurse Practitioner [facility]  I have reviewed the  note and agree with the APP's assessment as described in this progress note  Toribio Fortune, MD Gastroenterology and Hepatology [facility]

## 2024-03-27 NOTE — Patient Instructions (Signed)
 We will continue with protonix  40mg  twice daily Be mindful greasy, spicy, fried, citrus foods, and be mindful that caffeine, carbonated drinks, chocolate and alcohol can increase reflux symptoms Stay upright 2-3 hours after eating, prior to lying down and avoid eating late in the evenings.  We will touch base in January about scheduling upper endoscopy for your barrett's esophagus  Follow up 1 year  It was a pleasure to see you today. I want to create trusting relationships with patients and provide genuine, compassionate, and quality care. I truly value your feedback! please be on the lookout for a survey regarding your visit with me today. I appreciate your input about our visit and your time in completing this!    Andrew Compton L. Ruqayya Ventress, MSN, APRN, AGNP-C Adult-Gerontology Nurse Practitioner [facility] at [facility]

## 2024-04-07 ENCOUNTER — Other Ambulatory Visit: Payer: Self-pay | Admitting: Cardiology

## 2024-04-09 NOTE — Telephone Encounter (Signed)
 Prescription refill request for Eliquis  received. Indication:afib Last office visit:2/25 Scr:0.99  4/25 Age: 73 Weight:99.7  kg  Prescription refilled

## 2024-05-16 ENCOUNTER — Encounter (INDEPENDENT_AMBULATORY_CARE_PROVIDER_SITE_OTHER): Payer: Self-pay | Admitting: Gastroenterology

## 2024-07-20 LAB — LAB REPORT - SCANNED
A1c: 6.8
Albumin, Urine POC: 6.6
Albumin/Creatinine Ratio, Urine, POC: 10
Creatinine, POC: 67.5 mg/dL
EGFR: 89
TSH: 2.24 (ref 0.41–5.90)

## 2024-07-23 ENCOUNTER — Telehealth: Payer: Self-pay | Admitting: *Deleted

## 2024-07-23 NOTE — Telephone Encounter (Signed)
" °  Request for patient to stop medication prior to procedure or is needing cleareance  07/23/2024  Andrew Compton [birth year]  What type of surgery is being performed? Esophagogastroduodenoscopy (EGD)   When is surgery scheduled? TBD  What type of clearance is required (medical or pharmacy to hold medication or both? medication  Are there any medications that need to be held prior to surgery and how long? Eliquis  x 2 days   Name of physician performing surgery?  Dr.Ahmed Raynaldo Gastroenter[facility] at [employer]: [phone], option 5 Fax: [phone]  Anesthesia type (none, local, MAC, general)? MAC   ? Yes ? No Patient can hold medication as requested   Signature: ___________________________   "

## 2024-07-23 NOTE — Telephone Encounter (Signed)
 Left message for PCP office to call back and let us  know if they have any more recent labs for CBC and BMET than 11/2023, see notes from preop APP Raphael Bring, PAC.   Left message if PCP has more recent labs to please fax to [phone] preop team, I did ask for a call back as well.   If not recent labs will call pt to schedule labs .

## 2024-07-23 NOTE — Telephone Encounter (Signed)
 Will route to pharm for input on anticoag - 07/20/24 Creatinine 0.91, 01/2024 Hgb 13.5, plt 196,  [facility] triage team - we requested updated labs for preop team review as last labs showed hypokalemia earlier this year. Per review of PCP labs from lat week, patient's potassium remained low on the labs they checked. Do not have access to their other records otherwise; would reach out to make sure that primary care is addressing/following low potassium level and plans to recheck to ensure normal prior to procedure. If not, will plan to involve primary cardiologist for review as hypokalemia has been a frequent issue.

## 2024-07-23 NOTE — Telephone Encounter (Signed)
 Will route to callback to find out if patient has had more updated labs this year with PCP - CBC and BMET. Last labs 11/2023 showed a low potassium level, looking to see if he has had recheck, and no recent CBC on file. If not, please ask to return non-fasting and make sure to take potassium supplement at least 3 hours before labs drawn.  Then we will plan to route to pharm for input. Likely benefit from cold call ([place]  pt) to ensure no outside cardioversion/ablation procedures prior to finalizing.

## 2024-07-24 NOTE — Telephone Encounter (Signed)
 Will send back to preop APP to see notes from Dayna Dunn, [facility] about cold call as pt lives in [place].

## 2024-07-24 NOTE — Telephone Encounter (Signed)
 Patient with diagnosis of atrial fibrillation on Eliquis  for anticoagulation.    What type of surgery is being performed? Esophagogastroduodenoscopy (EGD)    When is surgery scheduled? TBD   CHA2DS2-VASc Score = 4   This indicates a 4.8% annual risk of stroke. The patient's score is based upon: CHF History: 1 HTN History: 1 Diabetes History: 1 Stroke History: 0 Vascular Disease History: 0 Age Score: 1 Gender Score: 0  CrCl 87 ml/min Scr (07/2024) Platelet count 196 K (01/2024)  Patient has not had an Afib/aflutter ablation in the last 3 months, DCCV within the last 4 weeks or a watchman implanted in the last 45 days   Per office protocol, patient can hold Eliquis  for 2 days prior to procedure.    Patient will not need bridging with Lovenox (enoxaparin) around procedure.  **This guidance is not considered finalized until pre-operative APP has relayed final recommendations.**

## 2024-07-25 NOTE — Telephone Encounter (Signed)
 Clearance is unclear if it will be in office or a telephone visit. The patient is asking if we can do his clearance when he sees Dr. Debera in February.

## 2024-07-25 NOTE — Telephone Encounter (Signed)
 Attempted to contact patient as part of preoperative protocol.  Left voice message.  Will attempt to call back at later date and time.  Josefa HERO. Ural Acree NP-C     07/25/2024, 9:05 AM [facility] [address] [place], [place] [address] Office [phone]

## 2024-07-25 NOTE — Telephone Encounter (Signed)
Pt returning call, please advise.

## 2024-08-16 NOTE — Telephone Encounter (Signed)
 Pt has appointment with cardiology on 09/24/24.

## 2024-08-16 NOTE — Telephone Encounter (Signed)
 noted

## 2024-09-24 ENCOUNTER — Ambulatory Visit: Admitting: Cardiology
